# Patient Record
Sex: Male | Born: 1955 | Race: White | Hispanic: No | Marital: Married | Smoking: Never smoker
Health system: Southern US, Community
[De-identification: ages and names within clinical notes are randomized; demographics above are authoritative.]

## PROBLEM LIST (undated history)

## (undated) DIAGNOSIS — M109 Gout, unspecified: Secondary | ICD-10-CM

## (undated) DIAGNOSIS — Z87442 Personal history of urinary calculi: Secondary | ICD-10-CM

## (undated) DIAGNOSIS — E1129 Type 2 diabetes mellitus with other diabetic kidney complication: Secondary | ICD-10-CM

## (undated) DIAGNOSIS — K760 Fatty (change of) liver, not elsewhere classified: Secondary | ICD-10-CM

## (undated) DIAGNOSIS — E119 Type 2 diabetes mellitus without complications: Secondary | ICD-10-CM

## (undated) DIAGNOSIS — Z8669 Personal history of other diseases of the nervous system and sense organs: Secondary | ICD-10-CM

## (undated) DIAGNOSIS — K8000 Calculus of gallbladder with acute cholecystitis without obstruction: Secondary | ICD-10-CM

## (undated) DIAGNOSIS — E559 Vitamin D deficiency, unspecified: Secondary | ICD-10-CM

## (undated) HISTORY — DX: Personal history of urinary calculi: Z87.442

## (undated) HISTORY — DX: Type 2 diabetes mellitus without complications: E11.9

## (undated) HISTORY — DX: Personal history of other diseases of the nervous system and sense organs: Z86.69

## (undated) HISTORY — DX: Vitamin D deficiency, unspecified: E55.9

## (undated) HISTORY — PX: VARICOCELE EXCISION: SUR582

## (undated) HISTORY — DX: Type 2 diabetes mellitus with other diabetic kidney complication: E11.29

## (undated) HISTORY — PX: TOE SURGERY: SHX1073

## (undated) HISTORY — DX: Calculus of gallbladder with acute cholecystitis without obstruction: K80.00

## (undated) HISTORY — PX: NASAL SINUS SURGERY: SHX719

## (undated) HISTORY — DX: Gout, unspecified: M10.9

## (undated) HISTORY — DX: Fatty (change of) liver, not elsewhere classified: K76.0

---

## 1998-10-02 ENCOUNTER — Encounter: Payer: Self-pay | Admitting: Internal Medicine

## 1998-10-02 ENCOUNTER — Ambulatory Visit (HOSPITAL_COMMUNITY): Admission: RE | Admit: 1998-10-02 | Discharge: 1998-10-02 | Payer: Self-pay | Admitting: Internal Medicine

## 1999-05-12 ENCOUNTER — Emergency Department (HOSPITAL_COMMUNITY): Admission: EM | Admit: 1999-05-12 | Discharge: 1999-05-12 | Payer: Self-pay | Admitting: Emergency Medicine

## 1999-05-12 ENCOUNTER — Encounter: Payer: Self-pay | Admitting: Emergency Medicine

## 1999-05-30 ENCOUNTER — Encounter: Payer: Self-pay | Admitting: Endocrinology

## 1999-05-30 ENCOUNTER — Ambulatory Visit (HOSPITAL_COMMUNITY): Admission: RE | Admit: 1999-05-30 | Discharge: 1999-05-30 | Payer: Self-pay | Admitting: Endocrinology

## 1999-09-08 ENCOUNTER — Ambulatory Visit (HOSPITAL_COMMUNITY): Admission: RE | Admit: 1999-09-08 | Discharge: 1999-09-08 | Payer: Self-pay | Admitting: Internal Medicine

## 1999-09-08 ENCOUNTER — Encounter: Payer: Self-pay | Admitting: Internal Medicine

## 2000-03-16 ENCOUNTER — Ambulatory Visit (HOSPITAL_COMMUNITY): Admission: RE | Admit: 2000-03-16 | Discharge: 2000-03-16 | Payer: Self-pay | Admitting: Surgery

## 2000-03-16 ENCOUNTER — Encounter: Payer: Self-pay | Admitting: Surgery

## 2001-09-09 ENCOUNTER — Ambulatory Visit (HOSPITAL_COMMUNITY): Admission: RE | Admit: 2001-09-09 | Discharge: 2001-09-09 | Payer: Self-pay | Admitting: Internal Medicine

## 2001-09-09 ENCOUNTER — Encounter: Payer: Self-pay | Admitting: Internal Medicine

## 2001-09-26 ENCOUNTER — Ambulatory Visit (HOSPITAL_COMMUNITY): Admission: RE | Admit: 2001-09-26 | Discharge: 2001-09-26 | Payer: Self-pay | Admitting: *Deleted

## 2006-09-16 ENCOUNTER — Ambulatory Visit (HOSPITAL_COMMUNITY): Admission: RE | Admit: 2006-09-16 | Discharge: 2006-09-16 | Payer: Self-pay | Admitting: Internal Medicine

## 2007-06-30 ENCOUNTER — Encounter: Admission: RE | Admit: 2007-06-30 | Discharge: 2007-06-30 | Payer: Self-pay | Admitting: Internal Medicine

## 2009-02-27 ENCOUNTER — Emergency Department (HOSPITAL_COMMUNITY): Admission: EM | Admit: 2009-02-27 | Discharge: 2009-02-27 | Payer: Self-pay | Admitting: Emergency Medicine

## 2010-04-15 ENCOUNTER — Encounter: Admission: RE | Admit: 2010-04-15 | Discharge: 2010-04-15 | Payer: Self-pay | Admitting: Internal Medicine

## 2010-09-20 ENCOUNTER — Encounter: Payer: Self-pay | Admitting: Internal Medicine

## 2011-01-16 NOTE — Procedures (Signed)
[facility]  Patient:    Sean [name], Sean [name] Visit Number: [id number] MRN: [id number]          Service Type: END Location: ENDO Attending Physician:  Sabino Gasser Dictated by:   Sabino Gasser, M.D. Proc. Date: 09/26/01 Admit Date:  09/26/2001                             Procedure Report  PROCEDURE:  Colonoscopy.  INDICATION:  Colon cancer screening.  ANESTHESIA:  Demerol 80, Versed 9 mg.  DESCRIPTION OF PROCEDURE:  With the patient mildly sedated in the left lateral decubitus position, the Olympus videoscopic colonoscope was inserted into the rectum after a normal rectal exam and passed under direct vision to the cecum, identified by the ileocecal valve and appendiceal orifice, both of which were photographed.  From this point, the colonoscope was slowly withdrawn, taking circumferential views of the entire colonic mucosa, stopping in the rectum, which appeared normal on direct view and showed hemorrhoids on retroflexed view.  The endoscope was straightened and withdrawn.  The patients vital signs and pulse oximeter remained stable.  The patient tolerated the procedure well without apparent complications.  FINDINGS: 1. Internal hemorrhoids. 2. Otherwise unremarkable examination.  PLAN:  Repeat examination in five years. Dictated by:   Sabino Gasser, M.D. Attending Physician:  Sabino Gasser DD:  09/26/01 TD:  09/26/01 Job: [id number]

## 2011-04-17 ENCOUNTER — Other Ambulatory Visit: Payer: Self-pay | Admitting: Internal Medicine

## 2011-04-17 DIAGNOSIS — R519 Headache, unspecified: Secondary | ICD-10-CM

## 2011-04-22 ENCOUNTER — Ambulatory Visit
Admission: RE | Admit: 2011-04-22 | Discharge: 2011-04-22 | Disposition: A | Discharge status: Home / Self Care | Payer: BC Managed Care – PPO | Source: Ambulatory Visit | Attending: Internal Medicine | Admitting: Internal Medicine

## 2011-04-22 DIAGNOSIS — R519 Headache, unspecified: Secondary | ICD-10-CM

## 2011-04-22 MED ORDER — GADOBENATE DIMEGLUMINE 529 MG/ML IV SOLN
20.0000 mL | Freq: Once | INTRAVENOUS | Status: AC | PRN
  Administered 2011-04-22: 20 mL via INTRAVENOUS

## 2011-10-27 ENCOUNTER — Other Ambulatory Visit: Payer: Self-pay | Admitting: Internal Medicine

## 2011-10-27 ENCOUNTER — Ambulatory Visit
Admission: RE | Admit: 2011-10-27 | Discharge: 2011-10-27 | Disposition: A | Discharge status: Home / Self Care | Payer: BC Managed Care – PPO | Source: Ambulatory Visit | Attending: Internal Medicine | Admitting: Internal Medicine

## 2011-10-27 DIAGNOSIS — R1032 Left lower quadrant pain: Secondary | ICD-10-CM

## 2013-10-11 ENCOUNTER — Other Ambulatory Visit: Payer: Self-pay | Admitting: Physician Assistant

## 2013-11-15 ENCOUNTER — Other Ambulatory Visit: Payer: Self-pay | Admitting: Emergency Medicine

## 2013-11-20 ENCOUNTER — Encounter: Payer: Self-pay | Admitting: *Deleted

## 2013-11-20 DIAGNOSIS — Z8669 Personal history of other diseases of the nervous system and sense organs: Secondary | ICD-10-CM | POA: Insufficient documentation

## 2013-11-20 DIAGNOSIS — E1122 Type 2 diabetes mellitus with diabetic chronic kidney disease: Secondary | ICD-10-CM | POA: Insufficient documentation

## 2013-11-20 DIAGNOSIS — E559 Vitamin D deficiency, unspecified: Secondary | ICD-10-CM | POA: Insufficient documentation

## 2013-11-20 DIAGNOSIS — N182 Chronic kidney disease, stage 2 (mild): Secondary | ICD-10-CM

## 2013-11-21 ENCOUNTER — Encounter: Payer: Self-pay | Admitting: Emergency Medicine

## 2013-11-21 ENCOUNTER — Ambulatory Visit (INDEPENDENT_AMBULATORY_CARE_PROVIDER_SITE_OTHER): Payer: BC Managed Care – PPO | Admitting: Emergency Medicine

## 2013-11-21 VITALS — BP 126/82 | HR 80 | Temp 98.2°F | Resp 18 | Ht 75.0 in | Wt 217.0 lb

## 2013-11-21 DIAGNOSIS — R5381 Other malaise: Secondary | ICD-10-CM

## 2013-11-21 DIAGNOSIS — E782 Mixed hyperlipidemia: Secondary | ICD-10-CM

## 2013-11-21 DIAGNOSIS — R197 Diarrhea, unspecified: Secondary | ICD-10-CM

## 2013-11-21 DIAGNOSIS — E119 Type 2 diabetes mellitus without complications: Secondary | ICD-10-CM

## 2013-11-21 DIAGNOSIS — R5383 Other fatigue: Secondary | ICD-10-CM

## 2013-11-21 LAB — CBC WITH DIFFERENTIAL/PLATELET
Basophils Absolute: 0 10*3/uL (ref 0.0–0.1)
Basophils Relative: 1 % (ref 0–1)
EOS PCT: 2 % (ref 0–5)
Eosinophils Absolute: 0.1 10*3/uL (ref 0.0–0.7)
HCT: 44.3 % (ref 39.0–52.0)
Hemoglobin: 15.4 g/dL (ref 13.0–17.0)
LYMPHS ABS: 1.6 10*3/uL (ref 0.7–4.0)
Lymphocytes Relative: 39 % (ref 12–46)
MCH: 28.8 pg (ref 26.0–34.0)
MCHC: 34.8 g/dL (ref 30.0–36.0)
MCV: 83 fL (ref 78.0–100.0)
MONOS PCT: 15 % — AB (ref 3–12)
Monocytes Absolute: 0.6 10*3/uL (ref 0.1–1.0)
NEUTROS ABS: 1.7 10*3/uL (ref 1.7–7.7)
Neutrophils Relative %: 43 % (ref 43–77)
Platelets: 261 10*3/uL (ref 150–400)
RBC: 5.34 MIL/uL (ref 4.22–5.81)
RDW: 14 % (ref 11.5–15.5)
WBC: 4 10*3/uL (ref 4.0–10.5)

## 2013-11-21 LAB — BASIC METABOLIC PANEL WITH GFR
BUN: 18 mg/dL (ref 6–23)
CALCIUM: 9.6 mg/dL (ref 8.4–10.5)
CHLORIDE: 102 meq/L (ref 96–112)
CO2: 23 meq/L (ref 19–32)
Creat: 1.23 mg/dL (ref 0.50–1.35)
GFR, EST AFRICAN AMERICAN: 74 mL/min
GFR, Est Non African American: 64 mL/min
GLUCOSE: 106 mg/dL — AB (ref 70–99)
POTASSIUM: 4.2 meq/L (ref 3.5–5.3)
SODIUM: 135 meq/L (ref 135–145)

## 2013-11-21 LAB — HEMOGLOBIN A1C
Hgb A1c MFr Bld: 7.4 % — ABNORMAL HIGH (ref <5.7)
MEAN PLASMA GLUCOSE: 166 mg/dL — AB (ref <117)

## 2013-11-21 MED ORDER — CIPROFLOXACIN HCL 500 MG PO TABS: 500.0000 mg | ORAL_TABLET | Freq: Two times a day (BID) | ORAL | Status: AC

## 2013-11-21 NOTE — Progress Notes (Signed)
Subjective:    Patient ID: Sean [name], male    DOB: 30-Oct-1955, 58 y.o.   MRN: [id number]  HPI Comments: 58 yo male presents for 3 month F/U for Cholesterol, DM, D. Deficient. He has not been eating as healthy or exercising with recent loss of wife. He is not checking his BS. He checks feet routinely and denies skin break down or neuropathy increase.  He recently lost wife unexpectantly. He notes he is dealing okay with her death. He does occasionally have mild difficulty falling asleep with mild fatigue. He prefers no RX for depression or sleep. He is talking to friends and family but denies counseling.    He has had diarrhea since Friday, he feels a little better today. He has had decreased appetite with viral illness. He had fever on SUN. He had mild abdominal pain. He has not been taking any OTC.  Diabetes Associated symptoms include fatigue.      Medication List       This list is accurate as of: 11/21/13 11:23 AM.  Always use your most recent med list.               febuxostat 40 MG tablet  Commonly known as:  ULORIC  Take 40 mg by mouth daily.     fenofibrate micronized 134 MG capsule  Commonly known as:  LOFIBRA  TAKE ONE CAPSULE BY MOUTH DAILY     Fish Oil 1000 MG Caps  Take 1,000 mg by mouth daily.     metFORMIN 500 MG 24 hr tablet  Commonly known as:  GLUCOPHAGE-XR  Take 1,000 mg by mouth 2 (two) times daily.     naproxen 500 MG tablet  Commonly known as:  NAPROSYN  Take 500 mg by mouth 2 (two) times daily as needed.     Vitamin D (Cholecalciferol) 1000 UNITS Tabs  Take 1,000 mg by mouth daily.       Allergies  Allergen Reactions  . Valtrex [Valacyclovir Hcl]     Hives   Past Medical History  Diagnosis Date  . Vitamin D deficiency   . Diabetes mellitus without complication   . H/O cluster headache      Review of Systems  Constitutional: Positive for fatigue.  Gastrointestinal: Positive for abdominal pain and diarrhea.   Psychiatric/Behavioral: Positive for sleep disturbance.  All other systems reviewed and are negative.   BP 126/82  Pulse 80  Temp(Src) 98.2 F (36.8 C) (Temporal)  Resp 18  Ht 6\' 3"  (1.905 m)  Wt 217 lb (98.431 kg)  BMI 27.12 kg/m2     Objective:   Physical Exam  Nursing note and vitals reviewed. Constitutional: He is oriented to person, place, and time. He appears well-developed and well-nourished.  HENT:  Head: Normocephalic and atraumatic.  Right Ear: External ear normal.  Left Ear: External ear normal.  Nose: Nose normal.  Mouth/Throat: Oropharynx is clear and moist.  Eyes: Conjunctivae and EOM are normal.  Neck: Normal range of motion. Neck supple. No JVD present. No thyromegaly present.  Cardiovascular: Normal rate, regular rhythm, normal heart sounds and intact distal pulses.   Pulmonary/Chest: Effort normal and breath sounds normal.  Abdominal: Soft. Bowel sounds are normal. He exhibits no distension and no mass. There is tenderness. There is no rebound and no guarding.  diffuse  Musculoskeletal: Normal range of motion. He exhibits no edema and no tenderness.  Lymphadenopathy:    He has no cervical adenopathy.  Neurological: He is alert and  oriented to person, place, and time. He has normal reflexes. No cranial nerve deficit. Coordination normal.  Skin: Skin is warm and dry.  DRY Skin both feet  Psychiatric: He has a normal mood and affect. His behavior is normal. Judgment and thought content normal.  Tearful but appropriate.          Assessment & Plan:  1.  3 month F/U for Cholesterol,DM, D. Deficient. Needs healthy diet, cardio QD and obtain healthy weight. Check Labs, Check BP if >130/80 call office, Check BS if >200 call office. dry skin DM- Advised needs podiatry evaluation with  DM Hx, epsom salt soaks, vaseline treatment QD, monitor QD and call with any concerns/ adverse changes  2. Diarrhea- ? Virus vs colitis- Cipro 500 mg AD, Florastor Probiotics AD  SX #10, w/c if SX increase or ER. Bland diet x 1 week, push fluids  3. mild depression vs Fatigue- check labs, increase activity and H2O, declines Rx for depression, advised counseling, w/c if sx increase. Sleep hygiene discussed

## 2013-11-21 NOTE — Patient Instructions (Signed)
Depression, Adult Depression is feeling sad, low, down in the dumps, blue, gloomy, or empty. In general, there are two kinds of depression:  Normal sadness or grief. This can happen after something upsetting. It often goes away on its own within 2 weeks. After losing a loved one (bereavement), normal sadness and grief may last longer than two weeks. It usually gets better with time.  Clinical depression. This kind lasts longer than normal sadness or grief. It keeps you from doing the things you normally do in life. It is often hard to function at home, work, or at school. It may affect your relationships with others. Treatment is often needed. GET HELP RIGHT AWAY IF:  You have thoughts about hurting yourself or others.  You lose touch with reality (psychotic symptoms). You may:  See or hear things that are not real.  Have untrue beliefs about your life or people around you.  Your medicine is giving you problems. MAKE SURE YOU:  Understand these instructions.  Will watch your condition.  Will get help right away if you are not doing well or get worse. Document Released: 09/19/2010 Document Revised: 05/11/2012 Document Reviewed: 12/17/2011 [facility] Patient Information 2014 [place], [place]. Dehydration, Adult Dehydration means your body does not have as much fluid as it needs. Your kidneys, brain, and heart will not work properly without the right amount of fluids and salt.  HOME CARE  Ask your doctor how to replace body fluid losses (rehydrate).  Drink enough fluids to keep your pee (urine) clear or pale yellow.  Drink small amounts of fluids often if you feel sick to your stomach (nauseous) or throw up (vomit).  Eat like you normally do.  Avoid:  Foods or drinks high in sugar.  Bubbly (carbonated) drinks.  Juice.  Very hot or cold fluids.  Drinks with caffeine.  Fatty, greasy foods.  Alcohol.  Tobacco.  Eating too much.  Gelatin desserts.  Wash your hands to  avoid spreading germs (bacteria, viruses).  Only take medicine as told by your doctor.  Keep all doctor visits as told. GET HELP RIGHT AWAY IF:   You cannot drink something without throwing up.  You get worse even with treatment.  Your vomit has blood in it or looks greenish.  Your poop (stool) has blood in it or looks black and tarry.  You have not peed in 6 to 8 hours.  You pee a small amount of very dark pee.  You have a fever.  You pass out (faint).  You have belly (abdominal) pain that gets worse or stays in one spot (localizes).  You have a rash, stiff neck, or bad headache.  You get easily annoyed, sleepy, or are hard to wake up.  You feel weak, dizzy, or very thirsty. MAKE SURE YOU:   Understand these instructions.  Will watch your condition.  Will get help right away if you are not doing well or get worse. Document Released: 06/13/2009 Document Revised: 11/09/2011 Document Reviewed: 04/06/2011 [facility] Patient Information 2014 [place], [place]. Viral Gastroenteritis Viral gastroenteritis is also known as stomach flu. This condition affects the stomach and intestinal tract. It can cause sudden diarrhea and vomiting. The illness typically lasts 3 to 8 days. Most people develop an immune response that eventually gets rid of the virus. While this natural response develops, the virus can make you quite ill. CAUSES  Many different viruses can cause gastroenteritis, such as rotavirus or noroviruses. You can catch one of these viruses by consuming contaminated  food or water. You may also catch a virus by sharing utensils or other personal items with an infected person or by touching a contaminated surface. SYMPTOMS  The most common symptoms are diarrhea and vomiting. These problems can cause a severe loss of body fluids (dehydration) and a body salt (electrolyte) imbalance. Other symptoms may include:  Fever.  Headache.  Fatigue.  Abdominal pain. DIAGNOSIS  Your  caregiver can usually diagnose viral gastroenteritis based on your symptoms and a physical exam. A stool sample may also be taken to test for the presence of viruses or other infections. TREATMENT  This illness typically goes away on its own. Treatments are aimed at rehydration. The most serious cases of viral gastroenteritis involve vomiting so severely that you are not able to keep fluids down. In these cases, fluids must be given through an intravenous line (IV). HOME CARE INSTRUCTIONS   Drink enough fluids to keep your urine clear or pale yellow. Drink small amounts of fluids frequently and increase the amounts as tolerated.  Ask your caregiver for specific rehydration instructions.  Avoid:  Foods high in sugar.  Alcohol.  Carbonated drinks.  Tobacco.  Juice.  Caffeine drinks.  Extremely hot or cold fluids.  Fatty, greasy foods.  Too much intake of anything at one time.  Dairy products until 24 to 48 hours after diarrhea stops.  You may consume probiotics. Probiotics are active cultures of beneficial bacteria. They may lessen the amount and number of diarrheal stools in adults. Probiotics can be found in yogurt with active cultures and in supplements.  Wash your hands well to avoid spreading the virus.  Only take over-the-counter or prescription medicines for pain, discomfort, or fever as directed by your caregiver. Do not give aspirin to children. Antidiarrheal medicines are not recommended.  Ask your caregiver if you should continue to take your regular prescribed and over-the-counter medicines.  Keep all follow-up appointments as directed by your caregiver. SEEK IMMEDIATE MEDICAL CARE IF:   You are unable to keep fluids down.  You do not urinate at least once every 6 to 8 hours.  You develop shortness of breath.  You notice blood in your stool or vomit. This may look like coffee grounds.  You have abdominal pain that increases or is concentrated in one small  area (localized).  You have persistent vomiting or diarrhea.  You have a fever.  The patient is a child younger than 3 months, and he or she has a fever.  The patient is a child older than 3 months, and he or she has a fever and persistent symptoms.  The patient is a child older than 3 months, and he or she has a fever and symptoms suddenly get worse.  The patient is a baby, and he or she has no tears when crying. MAKE SURE YOU:   Understand these instructions.  Will watch your condition.  Will get help right away if you are not doing well or get worse. Document Released: 08/17/2005 Document Revised: 11/09/2011 Document Reviewed: 06/03/2011 [facility] Patient Information 2014 [place], [place]. Diabetes and Foot Care Diabetes may cause you to have problems because of poor blood supply (circulation) to your feet and legs. This may cause the skin on your feet to become thinner, break easier, and heal more slowly. Your skin may become dry, and the skin may peel and crack. You may also have nerve damage in your legs and feet causing decreased feeling in them. You may not notice minor injuries to  your feet that could lead to infections or more serious problems. Taking care of your feet is one of the most important things you can do for yourself.  HOME CARE INSTRUCTIONS  Wear shoes at all times, even in the house. Do not go barefoot. Bare feet are easily injured.  Check your feet daily for blisters, cuts, and redness. If you cannot see the bottom of your feet, use a mirror or ask someone for help.  Wash your feet with warm water (do not use hot water) and mild soap. Then pat your feet and the areas between your toes until they are completely dry. Do not soak your feet as this can dry your skin.  Apply a moisturizing lotion or petroleum jelly (that does not contain alcohol and is unscented) to the skin on your feet and to dry, brittle toenails. Do not apply lotion between your toes.  Trim your  toenails straight across. Do not dig under them or around the cuticle. File the edges of your nails with an emery board or nail file.  Do not cut corns or calluses or try to remove them with medicine.  Wear clean socks or stockings every day. Make sure they are not too tight. Do not wear knee-high stockings since they may decrease blood flow to your legs.  Wear shoes that fit properly and have enough cushioning. To break in new shoes, wear them for just a few hours a day. This prevents you from injuring your feet. Always look in your shoes before you put them on to be sure there are no objects inside.  Do not cross your legs. This may decrease the blood flow to your feet.  If you find a minor scrape, cut, or break in the skin on your feet, keep it and the skin around it clean and dry. These areas may be cleansed with mild soap and water. Do not cleanse the area with peroxide, alcohol, or iodine.  When you remove an adhesive bandage, be sure not to damage the skin around it.  If you have a wound, look at it several times a day to make sure it is healing.  Do not use heating pads or hot water bottles. They may burn your skin. If you have lost feeling in your feet or legs, you may not know it is happening until it is too late.  Make sure your health care provider performs a complete foot exam at least annually or more often if you have foot problems. Report any cuts, sores, or bruises to your health care provider immediately. SEEK MEDICAL CARE IF:   You have an injury that is not healing.  You have cuts or breaks in the skin.  You have an ingrown nail.  You notice redness on your legs or feet.  You feel burning or tingling in your legs or feet.  You have pain or cramps in your legs and feet.  Your legs or feet are numb.  Your feet always feel cold. SEEK IMMEDIATE MEDICAL CARE IF:   There is increasing redness, swelling, or pain in or around a wound.  There is a red line that goes  up your leg.  Pus is coming from a wound.  You develop a fever or as directed by your health care provider.  You notice a bad smell coming from an ulcer or wound. Document Released: 08/14/2000 Document Revised: 04/19/2013 Document Reviewed: 01/24/2013 [facility] Patient Information 2014 [place], [place].

## 2013-11-22 LAB — HEPATIC FUNCTION PANEL
ALBUMIN: 4.6 g/dL (ref 3.5–5.2)
ALT: 38 U/L (ref 0–53)
AST: 28 U/L (ref 0–37)
Alkaline Phosphatase: 80 U/L (ref 39–117)
BILIRUBIN DIRECT: 0.1 mg/dL (ref 0.0–0.3)
Indirect Bilirubin: 0.3 mg/dL (ref 0.2–1.2)
Total Bilirubin: 0.4 mg/dL (ref 0.2–1.2)
Total Protein: 7 g/dL (ref 6.0–8.3)

## 2013-11-22 LAB — LIPID PANEL
Cholesterol: 108 mg/dL (ref 0–200)
HDL: 24 mg/dL — AB (ref >39)
LDL CALC: 50 mg/dL (ref 0–99)
Total CHOL/HDL Ratio: 4.5 Ratio
Triglycerides: 170 mg/dL — ABNORMAL HIGH (ref <150)
VLDL: 34 mg/dL (ref 0–40)

## 2013-11-22 LAB — TSH: TSH: 1.966 u[IU]/mL (ref 0.350–4.500)

## 2013-11-22 LAB — INSULIN, FASTING: Insulin fasting, serum: 10 u[IU]/mL (ref 3–28)

## 2013-11-23 ENCOUNTER — Telehealth: Payer: Self-pay

## 2013-11-23 NOTE — Telephone Encounter (Signed)
Message copied by Joya MartyrZMENT, Hanley Rispoli M on Thu Nov 23, 2013  9:07 AM ------      Message from: Loree FeeSMITH, MELISSA R      Created: Wed Nov 22, 2013  4:48 PM       HDL (good cholesterol) is low need to increase cardiovascular exercise to 30 minutes daily. Triglycerides elevated decrease Carbohydrates including alcohol and fried foods. Add Fish oil 1000mg .A1c (3 month blood sugar average) elevated need decreased Carbohydrates, increase Cardio, and maintain healthy weight. Needs recheck 3 month MCK       ------

## 2013-11-23 NOTE — Telephone Encounter (Signed)
lmom to pt to return my call for lab results. 

## 2013-12-11 ENCOUNTER — Other Ambulatory Visit: Payer: Self-pay | Admitting: Emergency Medicine

## 2013-12-11 ENCOUNTER — Telehealth: Payer: Self-pay | Admitting: *Deleted

## 2013-12-11 MED ORDER — ALPRAZOLAM 0.25 MG PO TABS: 0.2500 mg | ORAL_TABLET | Freq: Two times a day (BID) | ORAL | Status: DC | PRN

## 2013-12-11 NOTE — Telephone Encounter (Signed)
Patient wants Rx for both Depression and sleep.  States he wants "something mild".

## 2013-12-11 NOTE — Telephone Encounter (Signed)
Please call and ask patient does he want to start medicine for sleep or depression or both

## 2013-12-11 NOTE — Telephone Encounter (Signed)
Pt is calling said he had discussed with you about sleep & hes calling but really wants to talk to you before we call something in.    [facility] [address]

## 2013-12-14 ENCOUNTER — Telehealth: Payer: Self-pay | Admitting: Internal Medicine

## 2013-12-17 ENCOUNTER — Other Ambulatory Visit: Payer: Self-pay | Admitting: Physician Assistant

## 2014-01-11 NOTE — Telephone Encounter (Signed)
ERROR

## 2014-02-27 ENCOUNTER — Ambulatory Visit (INDEPENDENT_AMBULATORY_CARE_PROVIDER_SITE_OTHER): Payer: BC Managed Care – PPO | Admitting: Emergency Medicine

## 2014-02-27 ENCOUNTER — Encounter: Payer: Self-pay | Admitting: Internal Medicine

## 2014-02-27 VITALS — BP 122/82 | HR 76 | Temp 99.0°F | Resp 16 | Ht 75.0 in | Wt 222.4 lb

## 2014-02-27 DIAGNOSIS — E782 Mixed hyperlipidemia: Secondary | ICD-10-CM

## 2014-02-27 DIAGNOSIS — M109 Gout, unspecified: Secondary | ICD-10-CM

## 2014-02-27 DIAGNOSIS — E119 Type 2 diabetes mellitus without complications: Secondary | ICD-10-CM

## 2014-02-27 LAB — CBC WITH DIFFERENTIAL/PLATELET
BASOS ABS: 0 10*3/uL (ref 0.0–0.1)
Basophils Relative: 0 % (ref 0–1)
Eosinophils Absolute: 0.3 10*3/uL (ref 0.0–0.7)
Eosinophils Relative: 5 % (ref 0–5)
HCT: 41.3 % (ref 39.0–52.0)
HEMOGLOBIN: 14.2 g/dL (ref 13.0–17.0)
LYMPHS ABS: 2.3 10*3/uL (ref 0.7–4.0)
Lymphocytes Relative: 35 % (ref 12–46)
MCH: 28.6 pg (ref 26.0–34.0)
MCHC: 34.4 g/dL (ref 30.0–36.0)
MCV: 83.1 fL (ref 78.0–100.0)
MONOS PCT: 8 % (ref 3–12)
Monocytes Absolute: 0.5 10*3/uL (ref 0.1–1.0)
Neutro Abs: 3.4 10*3/uL (ref 1.7–7.7)
Neutrophils Relative %: 52 % (ref 43–77)
PLATELETS: 249 10*3/uL (ref 150–400)
RBC: 4.97 MIL/uL (ref 4.22–5.81)
RDW: 14.2 % (ref 11.5–15.5)
WBC: 6.5 10*3/uL (ref 4.0–10.5)

## 2014-02-27 LAB — LIPID PANEL
Cholesterol: 127 mg/dL (ref 0–200)
HDL: 33 mg/dL — AB (ref >39)
LDL CALC: 68 mg/dL (ref 0–99)
Total CHOL/HDL Ratio: 3.8 Ratio
Triglycerides: 130 mg/dL (ref <150)
VLDL: 26 mg/dL (ref 0–40)

## 2014-02-27 LAB — HEPATIC FUNCTION PANEL
ALT: 30 U/L (ref 0–53)
AST: 30 U/L (ref 0–37)
Albumin: 4.7 g/dL (ref 3.5–5.2)
Alkaline Phosphatase: 59 U/L (ref 39–117)
BILIRUBIN DIRECT: 0.1 mg/dL (ref 0.0–0.3)
BILIRUBIN TOTAL: 0.5 mg/dL (ref 0.2–1.2)
Indirect Bilirubin: 0.4 mg/dL (ref 0.2–1.2)
TOTAL PROTEIN: 7.3 g/dL (ref 6.0–8.3)

## 2014-02-27 LAB — BASIC METABOLIC PANEL WITH GFR
BUN: 22 mg/dL (ref 6–23)
CHLORIDE: 103 meq/L (ref 96–112)
CO2: 25 meq/L (ref 19–32)
CREATININE: 1.24 mg/dL (ref 0.50–1.35)
Calcium: 9.6 mg/dL (ref 8.4–10.5)
GFR, EST NON AFRICAN AMERICAN: 63 mL/min
GFR, Est African American: 73 mL/min
Glucose, Bld: 92 mg/dL (ref 70–99)
Potassium: 4.3 mEq/L (ref 3.5–5.3)
Sodium: 138 mEq/L (ref 135–145)

## 2014-02-27 LAB — URIC ACID: Uric Acid, Serum: 3.8 mg/dL — ABNORMAL LOW (ref 4.0–7.8)

## 2014-02-27 LAB — HEMOGLOBIN A1C
HEMOGLOBIN A1C: 6.6 % — AB (ref <5.7)
MEAN PLASMA GLUCOSE: 143 mg/dL — AB (ref <117)

## 2014-02-27 NOTE — Progress Notes (Signed)
Subjective:    Patient ID: Sean [name], male    DOB: 02/06/1956, 58 y.o.   MRN: [id number]  HPI Comments: 58 yo WM presents for 3 month F/U for  Cholesterol, DM, D. Deficient. He keeps busy and walks almost daily. He is eating healthy for mot part. He has restarted medicine AD and is doing ok. BS has been 120s. He checks feet routinely and denies skin break down or neuropathy increase.   He notes he is dealing with depression with loss of wife better. He notes he was unable to tolerate depression medicine. He has not started counseling but does not feel he needs it at this time.   He needs Uric acid level checked for maintenace for Rheumatology and denies recent flares.  WBC             4.0   11/21/2013 HGB            15.4   11/21/2013 HCT            44.3   11/21/2013 PLT             261   11/21/2013 GLUCOSE         106   11/21/2013 CHOL            108   11/21/2013 TRIG            170   11/21/2013 HDL              24   11/21/2013 LDLCALC          50   11/21/2013 ALT              38   11/21/2013 AST              28   11/21/2013 NA              135   11/21/2013 K               4.2   11/21/2013 CL              102   11/21/2013 CREATININE     1.23   11/21/2013 BUN              18   11/21/2013 CO2              23   11/21/2013 TSH           1.966   11/21/2013 HGBA1C          7.4   11/21/2013   Diabetes  Hypertension      Medication List       This list is accurate as of: 02/27/14 12:23 PM.  Always use your most recent med list.               febuxostat 40 MG tablet  Commonly known as:  ULORIC  Take 40 mg by mouth daily.     fenofibrate micronized 134 MG capsule  Commonly known as:  LOFIBRA  TAKE ONE CAPSULE BY MOUTH DAILY     Fish Oil 1000 MG Caps  Take 1,000 mg by mouth daily.     metFORMIN 500 MG 24 hr tablet  Commonly known as:  GLUCOPHAGE-XR  TAKE 2 TABLETS BY MOUTH TWICE A DAY     naproxen 500 MG tablet  Commonly known as:  NAPROSYN  Take 500 mg by mouth 2 (two) times  daily as needed.  Vitamin D (Cholecalciferol) 1000 UNITS Tabs  Take 1,000 mg by mouth daily.       Allergies  Allergen Reactions  . Valtrex [Valacyclovir Hcl]     Hives   Past Medical History  Diagnosis Date  . Vitamin D deficiency   . Diabetes mellitus without complication   . H/O cluster headache      Review of Systems  All other systems reviewed and are negative.  BP 122/82  Pulse 76  Temp(Src) 99 F (37.2 C) (Temporal)  Resp 16  Ht 6\' 3"  (1.905 m)  Wt 222 lb 6.4 oz (100.88 kg)  BMI 27.80 kg/m2     Objective:   Physical Exam  Nursing note and vitals reviewed. Constitutional: He is oriented to person, place, and time. He appears well-developed and well-nourished.  HENT:  Head: Normocephalic and atraumatic.  Right Ear: External ear normal.  Left Ear: External ear normal.  Nose: Nose normal.  Mouth/Throat: No oropharyngeal exudate.  Eyes: Conjunctivae are normal.  Neck: Normal range of motion.  Cardiovascular: Normal rate, regular rhythm, normal heart sounds and intact distal pulses.   Pulmonary/Chest: Effort normal and breath sounds normal.  Abdominal: Soft.  Musculoskeletal: Normal range of motion.  Lymphadenopathy:    He has no cervical adenopathy.  Neurological: He is alert and oriented to person, place, and time.  Skin: Skin is warm and dry.  Exposed area  Psychiatric: He has a normal mood and affect. Judgment normal.          Assessment & Plan:  1.  3 month F/U for Cholesterol,DM, D. Deficient. Needs healthy diet, cardio QD and obtain healthy weight. Check Labs, Check BP if >130/80 call office, Check BS if >200 call office   2.  Gout- Check labs, Continue RX AD   3. Grief counseling thru hospice advised if needs in future

## 2014-03-18 ENCOUNTER — Other Ambulatory Visit: Payer: Self-pay | Admitting: Emergency Medicine

## 2014-05-15 ENCOUNTER — Encounter: Payer: Self-pay | Admitting: Physician Assistant

## 2014-05-15 ENCOUNTER — Encounter: Payer: Self-pay | Admitting: Internal Medicine

## 2014-07-07 ENCOUNTER — Other Ambulatory Visit: Payer: Self-pay | Admitting: Emergency Medicine

## 2014-07-10 ENCOUNTER — Other Ambulatory Visit: Payer: Self-pay | Admitting: Emergency Medicine

## 2014-07-11 ENCOUNTER — Other Ambulatory Visit: Payer: Self-pay | Admitting: Internal Medicine

## 2014-07-17 ENCOUNTER — Ambulatory Visit (INDEPENDENT_AMBULATORY_CARE_PROVIDER_SITE_OTHER): Payer: BC Managed Care – PPO | Admitting: Internal Medicine

## 2014-07-17 ENCOUNTER — Encounter: Payer: Self-pay | Admitting: Internal Medicine

## 2014-07-17 VITALS — BP 132/84 | HR 76 | Temp 97.7°F | Resp 16 | Ht 75.0 in | Wt 230.0 lb

## 2014-07-17 DIAGNOSIS — Z0001 Encounter for general adult medical examination with abnormal findings: Secondary | ICD-10-CM

## 2014-07-17 DIAGNOSIS — E785 Hyperlipidemia, unspecified: Secondary | ICD-10-CM | POA: Insufficient documentation

## 2014-07-17 DIAGNOSIS — M109 Gout, unspecified: Secondary | ICD-10-CM

## 2014-07-17 DIAGNOSIS — M1000 Idiopathic gout, unspecified site: Secondary | ICD-10-CM

## 2014-07-17 DIAGNOSIS — Z111 Encounter for screening for respiratory tuberculosis: Secondary | ICD-10-CM

## 2014-07-17 DIAGNOSIS — Z125 Encounter for screening for malignant neoplasm of prostate: Secondary | ICD-10-CM

## 2014-07-17 DIAGNOSIS — E782 Mixed hyperlipidemia: Secondary | ICD-10-CM

## 2014-07-17 DIAGNOSIS — Z79899 Other long term (current) drug therapy: Secondary | ICD-10-CM

## 2014-07-17 DIAGNOSIS — R945 Abnormal results of liver function studies: Secondary | ICD-10-CM

## 2014-07-17 DIAGNOSIS — E1121 Type 2 diabetes mellitus with diabetic nephropathy: Secondary | ICD-10-CM

## 2014-07-17 DIAGNOSIS — R6889 Other general symptoms and signs: Secondary | ICD-10-CM

## 2014-07-17 DIAGNOSIS — I10 Essential (primary) hypertension: Secondary | ICD-10-CM | POA: Insufficient documentation

## 2014-07-17 DIAGNOSIS — Z23 Encounter for immunization: Secondary | ICD-10-CM

## 2014-07-17 DIAGNOSIS — Z1212 Encounter for screening for malignant neoplasm of rectum: Secondary | ICD-10-CM

## 2014-07-17 DIAGNOSIS — E1169 Type 2 diabetes mellitus with other specified complication: Secondary | ICD-10-CM | POA: Insufficient documentation

## 2014-07-17 DIAGNOSIS — E559 Vitamin D deficiency, unspecified: Secondary | ICD-10-CM

## 2014-07-17 DIAGNOSIS — R7989 Other specified abnormal findings of blood chemistry: Secondary | ICD-10-CM

## 2014-07-17 DIAGNOSIS — Z113 Encounter for screening for infections with a predominantly sexual mode of transmission: Secondary | ICD-10-CM

## 2014-07-17 HISTORY — DX: Gout, unspecified: M10.9

## 2014-07-17 LAB — CBC WITH DIFFERENTIAL/PLATELET
Basophils Absolute: 0 K/uL (ref 0.0–0.1)
Basophils Relative: 0 % (ref 0–1)
Eosinophils Absolute: 0.3 K/uL (ref 0.0–0.7)
Eosinophils Relative: 5 % (ref 0–5)
HCT: 41.7 % (ref 39.0–52.0)
Hemoglobin: 14.3 g/dL (ref 13.0–17.0)
Lymphocytes Relative: 30 % (ref 12–46)
Lymphs Abs: 2 K/uL (ref 0.7–4.0)
MCH: 29 pg (ref 26.0–34.0)
MCHC: 34.3 g/dL (ref 30.0–36.0)
MCV: 84.6 fL (ref 78.0–100.0)
MPV: 11.3 fL (ref 9.4–12.4)
Monocytes Absolute: 0.5 K/uL (ref 0.1–1.0)
Monocytes Relative: 7 % (ref 3–12)
Neutro Abs: 3.9 K/uL (ref 1.7–7.7)
Neutrophils Relative %: 58 % (ref 43–77)
Platelets: 313 K/uL (ref 150–400)
RBC: 4.93 MIL/uL (ref 4.22–5.81)
RDW: 13.6 % (ref 11.5–15.5)
WBC: 6.8 K/uL (ref 4.0–10.5)

## 2014-07-17 LAB — HEMOGLOBIN A1C
Hgb A1c MFr Bld: 7.4 % — ABNORMAL HIGH (ref <5.7)
Mean Plasma Glucose: 166 mg/dL — ABNORMAL HIGH (ref <117)

## 2014-07-17 NOTE — Patient Instructions (Signed)

## 2014-07-17 NOTE — Progress Notes (Signed)
Patient ID: Sean [name], male   DOB: [birth year], 58 y.o.   MRN: [id number]  Annual Screening Comprehensive Examination  This very nice 58 y.o.WWM presents for complete physical.  Patient has been followed for HTN, T2_NIDDM  w/CKD2, Hyperlipidemia, and Vitamin D Deficiency. Patient had low Testerone 208 in 2010. He also was started on uloric for hyperuricemia.   Labile HTN predates since  [id number] and sometimes as as high as 158/82 and he has been monitored expectantly.  Patient's BP has been controlled at home.Today's BP: 132/84 mmHg. Patient denies any cardiac symptoms as chest pain, palpitations, shortness of breath, dizziness or ankle swelling.   Patient's hyperlipidemia is controlled with diet and medications. Patient denies myalgias or other medication SE's. Last lipids were at goal - Total Chol 127; HDL 33; LDL 68; Trig 130 on 02/27/2014.   Patient has T2_NIDDM w/CKD2 (GFR 63 ml/min) since but wasn't started on Metformin until Mar 2014 and patient denies reactive hypoglycemic symptoms, visual blurring, diabetic polys or paresthesias. Last A1c was  6.6% on 02/27/2014.     The patient ha shx/o hout / hyperuricemia & was treated with allopurinol for a period of time and self d/c'd th re medicie and was later seen by Dr Nickola MajorHawkes and started on Uloric.  Finally, patient has history of Vitamin D Deficiency of 34 in 2012   and last vitamin D was 70 in Sept 2014.   Medication Sig  . fenofibrate  134 MG cap TAKE ONE CAP DAILY  . metFORMIN -XR 500 MG 24 hr tab TAKE 2 TAB  TWICE A DAY  . naproxen (NAPROSYN) 500 MG tablet Take  2  times daily as needed.  Marland Kitchen. FISH OIL 1000 MG CAPS Take 1,000 mg by mouth daily.  . Vitamin D 1000 UNITS TABS Take 1,000 mg by mouth daily.   Allergies  Allergen Reactions  . Valtrex [Valacyclovir Hcl]     Hives   Past Medical History  Diagnosis Date  . Vitamin D deficiency   . Diabetes mellitus without complication   . H/O cluster headache    Health Maintenance  Topic  Date Due  . PNEUMOCOCCAL POLYSACCHARIDE VACCINE (1) 04/26/1956  . OPHTHALMOLOGY EXAM  04/26/1964  . URINE MICROALBUMIN  04/26/1964  . TETANUS/TDAP  04/26/1973  . COLONOSCOPY  04/26/2004  . ZOSTAVAX  04/26/2014  . HEMOGLOBIN A1C  08/29/2014  . FOOT EXAM  11/23/2014  . INFLUENZA VACCINE  04/01/2015   Immunization History  Administered Date(s) Administered  . Influenza Split 05/15/2013, 07/17/2014  . PPD Test 07/17/2014   Family History  Problem Relation Age of Onset  . Hypertension Father   . Diabetes Father   . Heart attack Father    History   Social History  . Marital Status: Widowed    Spouse Name: N/A    Number of Children: N/A  . Years of Education: N/A   Occupational History  . 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 Smoking status: Never Smoker   . Smokeless tobacco: Not on file  . Alcohol Use: No  . Drug Use: No  . Sexual Activity: Not on file    ROS Constitutional: Denies fever, chills, weight loss/gain, headaches, insomnia, fatigue, night sweats or change in appetite. Eyes: Denies redness, blurred vision, diplopia, discharge, itchy or watery eyes.  ENT: Denies discharge, congestion, post nasal drip, epistaxis, sore throat, earache, hearing loss, dental pain, Tinnitus, Vertigo, Sinus pain or snoring.  Cardio:  Denies chest pain, palpitations, irregular heartbeat, syncope, dyspnea, diaphoresis, orthopnea, PND, claudication or edema Respiratory: denies cough, dyspnea, DOE, pleurisy, hoarseness, laryngitis or wheezing.  Gastrointestinal: Denies dysphagia, heartburn, reflux, water brash, pain, cramps, nausea, vomiting, bloating, diarrhea, constipation, hematemesis, melena, hematochezia, jaundice or hemorrhoids Genitourinary: Denies dysuria, frequency, urgency, nocturia, hesitancy, discharge, hematuria or flank pain Musculoskeletal: Denies arthralgia, myalgia, stiffness, Jt. Swelling, pain, limp or strain/sprain. Denies  Falls. Skin: Denies puritis, rash, hives, warts, acne, eczema or change in skin lesion Neuro: No weakness, tremor, incoordination, spasms, paresthesia or pain Psychiatric: Denies confusion, memory loss or sensory loss. Denies Depression. Endocrine: Denies change in weight, skin, hair change, nocturia, and paresthesia, diabetic polys, visual blurring or hyper / hypo glycemic episodes.  Heme/Lymph: No excessive bleeding, bruising or enlarged lymph nodes.  Physical Exam  BP 132/84   Pulse 76  Temp 97.7 F   Resp 16  Ht 6\' 3"    Wt 230 lb   BMI 28.75   General Appearance: Well nourished, in no apparent distress. Eyes: PERRLA, EOMs, conjunctiva no swelling or erythema, normal fundi and vessels. Sinuses: No frontal/maxillary tenderness ENT/Mouth: EACs patent / TMs  nl. Nares clear without erythema, swelling, mucoid exudates. Oral hygiene is good. No erythema, swelling, or exudate. Tongue normal, non-obstructing. Tonsils not swollen or erythematous. Hearing normal.  Neck: Supple, thyroid normal. No bruits, nodes or JVD. Respiratory: Respiratory effort normal.  BS equal and clear bilateral without rales, rhonci, wheezing or stridor. Cardio: Heart sounds are normal with regular rate and rhythm and no murmurs, rubs or gallops. Peripheral pulses are normal and equal bilaterally without edema. No aortic or femoral bruits. Chest: symmetric with normal excursions and percussion.  Abdomen: Flat, soft, with bowl sounds. Nontender, no guarding, rebound, hernias, masses, or organomegaly.  Lymphatics: Non tender without lymphadenopathy.  Genitourinary: No hernias.Testes nl. DRE - prostate nl for age - smooth & firm w/o nodules. Musculoskeletal: Full ROM all peripheral extremities, joint stability, 5/5 strength, and normal gait. Skin: Warm and dry without rashes, lesions, cyanosis, clubbing or  ecchymosis.  Neuro: Cranial nerves intact, reflexes equal bilaterally. Normal muscle tone, no cerebellar symptoms.  Sensation intact.  Pysch: Awake and oriented X 3with normal affect, insight and judgment appropriate.   Assessment and Plan  1. Annual Screening Examination 2. Hypertension  3. Hyperlipidemia 4. T2_NIDDM w/CKD2 5. Vitamin D Deficiency 6. Gout    Continue prudent diet as discussed, weight control, BP monitoring, regular exercise, and medications as discussed.  Discussed med effects and SE's. Routine screening labs and tests as requested with regular follow-up as recommended.

## 2014-07-18 LAB — LIPID PANEL
CHOL/HDL RATIO: 3.7 ratio
Cholesterol: 116 mg/dL (ref 0–200)
HDL: 31 mg/dL — AB (ref >39)
LDL Cholesterol: 54 mg/dL (ref 0–99)
TRIGLYCERIDES: 157 mg/dL — AB (ref <150)
VLDL: 31 mg/dL (ref 0–40)

## 2014-07-18 LAB — URINALYSIS, MICROSCOPIC ONLY
Bacteria, UA: NONE SEEN
CASTS: NONE SEEN
Crystals: NONE SEEN
Squamous Epithelial / LPF: NONE SEEN

## 2014-07-18 LAB — MICROALBUMIN / CREATININE URINE RATIO
CREATININE, URINE: 162.6 mg/dL
MICROALB UR: 0.3 mg/dL (ref <2.0)
MICROALB/CREAT RATIO: 1.8 mg/g (ref 0.0–30.0)

## 2014-07-18 LAB — MAGNESIUM: MAGNESIUM: 1.9 mg/dL (ref 1.5–2.5)

## 2014-07-18 LAB — HEPATIC FUNCTION PANEL
ALT: 34 U/L (ref 0–53)
AST: 25 U/L (ref 0–37)
Albumin: 4.8 g/dL (ref 3.5–5.2)
Alkaline Phosphatase: 61 U/L (ref 39–117)
BILIRUBIN DIRECT: 0.1 mg/dL (ref 0.0–0.3)
BILIRUBIN TOTAL: 0.4 mg/dL (ref 0.2–1.2)
Indirect Bilirubin: 0.3 mg/dL (ref 0.2–1.2)
Total Protein: 7.5 g/dL (ref 6.0–8.3)

## 2014-07-18 LAB — TESTOSTERONE: Testosterone: 215 ng/dL — ABNORMAL LOW (ref 300–890)

## 2014-07-18 LAB — TSH: TSH: 1.731 u[IU]/mL (ref 0.350–4.500)

## 2014-07-18 LAB — VITAMIN D 25 HYDROXY (VIT D DEFICIENCY, FRACTURES): VIT D 25 HYDROXY: 36 ng/mL (ref 30–100)

## 2014-07-18 LAB — BASIC METABOLIC PANEL WITH GFR
BUN: 19 mg/dL (ref 6–23)
CHLORIDE: 104 meq/L (ref 96–112)
CO2: 23 meq/L (ref 19–32)
CREATININE: 1.29 mg/dL (ref 0.50–1.35)
Calcium: 9.7 mg/dL (ref 8.4–10.5)
GFR, EST NON AFRICAN AMERICAN: 60 mL/min
GFR, Est African American: 69 mL/min
Glucose, Bld: 123 mg/dL — ABNORMAL HIGH (ref 70–99)
Potassium: 4.5 mEq/L (ref 3.5–5.3)
SODIUM: 140 meq/L (ref 135–145)

## 2014-07-18 LAB — HEPATITIS B CORE ANTIBODY, TOTAL: HEP B C TOTAL AB: NONREACTIVE

## 2014-07-18 LAB — RPR

## 2014-07-18 LAB — PSA: PSA: 0.32 ng/mL (ref <=4.00)

## 2014-07-18 LAB — HEPATITIS A ANTIBODY, TOTAL: HEP A TOTAL AB: NONREACTIVE

## 2014-07-18 LAB — INSULIN, FASTING: INSULIN FASTING, SERUM: 8.8 u[IU]/mL (ref 2.0–19.6)

## 2014-07-18 LAB — HIV ANTIBODY (ROUTINE TESTING W REFLEX): HIV: NONREACTIVE

## 2014-07-18 LAB — VITAMIN B12: VITAMIN B 12: 252 pg/mL (ref 211–911)

## 2014-07-18 LAB — URIC ACID: URIC ACID, SERUM: 3.1 mg/dL — AB (ref 4.0–7.8)

## 2014-07-18 LAB — HEPATITIS B SURFACE ANTIBODY,QUALITATIVE: Hep B S Ab: NEGATIVE

## 2014-07-18 LAB — HEPATITIS C ANTIBODY: HCV AB: NEGATIVE

## 2014-07-19 LAB — HEPATITIS B E ANTIBODY: Hepatitis Be Antibody: NONREACTIVE

## 2014-07-20 LAB — TB SKIN TEST
INDURATION: 0 mm
TB SKIN TEST: NEGATIVE

## 2014-08-16 ENCOUNTER — Emergency Department (HOSPITAL_COMMUNITY)
Admission: EM | Admit: 2014-08-16 | Discharge: 2014-08-16 | Disposition: A | Discharge status: Home / Self Care | Payer: BC Managed Care – PPO | Attending: Emergency Medicine | Admitting: Emergency Medicine

## 2014-08-16 ENCOUNTER — Encounter (HOSPITAL_COMMUNITY): Payer: Self-pay | Admitting: Cardiology

## 2014-08-16 DIAGNOSIS — E559 Vitamin D deficiency, unspecified: Secondary | ICD-10-CM | POA: Diagnosis not present

## 2014-08-16 DIAGNOSIS — E119 Type 2 diabetes mellitus without complications: Secondary | ICD-10-CM | POA: Insufficient documentation

## 2014-08-16 DIAGNOSIS — Z7982 Long term (current) use of aspirin: Secondary | ICD-10-CM | POA: Diagnosis not present

## 2014-08-16 DIAGNOSIS — N200 Calculus of kidney: Secondary | ICD-10-CM | POA: Insufficient documentation

## 2014-08-16 DIAGNOSIS — R109 Unspecified abdominal pain: Secondary | ICD-10-CM | POA: Diagnosis present

## 2014-08-16 DIAGNOSIS — Z79899 Other long term (current) drug therapy: Secondary | ICD-10-CM | POA: Diagnosis not present

## 2014-08-16 LAB — CBC WITH DIFFERENTIAL/PLATELET
Basophils Absolute: 0 10*3/uL (ref 0.0–0.1)
Basophils Relative: 0 % (ref 0–1)
EOS PCT: 3 % (ref 0–5)
Eosinophils Absolute: 0.3 10*3/uL (ref 0.0–0.7)
HEMATOCRIT: 42 % (ref 39.0–52.0)
HEMOGLOBIN: 13.7 g/dL (ref 13.0–17.0)
LYMPHS ABS: 2.1 10*3/uL (ref 0.7–4.0)
LYMPHS PCT: 23 % (ref 12–46)
MCH: 28.4 pg (ref 26.0–34.0)
MCHC: 32.6 g/dL (ref 30.0–36.0)
MCV: 87 fL (ref 78.0–100.0)
MONO ABS: 0.7 10*3/uL (ref 0.1–1.0)
MONOS PCT: 7 % (ref 3–12)
NEUTROS ABS: 6.1 10*3/uL (ref 1.7–7.7)
Neutrophils Relative %: 67 % (ref 43–77)
Platelets: 275 10*3/uL (ref 150–400)
RBC: 4.83 MIL/uL (ref 4.22–5.81)
RDW: 13.2 % (ref 11.5–15.5)
WBC: 9.2 10*3/uL (ref 4.0–10.5)

## 2014-08-16 LAB — URINE MICROSCOPIC-ADD ON

## 2014-08-16 LAB — COMPREHENSIVE METABOLIC PANEL
ALT: 31 U/L (ref 0–53)
AST: 25 U/L (ref 0–37)
Albumin: 4.3 g/dL (ref 3.5–5.2)
Alkaline Phosphatase: 73 U/L (ref 39–117)
Anion gap: 13 (ref 5–15)
BUN: 24 mg/dL — ABNORMAL HIGH (ref 6–23)
CALCIUM: 10.1 mg/dL (ref 8.4–10.5)
CO2: 25 meq/L (ref 19–32)
CREATININE: 1.45 mg/dL — AB (ref 0.50–1.35)
Chloride: 102 mEq/L (ref 96–112)
GFR, EST AFRICAN AMERICAN: 59 mL/min — AB (ref >90)
GFR, EST NON AFRICAN AMERICAN: 51 mL/min — AB (ref >90)
GLUCOSE: 167 mg/dL — AB (ref 70–99)
Potassium: 4.6 mEq/L (ref 3.7–5.3)
Sodium: 140 mEq/L (ref 137–147)
Total Protein: 7.7 g/dL (ref 6.0–8.3)

## 2014-08-16 LAB — URINALYSIS, ROUTINE W REFLEX MICROSCOPIC
Bilirubin Urine: NEGATIVE
Glucose, UA: NEGATIVE mg/dL
Ketones, ur: NEGATIVE mg/dL
LEUKOCYTES UA: NEGATIVE
Nitrite: NEGATIVE
PROTEIN: NEGATIVE mg/dL
Specific Gravity, Urine: 1.017 (ref 1.005–1.030)
UROBILINOGEN UA: 0.2 mg/dL (ref 0.0–1.0)
pH: 6 (ref 5.0–8.0)

## 2014-08-16 MED ORDER — TAMSULOSIN HCL 0.4 MG PO CAPS: 0.4000 mg | ORAL_CAPSULE | Freq: Every day | ORAL | Status: DC

## 2014-08-16 MED ORDER — FENTANYL CITRATE 0.05 MG/ML IJ SOLN
50.0000 ug | Freq: Once | INTRAMUSCULAR | Status: AC
  Administered 2014-08-16: 50 ug via INTRAVENOUS
  Filled 2014-08-16: qty 2

## 2014-08-16 MED ORDER — ONDANSETRON 4 MG PO TBDP
8.0000 mg | ORAL_TABLET | Freq: Once | ORAL | Status: AC
  Administered 2014-08-16: 8 mg via ORAL
  Filled 2014-08-16: qty 2

## 2014-08-16 MED ORDER — HYDROMORPHONE HCL 1 MG/ML IJ SOLN
1.0000 mg | Freq: Once | INTRAMUSCULAR | Status: AC
  Administered 2014-08-16: 1 mg via INTRAVENOUS
  Filled 2014-08-16: qty 1

## 2014-08-16 MED ORDER — SODIUM CHLORIDE 0.9 % IV BOLUS (SEPSIS)
1000.0000 mL | Freq: Once | INTRAVENOUS | Status: AC
  Administered 2014-08-16: 1000 mL via INTRAVENOUS

## 2014-08-16 MED ORDER — OXYCODONE-ACETAMINOPHEN 5-325 MG PO TABS: 1.0000 | ORAL_TABLET | Freq: Four times a day (QID) | ORAL | Status: DC | PRN

## 2014-08-16 MED ORDER — OXYCODONE-ACETAMINOPHEN 5-325 MG PO TABS
1.0000 | ORAL_TABLET | Freq: Once | ORAL | Status: AC
  Administered 2014-08-16: 1 via ORAL
  Filled 2014-08-16: qty 1

## 2014-08-16 NOTE — ED Provider Notes (Signed)
CSN: [id number]     Arrival date & time 08/16/14  1806 History   First MD Initiated Contact with Patient 08/16/14 1902     Chief Complaint  Patient presents with  . Abdominal Pain     (Consider location/radiation/quality/duration/timing/severity/associated sxs/prior Treatment) Patient is a 58 y.o. male presenting with flank pain. The history is provided by the patient and a relative (son).  Flank Pain This is a new problem. The current episode started today. The problem occurs constantly. The problem has been gradually worsening. Pertinent negatives include no abdominal pain, chest pain, diaphoresis, fever, headaches, myalgias, nausea, rash, sore throat, vomiting or weakness. Exacerbated by: palpation. He has tried nothing for the symptoms.    Past Medical History  Diagnosis Date  . Vitamin D deficiency   . Diabetes mellitus without complication   . H/O cluster headache    History reviewed. No pertinent past surgical history. Family History  Problem Relation Age of Onset  . Hypertension Father   . Diabetes Father   . Heart attack Father    History  Substance Use Topics  . Smoking status: Never Smoker   . Smokeless tobacco: Not on file  . Alcohol Use: No    Review of Systems  Constitutional: Negative for fever, diaphoresis, activity change and appetite change.  HENT: Negative for facial swelling, sore throat, tinnitus, trouble swallowing and voice change.   Eyes: Negative for pain, redness and visual disturbance.  Respiratory: Negative for chest tightness, shortness of breath and wheezing.   Cardiovascular: Negative for chest pain, palpitations and leg swelling.  Gastrointestinal: Negative for nausea, vomiting, abdominal pain, diarrhea, constipation and abdominal distention.  Endocrine: Negative.   Genitourinary: Positive for flank pain. Negative for dysuria, decreased urine volume, scrotal swelling and testicular pain.  Musculoskeletal: Negative for myalgias, back pain  and gait problem.  Skin: Negative.  Negative for rash.  Neurological: Negative.  Negative for dizziness, tremors, weakness and headaches.  Psychiatric/Behavioral: Negative for suicidal ideas, hallucinations and self-injury. The patient is not nervous/anxious.       Allergies  Valtrex  Home Medications   Prior to Admission medications   Medication Sig Start Date End Date Taking? Authorizing Provider  aspirin EC 81 MG tablet Take 81 mg by mouth at bedtime.   Yes Historical Provider, MD  B Complex-C (SUPER B COMPLEX PO) Take 1 tablet by mouth daily.   Yes Historical Provider, MD  fenofibrate micronized (LOFIBRA) 134 MG capsule TAKE ONE CAPSULE BY MOUTH DAILY 07/11/14  Yes Lucky CowboyWilliam McKeown, MD  metFORMIN (GLUCOPHAGE-XR) 500 MG 24 hr tablet TAKE 2 TABLETS BY MOUTH TWICE A DAY 07/07/14  Yes Lucky CowboyWilliam McKeown, MD  Omega-3 Fatty Acids (FISH OIL) 1000 MG CAPS Take 1,000 mg by mouth 2 (two) times daily.    Yes Historical Provider, MD  ULORIC 80 MG TABS Take 80 mg by mouth at bedtime.  05/17/14  Yes Historical Provider, MD  Vitamin D, Cholecalciferol, 1000 UNITS TABS Take 1,000 mg by mouth 2 (two) times daily.    Yes Historical Provider, MD  oxyCODONE-acetaminophen (PERCOCET/ROXICET) 5-325 MG per tablet Take 1 tablet by mouth every 6 (six) hours as needed for severe pain. 08/16/14   Lula OlszewskiMike Jakeria Caissie, MD  tamsulosin (FLOMAX) 0.4 MG CAPS capsule Take 1 capsule (0.4 mg total) by mouth daily. 08/16/14   Lula OlszewskiMike Traye Bates, MD   BP 123/66 mmHg  Pulse 66  Temp(Src) 98.2 F (36.8 C) (Oral)  Resp 14  Wt 230 lb (104.327 kg)  SpO2 92% Physical Exam  Constitutional: He is oriented to person, place, and time. He appears well-developed and well-nourished. No distress.  HENT:  Head: Normocephalic and atraumatic.  Right Ear: External ear normal.  Left Ear: External ear normal.  Nose: Nose normal.  Mouth/Throat: Oropharynx is clear and moist.  Eyes: Conjunctivae and EOM are normal. Pupils are equal, round, and reactive  to light. No scleral icterus.  Neck: Normal range of motion. Neck supple. No JVD present. No tracheal deviation present. No thyromegaly present.  Cardiovascular: Normal rate and intact distal pulses.  Exam reveals no gallop and no friction rub.   No murmur heard. Pulmonary/Chest: Effort normal and breath sounds normal. No stridor. No respiratory distress. He has no wheezes. He has no rales.  Abdominal: Soft. He exhibits no distension. There is tenderness (left sided CVA tenderness, no abdominal tenderness). There is no rebound and no guarding. Hernia confirmed negative in the right inguinal area and confirmed negative in the left inguinal area.  Genitourinary: Testes normal and penis normal. Cremasteric reflex is present. Right testis shows no swelling and no tenderness. Left testis shows no swelling and no tenderness. Circumcised.  Musculoskeletal: Normal range of motion. He exhibits no edema or tenderness.  Neurological: He is alert and oriented to person, place, and time. No cranial nerve deficit. He exhibits normal muscle tone. Coordination normal.  Skin: Skin is warm and dry. No rash noted. He is not diaphoretic.  Psychiatric: He has a normal mood and affect. His behavior is normal.  Nursing note and vitals reviewed.   ED Course  Procedures (including critical care time) Labs Review Labs Reviewed  COMPREHENSIVE METABOLIC PANEL - Abnormal; Notable for the following:    Glucose, Bld 167 (*)    BUN 24 (*)    Creatinine, Ser 1.45 (*)    Total Bilirubin <0.2 (*)    GFR calc non Af Amer 51 (*)    GFR calc Af Amer 59 (*)    All other components within normal limits  URINALYSIS, ROUTINE W REFLEX MICROSCOPIC - Abnormal; Notable for the following:    APPearance CLOUDY (*)    Hgb urine dipstick MODERATE (*)    All other components within normal limits  CBC WITH DIFFERENTIAL  URINE MICROSCOPIC-ADD ON    Imaging Review No results found.   EKG Interpretation None      MDM   Final  diagnoses:  Left nephrolithiasis    The patient is a 58 y.o. M with hx of uncomplicated nephrolithiasis who presents with 2-3 hours of sharp left flank pain consistent with his previous episodes of nephrolithiasis. Patient AFVSS. Exam as above. UA with blood but no signs of infection consistent with nephrolithiasis and lab work otherwise remarkable. I do not suspect other acute intraabdominal pathology and do not feel further imaging is indicated. Patient discharged with tamulosin, pain control, urology follow up and strict ED return precautions. Patient expresses understanding and agreement with this plan.  Patient seen with attending, Dr. Rosalia Hammersay, who oversaw clinical decision making.     Lula OlszewskiMike Bentleigh Waren, MD 08/16/14 [id number]  Hilario Quarryanielle S Ray, MD 08/18/14 [phone]

## 2014-08-16 NOTE — ED Notes (Signed)
Pt reports that he started about 4pm and radiates around his back. Reports history of a kidney stones, and states that this feels similar. Pt is nauseated and vomited x1 at triage.

## 2014-08-16 NOTE — ED Notes (Signed)
Pt monitored by pulse ox, bp cuff, and 5-lead. 

## 2014-08-25 ENCOUNTER — Encounter: Payer: Self-pay | Admitting: *Deleted

## 2014-11-08 ENCOUNTER — Encounter: Payer: Self-pay | Admitting: Physician Assistant

## 2014-11-08 ENCOUNTER — Ambulatory Visit (INDEPENDENT_AMBULATORY_CARE_PROVIDER_SITE_OTHER): Payer: BLUE CROSS/BLUE SHIELD | Admitting: Physician Assistant

## 2014-11-08 VITALS — BP 120/78 | HR 76 | Temp 98.1°F | Resp 16 | Ht 75.0 in | Wt 233.0 lb

## 2014-11-08 DIAGNOSIS — E559 Vitamin D deficiency, unspecified: Secondary | ICD-10-CM

## 2014-11-08 DIAGNOSIS — Z87442 Personal history of urinary calculi: Secondary | ICD-10-CM | POA: Insufficient documentation

## 2014-11-08 DIAGNOSIS — I10 Essential (primary) hypertension: Secondary | ICD-10-CM

## 2014-11-08 DIAGNOSIS — E1121 Type 2 diabetes mellitus with diabetic nephropathy: Secondary | ICD-10-CM

## 2014-11-08 DIAGNOSIS — Z79899 Other long term (current) drug therapy: Secondary | ICD-10-CM

## 2014-11-08 DIAGNOSIS — E782 Mixed hyperlipidemia: Secondary | ICD-10-CM

## 2014-11-08 DIAGNOSIS — K760 Fatty (change of) liver, not elsewhere classified: Secondary | ICD-10-CM

## 2014-11-08 DIAGNOSIS — K8000 Calculus of gallbladder with acute cholecystitis without obstruction: Secondary | ICD-10-CM

## 2014-11-08 DIAGNOSIS — M1000 Idiopathic gout, unspecified site: Secondary | ICD-10-CM

## 2014-11-08 DIAGNOSIS — K8020 Calculus of gallbladder without cholecystitis without obstruction: Secondary | ICD-10-CM | POA: Insufficient documentation

## 2014-11-08 HISTORY — DX: Personal history of urinary calculi: Z87.442

## 2014-11-08 HISTORY — DX: Fatty (change of) liver, not elsewhere classified: K76.0

## 2014-11-08 HISTORY — DX: Calculus of gallbladder with acute cholecystitis without obstruction: K80.00

## 2014-11-08 LAB — HEPATIC FUNCTION PANEL
ALBUMIN: 4.7 g/dL (ref 3.5–5.2)
ALT: 35 U/L (ref 0–53)
AST: 30 U/L (ref 0–37)
Alkaline Phosphatase: 55 U/L (ref 39–117)
Bilirubin, Direct: 0.1 mg/dL (ref 0.0–0.3)
Indirect Bilirubin: 0.3 mg/dL (ref 0.2–1.2)
Total Bilirubin: 0.4 mg/dL (ref 0.2–1.2)
Total Protein: 7.1 g/dL (ref 6.0–8.3)

## 2014-11-08 LAB — CBC WITH DIFFERENTIAL/PLATELET
BASOS ABS: 0 10*3/uL (ref 0.0–0.1)
BASOS PCT: 0 % (ref 0–1)
Eosinophils Absolute: 0.3 10*3/uL (ref 0.0–0.7)
Eosinophils Relative: 6 % — ABNORMAL HIGH (ref 0–5)
HEMATOCRIT: 43.7 % (ref 39.0–52.0)
Hemoglobin: 14.4 g/dL (ref 13.0–17.0)
Lymphocytes Relative: 32 % (ref 12–46)
Lymphs Abs: 1.7 10*3/uL (ref 0.7–4.0)
MCH: 28.5 pg (ref 26.0–34.0)
MCHC: 33 g/dL (ref 30.0–36.0)
MCV: 86.4 fL (ref 78.0–100.0)
MONO ABS: 0.4 10*3/uL (ref 0.1–1.0)
MONOS PCT: 7 % (ref 3–12)
MPV: 11 fL (ref 8.6–12.4)
Neutro Abs: 3 10*3/uL (ref 1.7–7.7)
Neutrophils Relative %: 55 % (ref 43–77)
Platelets: 269 10*3/uL (ref 150–400)
RBC: 5.06 MIL/uL (ref 4.22–5.81)
RDW: 13.8 % (ref 11.5–15.5)
WBC: 5.4 10*3/uL (ref 4.0–10.5)

## 2014-11-08 LAB — BASIC METABOLIC PANEL WITH GFR
BUN: 15 mg/dL (ref 6–23)
CO2: 24 meq/L (ref 19–32)
Calcium: 9.7 mg/dL (ref 8.4–10.5)
Chloride: 105 mEq/L (ref 96–112)
Creat: 1.27 mg/dL (ref 0.50–1.35)
GFR, EST AFRICAN AMERICAN: 71 mL/min
GFR, EST NON AFRICAN AMERICAN: 61 mL/min
Glucose, Bld: 170 mg/dL — ABNORMAL HIGH (ref 70–99)
Potassium: 4.9 mEq/L (ref 3.5–5.3)
Sodium: 138 mEq/L (ref 135–145)

## 2014-11-08 LAB — LIPID PANEL
CHOLESTEROL: 126 mg/dL (ref 0–200)
HDL: 24 mg/dL — AB (ref >=40)
LDL CALC: 61 mg/dL (ref 0–99)
Total CHOL/HDL Ratio: 5.3 Ratio
Triglycerides: 206 mg/dL — ABNORMAL HIGH (ref <150)
VLDL: 41 mg/dL — ABNORMAL HIGH (ref 0–40)

## 2014-11-08 LAB — MAGNESIUM: Magnesium: 1.7 mg/dL (ref 1.5–2.5)

## 2014-11-08 LAB — TSH: TSH: 2.161 u[IU]/mL (ref 0.350–4.500)

## 2014-11-08 MED ORDER — METFORMIN HCL ER 500 MG PO TB24: 1000.0000 mg | ORAL_TABLET | Freq: Two times a day (BID) | ORAL | Status: DC

## 2014-11-08 NOTE — Patient Instructions (Signed)
Diabetes is a very complicated disease...lets simplify it.  An easy way to look at it to understand the complications is if you think of the extra sugar floating in your blood stream as glass shards floating through your blood stream.    Diabetes affects your small vessels first: 1) The glass shards (sugar) scraps down the tiny blood vessels in your eyes and lead to diabetic retinopathy, the leading cause of blindness in the [place]. Diabetes is the leading cause of newly diagnosed adult (20 to 61 years of age) blindness in the [place].  2) The glass shards scratches down the tiny vessels of your legs leading to nerve damage called neuropathy and can lead to amputations of your feet. More than 60% of all non-traumatic amputations of lower limbs occur in people with diabetes.  3) Over time the small vessels in your brain are shredded and closed off, individually this does not cause any problems but over a long period of time many of the small vessels being blocked can lead to Vascular Dementia.   4) Your kidney's are a filter system and have a "net" that keeps certain things in the body and lets bad things out. Sugar shreds this net and leads to kidney damage and eventually failure. Decreasing the sugar that is destroying the net and certain blood pressure medications can help stop or decrease progression of kidney disease. Diabetes was the primary cause of kidney failure in 44 percent of all new cases in 2011.  5) Diabetes also destroys the small vessels in your penis that lead to erectile dysfunction. Eventually the vessels are so damaged that you may not be responsive to cialis or viagra.   Diabetes and your large vessels: Your larger vessels consist of your coronary arteries in your heart and the carotid vessels to your brain. Diabetes or even increased sugars put you at 300% increased risk of heart attack and stroke and this is why.. The sugar scrapes down your large blood vessels and your body  sees this as an internal injury and tries to repair itself. Just like you get a scab on your skin, your platelets will stick to the blood vessel wall trying to heal it. This is why we have diabetics on low dose aspirin daily, this prevents the platelets from sticking and can prevent plaque formation. In addition, your body takes cholesterol and tries to shove it into the open wound. This is why we want your LDL, or bad cholesterol, below 70.   The combination of platelets and cholesterol over 5-10 years forms plaque that can break off and cause a heart attack or stroke.   PLEASE REMEMBER:  Diabetes is preventable! Up to 85 percent of complications and morbidities among individuals with type 2 diabetes can be prevented, delayed, or effectively treated and minimized with regular visits to a health professional, appropriate monitoring and medication, and a healthy diet and lifestyle.     Bad carbs also include fruit juice, alcohol, and sweet tea. These are empty calories that do not signal to your brain that you are full.   Please remember the good carbs are still carbs which convert into sugar. So please measure them out no more than 1/2-1 cup of rice, oatmeal, pasta, and beans  Veggies are however free foods! Pile them on.   Not all fruit is created equal. Please see the list below, the fruit at the bottom is higher in sugars than the fruit at the top. Please avoid all dried fruits.     

## 2014-11-08 NOTE — Progress Notes (Signed)
Assessment and Plan:  Hypertension: Continue medication, monitor blood pressure at home. Continue DASH diet.  Reminder to go to the ER if any CP, SOB, nausea, dizziness, severe HA, changes vision/speech, left arm numbness and tingling and jaw pain. Cholesterol: Continue diet and exercise. Check cholesterol.  Diabetes with diabetic chronic kidney disease-Continue diet and exercise. Check A1C Vitamin D Def- check level and continue medications.  Gout- recheck Uric acid as needed, Diet discussed, continue medications.  Continue diet and meds as discussed. Further disposition pending results of labs. Discussed med's effects and SE's.    HPI 59 y.o. male  presents for 3 month follow up HTN, Chol, DM with CKD, and vitamin D def.   His blood pressure has been controlled at home, today his BP is BP: 120/78 mmHg.  He does not workout during cold weather but will start more with the nicer temps. He denies chest pain, shortness of breath, dizziness.  He is on cholesterol medication, fenofibrate and denies myalgias. His cholesterol is at goal. The cholesterol was:  07/17/2014: Cholesterol, Total 116; HDL-C 31*; LDL (calc) 54; Triglycerides 157*  He has been working on diet and exercise for diabetes with diabetic chronic kidney disease, he is on bASA, he is not on ACE/ARB, MF  daily and denies  paresthesia of the feet, polydipsia, polyuria and visual disturbances. Last A1C was: 07/17/2014: Hemoglobin-A1c 7.4*  Patient is on Vitamin D supplement. 07/17/2014: VITD 36 He sees Dr. Nickola Major for gout and is on uloric, no flares.  Went to the ER for kidney stone, states he did pass it, no more pain.   Current Medications:  Current Outpatient Prescriptions on File Prior to Visit  Medication Sig Dispense Refill  . aspirin EC 81 MG tablet Take 81 mg by mouth at bedtime.    . B Complex-C (SUPER B COMPLEX PO) Take 1 tablet by mouth daily.    . fenofibrate micronized (LOFIBRA) 134 MG capsule TAKE ONE CAPSULE BY  MOUTH DAILY 30 capsule 99  . metFORMIN (GLUCOPHAGE-XR) 500 MG 24 hr tablet TAKE 2 TABLETS BY MOUTH TWICE A DAY 360 tablet 1  . Omega-3 Fatty Acids (FISH OIL) 1000 MG CAPS Take 1,000 mg by mouth 2 (two) times daily.     Marland Kitchen ULORIC 80 MG TABS Take 80 mg by mouth at bedtime.   0  . Vitamin D, Cholecalciferol, 1000 UNITS TABS Take 1,000 mg by mouth 2 (two) times daily.      No current facility-administered medications on file prior to visit.   Medical History:  Past Medical History  Diagnosis Date  . Vitamin D deficiency   . Diabetes mellitus without complication   . H/O cluster headache    Allergies:  Allergies  Allergen Reactions  . Valtrex [Valacyclovir Hcl] Hives     Review of Systems:  Review of Systems  Constitutional: Negative.   HENT: Negative.   Eyes: Negative.   Respiratory: Negative.   Cardiovascular: Negative.   Gastrointestinal: Negative.   Genitourinary: Negative.   Musculoskeletal: Negative.   Skin: Negative.   Neurological: Negative.   Endo/Heme/Allergies: Negative.   Psychiatric/Behavioral: Negative.     Family history- Review and unchanged Social history- Review and unchanged Physical Exam: BP 120/78 mmHg  Pulse 76  Temp(Src) 98.1 F (36.7 C)  Resp 16  Wt 233 lb (105.688 kg) Wt Readings from Last 3 Encounters:  11/08/14 233 lb (105.688 kg)  08/16/14 230 lb (104.327 kg)  07/17/14 230 lb (104.327 kg)   General Appearance: Well nourished,  in no apparent distress. Eyes: PERRLA, EOMs, conjunctiva no swelling or erythema Sinuses: No Frontal/maxillary tenderness ENT/Mouth: Ext aud canals clear, TMs without erythema, bulging. No erythema, swelling, or exudate on post pharynx.  Tonsils not swollen or erythematous. Hearing normal.  Neck: Supple, thyroid normal.  Respiratory: Respiratory effort normal, BS equal bilaterally without rales, rhonchi, wheezing or stridor.  Cardio: RRR with no MRGs. Brisk peripheral pulses without edema.  Abdomen: Soft, + BS.  Non  tender, no guarding, rebound, hernias, masses. Lymphatics: Non tender without lymphadenopathy.  Musculoskeletal: Full ROM, 5/5 strength, Normal gait Skin: Warm, dry without rashes, lesions, ecchymosis.  Neuro: Cranial nerves intact. No cerebellar symptoms.  Psych: Awake and oriented X 3, normal affect, Insight and Judgment appropriate.    Quentin Mullingollier, Amanda, PA-C 9:02 AM [facility]

## 2014-11-09 LAB — HEMOGLOBIN A1C
Hgb A1c MFr Bld: 8.2 % — ABNORMAL HIGH (ref <5.7)
MEAN PLASMA GLUCOSE: 189 mg/dL — AB (ref <117)

## 2014-11-09 LAB — VITAMIN D 25 HYDROXY (VIT D DEFICIENCY, FRACTURES): VIT D 25 HYDROXY: 36 ng/mL (ref 30–100)

## 2014-12-17 ENCOUNTER — Telehealth: Payer: Self-pay | Admitting: *Deleted

## 2014-12-17 MED ORDER — AZITHROMYCIN 250 MG PO TABS
ORAL_TABLET | ORAL | Status: DC
Start: 2014-12-17 — End: 2015-01-01

## 2014-12-17 NOTE — Telephone Encounter (Signed)
Patient called and states he has sinus drainage with a fever, sore throat, chcongestion and green mucus.  OK to send in a Z-pak per Dr Oneta RackMcKeown.

## 2014-12-20 ENCOUNTER — Encounter: Payer: Self-pay | Admitting: Physician Assistant

## 2014-12-20 ENCOUNTER — Ambulatory Visit (INDEPENDENT_AMBULATORY_CARE_PROVIDER_SITE_OTHER): Payer: BLUE CROSS/BLUE SHIELD | Admitting: Physician Assistant

## 2014-12-20 VITALS — BP 128/82 | HR 84 | Temp 97.9°F | Resp 16 | Ht 75.0 in | Wt 225.0 lb

## 2014-12-20 DIAGNOSIS — M25511 Pain in right shoulder: Secondary | ICD-10-CM

## 2014-12-20 DIAGNOSIS — E1121 Type 2 diabetes mellitus with diabetic nephropathy: Secondary | ICD-10-CM

## 2014-12-20 DIAGNOSIS — R0789 Other chest pain: Secondary | ICD-10-CM

## 2014-12-20 DIAGNOSIS — R0602 Shortness of breath: Secondary | ICD-10-CM

## 2014-12-20 MED ORDER — IPRATROPIUM-ALBUTEROL 0.5-2.5 (3) MG/3ML IN SOLN
3.0000 mL | Freq: Once | RESPIRATORY_TRACT | Status: AC
  Administered 2014-12-20: 3 mL via RESPIRATORY_TRACT

## 2014-12-20 MED ORDER — ALBUTEROL SULFATE 108 (90 BASE) MCG/ACT IN AEPB: 2.0000 | INHALATION_SPRAY | Freq: Four times a day (QID) | RESPIRATORY_TRACT | Status: DC | PRN

## 2014-12-20 MED ORDER — PROMETHAZINE-CODEINE 6.25-10 MG/5ML PO SYRP: 5.0000 mL | ORAL_SOLUTION | Freq: Four times a day (QID) | ORAL | Status: DC | PRN

## 2014-12-20 MED ORDER — DEXAMETHASONE SODIUM PHOSPHATE 100 MG/10ML IJ SOLN
10.0000 mg | Freq: Once | INTRAMUSCULAR | Status: AC
  Administered 2014-12-20: 10 mg via INTRAMUSCULAR

## 2014-12-20 MED ORDER — LEVOFLOXACIN 500 MG PO TABS: 500.0000 mg | ORAL_TABLET | Freq: Every day | ORAL | Status: DC

## 2014-12-20 NOTE — Progress Notes (Signed)
Subjective:    Patient ID: Sean [name], male    DOB: [birth year], 59 y.o.   MRN: [id number]  Cough Associated symptoms include chest pain, a fever, postnasal drip, rhinorrhea, a sore throat, shortness of breath and wheezing. Pertinent negatives include no chills, ear pain or headaches.   59 y.o. nonsmoking male with history of HTN, DM II with CKD, cluster HA, presents with 1 month of sinus symptoms. He had been taking mucinex DM started to feel better but then it was worse Saturday the 16th. He called 12/17/2014 and started on a zpak that day, has one more tomorrow. He is feeling a little better but has no energy. feels like a lump in his throat, Chest pain with a cough only, difficult to get a full breath, very tight chest, nonproductive cough, post nasal drip. Had fever Sunday and Monday but better now. No leg swelling, no hx DVT, no SX, no injury, no recent travel.   Also complains of neck pain with numbness down his right arm, worse when he wakes in the morning.   Blood pressure 128/82, pulse 84, temperature 97.9 F (36.6 C), resp. rate 16, weight 225 lb (102.059 kg).   Current Outpatient Prescriptions on File Prior to Visit  Medication Sig Dispense Refill  . aspirin EC 81 MG tablet Take 81 mg by mouth at bedtime.    Marland Kitchen. azithromycin (ZITHROMAX) 250 MG tablet Take 2 tabs on day 1 and then 1 tab daily x 4 days. 6 each 0  . B Complex-C (SUPER B COMPLEX PO) Take 1 tablet by mouth daily.    . fenofibrate micronized (LOFIBRA) 134 MG capsule TAKE ONE CAPSULE BY MOUTH DAILY 30 capsule 99  . metFORMIN (GLUCOPHAGE-XR) 500 MG 24 hr tablet Take 2 tablets (1,000 mg total) by mouth 2 (two) times daily. 360 tablet 1  . Omega-3 Fatty Acids (FISH OIL) 1000 MG CAPS Take 1,000 mg by mouth 2 (two) times daily.     Marland Kitchen. ULORIC 80 MG TABS Take 80 mg by mouth at bedtime.   0  . Vitamin D, Cholecalciferol, 1000 UNITS TABS Take 1,000 mg by mouth 2 (two) times daily.      No current facility-administered  medications on file prior to visit.    Past Medical History  Diagnosis Date  . Vitamin D deficiency   . Diabetes mellitus without complication   . H/O cluster headache      Review of Systems  Constitutional: Positive for fever and fatigue. Negative for chills and diaphoresis.  HENT: Positive for congestion, postnasal drip, rhinorrhea, sinus pressure and sore throat. Negative for ear pain, sneezing, trouble swallowing and voice change.   Eyes: Negative.   Respiratory: Positive for cough, shortness of breath and wheezing. Negative for chest tightness.   Cardiovascular: Positive for chest pain. Negative for palpitations and leg swelling.  Gastrointestinal: Negative.   Genitourinary: Negative.   Musculoskeletal: Negative.  Negative for neck pain.  Neurological: Negative.  Negative for headaches.       Objective:   Physical Exam  Constitutional: He is oriented to person, place, and time. He appears well-developed and well-nourished.  HENT:  Head: Normocephalic and atraumatic.  Eyes: Pupils are equal, round, and reactive to light.  Neck: Normal range of motion. Neck supple.  Cardiovascular: Normal rate and regular rhythm.   No murmur heard. Pulmonary/Chest: No accessory muscle usage. No respiratory distress. He has decreased breath sounds (coarse, decreased diffuse breath sounds. 97% RA). He has no wheezes. He has  no rhonchi. He has no rales.  Abdominal: Bowel sounds are normal.  Musculoskeletal: Normal range of motion.  Neurological: He is alert and oriented to person, place, and time.  Skin: Skin is warm and dry. He is not diaphoretic.       Assessment & Plan:  SOB/CP-  likely bronchitis high risk CAD will get EKG- EKG normal, no ST changes.  Questionable if needs another ABX since fever is resolved and no alerting vitals, will treat symptoms first, finish zpak, breathing treatment in office, respiclick inhaler, dexamethasone shot, allergy pill, if not better will give  levaquin since going into the weekend, hold the ABX.  Lungs with more air movement after TX in the office.  Worsening SOB, CP go to ER.

## 2014-12-20 NOTE — Patient Instructions (Signed)
Get cervical neck pillow  I will give you a prescription for an antibiotic, but please only take it if you are not feeling better in 7-10 days.  Bronchitis is mostly caused by viruses and the antibiotic will do nothing.  PLEASE TRY TO DO OVER THE COUNTER TREATMENT AND/OR PREDNISONE FOR 5-7 DAYS AND IF YOU ARE NOT GETTING BETTER OR GETTING WORSE THEN YOU CAN START ON AN ANTIBIOTIC GIVEN.  Can take the prednisone AT NIGHT WITH DINNER, it take 8-12 hours to start working so it will NOT affect your sleeping if you take it at night with your food!! Take two pills the first night and 1 or two pill the second night and then 1 pill the other nights.    Rest and stay hydrated.  Make sure you drink plenty of fluids to make sure urine is clear when you urinate.  Water will help thin out mucous. - Take Mucinex DM- Maximum Strength over the counter to thin out and cough up the thick mucous.  Please follow directions on box. -Take Albuterol if prescribed.  Risk of antibiotic use: About 1 in 4 people who take antibiotics have side effects including stomach problems, dizziness, or rashes. Those problems clear up soon after stopping the drugs, but in rare cases antibiotics can cause severe allergic reaction. Over use of antibiotics also encourages the growth of bacteria that can't be controlled easily with drugs. That makes you more vunerable to antibiotic-resistant infections and undermines the benefits of antibiotics for others.   Waste of Money: Antibiotics often aren't very expensive, but any money spent on unnecessary drugs is money down the drain.   When are antibiotics needed? Only when symptoms last longer than a week.  Start to improve but then worsen again  Please call the office or message through My Chart if you have any questions.   Acute Bronchitis Bronchitis is when the airways that extend from the windpipe into the lungs get red, puffy, and painful (inflamed). Bronchitis often causes thick  spit (mucus) to develop. This leads to a cough. A cough is the most common symptom of bronchitis. In acute bronchitis, the condition usually begins suddenly and goes away over time (usually in 2 weeks). Smoking, allergies, and asthma can make bronchitis worse. Repeated episodes of bronchitis may cause more lung problems.  Most common cause of Bronchitis is viruses (rhinovirus, coronavirus, RSV).  Therefore, not requiring an antibiotic; as antibiotics only treat bacterial infections.  HOME CARE  Rest.  Drink enough fluids to keep your pee (urine) clear or pale yellow (unless you need to limit fluids as told by your doctor).  Only take over-the-counter or prescription medicines as told by your doctor.  Avoid smoking and secondhand smoke. These can make bronchitis worse. If you are a smoker, think about using nicotine gum or skin patches. Quitting smoking will help your lungs heal faster.  Reduce the chance of getting bronchitis again by:  Washing your hands often.  Avoiding people with cold symptoms.  Trying not to touch your hands to your mouth, nose, or eyes.  Follow up with your doctor as told. GET HELP IF: Your symptoms do not improve after 1 week of treatment. Symptoms include:  Cough.  Fever.  Coughing up thick spit.  Body aches.  Chest congestion.  Chills.  Shortness of breath.  Sore throat. GET HELP RIGHT AWAY IF:   You have an increased fever.  You have chills.  You have severe shortness of breath.  You have  bloody thick spit (sputum).  You throw up (vomit) often.  You lose too much body fluid (dehydration).  You have a severe headache.  You faint. MAKE SURE YOU:   Understand these instructions.  Will watch your condition.  Will get help right away if you are not doing well or get worse. Document Released: 02/03/2008 Document Revised: 04/19/2013 Document Reviewed: 02/07/2013 [facility] Patient Information 2015 [place], [place]. This information  is not intended to replace advice given to you by your health care provider. Make sure you discuss any questions you have with your health care provider.

## 2015-01-01 ENCOUNTER — Encounter: Payer: Self-pay | Admitting: Internal Medicine

## 2015-01-01 ENCOUNTER — Ambulatory Visit (INDEPENDENT_AMBULATORY_CARE_PROVIDER_SITE_OTHER): Payer: BLUE CROSS/BLUE SHIELD | Admitting: Internal Medicine

## 2015-01-01 VITALS — BP 142/86 | HR 90 | Temp 98.2°F | Resp 16 | Ht 74.0 in | Wt 215.0 lb

## 2015-01-01 DIAGNOSIS — F329 Major depressive disorder, single episode, unspecified: Secondary | ICD-10-CM

## 2015-01-01 DIAGNOSIS — Z79899 Other long term (current) drug therapy: Secondary | ICD-10-CM

## 2015-01-01 DIAGNOSIS — F32A Depression, unspecified: Secondary | ICD-10-CM

## 2015-01-01 DIAGNOSIS — R0789 Other chest pain: Secondary | ICD-10-CM

## 2015-01-01 DIAGNOSIS — R5383 Other fatigue: Secondary | ICD-10-CM

## 2015-01-01 MED ORDER — SERTRALINE HCL 50 MG PO TABS: 50.0000 mg | ORAL_TABLET | Freq: Every day | ORAL | Status: DC

## 2015-01-01 MED ORDER — ALPRAZOLAM 0.5 MG PO TABS: 0.5000 mg | ORAL_TABLET | Freq: Three times a day (TID) | ORAL | Status: DC | PRN

## 2015-01-01 NOTE — Progress Notes (Signed)
Subjective:    Patient ID: Sean [name], male    DOB: April 05, 1956, 59 y.o.   MRN: [id number]  Chest Pain  Associated symptoms include a cough, dizziness, nausea, shortness of breath (with exertion) and vomiting (x2). Pertinent negatives include no abdominal pain, fever, numbness (chronic neck crick) or palpitations.  Patient is a 59 y.o. Male who presents to the office for evaluation of chest tightness and fatigue.  Over the past two months he has had a very hard time taking a deep breath in.  He reports that he had some nasal congestion and mild coughing and completed several different antibiotics including 2 zpaks and levaquin, oral prednisone, and decadron shot.  He reports nothing has helped.  He has lost a significant amount of weight and has some difficulty with his appetite.  He feels like this may be improving.  He is a never smoker.  He has no personal history of blood clots.  His mother had a blood clot in her legs.  He has been able to get to work but feels that he just can't do a whole lot.    He reports that he is not sleeping well since his wife passed away.  He reports that his sickness is contributing to this but he thinks that the sleeping issues started before that.  He is more having difficulty with staying asleep.     Review of Systems  Constitutional: Positive for activity change, appetite change, fatigue and unexpected weight change. Negative for fever and chills.  HENT: Positive for postnasal drip and rhinorrhea. Negative for congestion, sinus pressure, sore throat and trouble swallowing.   Respiratory: Positive for cough, chest tightness and shortness of breath (with exertion).   Cardiovascular: Positive for chest pain. Negative for palpitations and leg swelling.  Gastrointestinal: Positive for nausea and vomiting (x2). Negative for abdominal pain, diarrhea, constipation, blood in stool and anal bleeding.  Genitourinary: Negative for dysuria, frequency, hematuria and  difficulty urinating.  Musculoskeletal: Negative.   Neurological: Positive for dizziness and light-headedness. Negative for numbness (chronic neck crick).  Psychiatric/Behavioral: Positive for dysphoric mood and decreased concentration. Negative for sleep disturbance. The patient is nervous/anxious.        Objective:   Physical Exam  Constitutional: He is oriented to person, place, and time. He appears well-developed and well-nourished. No distress.  HENT:  Head: Normocephalic and atraumatic.  Mouth/Throat: Oropharynx is clear and moist. No oropharyngeal exudate.  Eyes: Conjunctivae and EOM are normal. Pupils are equal, round, and reactive to light. No scleral icterus.  Neck: Normal range of motion. Neck supple. No JVD present. No thyromegaly present.  Cardiovascular: Normal rate, regular rhythm, normal heart sounds and intact distal pulses.  Exam reveals no gallop and no friction rub.   No murmur heard. Pulmonary/Chest: Effort normal and breath sounds normal. No respiratory distress. He has no wheezes. He has no rales. He exhibits no tenderness.  Musculoskeletal: Normal range of motion.  Lymphadenopathy:    He has no cervical adenopathy.  Neurological: He is alert and oriented to person, place, and time.  Skin: Skin is warm and dry. He is not diaphoretic.  Psychiatric: His speech is normal. Judgment normal. He is slowed. Cognition and memory are normal. He exhibits a depressed mood. He expresses no homicidal and no suicidal ideation. He expresses no suicidal plans and no homicidal plans.  Nursing note and vitals reviewed.   Filed Vitals:   01/01/15 1454  BP: 142/86  Pulse: 90  Temp: 98.2 F (  36.8 C)  Resp: 16          Assessment & Plan:   Concerns for significant weight loss in the past few weeks.  Likely due to recent abx use and also depression.  Concerns for possible lung dysfunction vs. Infection.  Will obtain CXR.  If no improvement with anoro will send for PFTs.     1. Medication management  - CBC with Differential/Platelet - BASIC METABOLIC PANEL WITH GFR - Hepatic function panel - Magnesium  2. Other fatigue  - Iron and TIBC - Vitamin B12 - RBC Folate - Ferritin - TSH  3. Chest tightness -anoro samples given -cont albuterol -no improvement likely needs to see pulmonology - DG Chest 2 View; Future  4. Depression -may be likely source of weight loss and part of fatigue.  Patient also not sleeping well.  Use xanax to help with sleeping Suggested therapy. - sertraline (ZOLOFT) 50 MG tablet; Take 1 tablet (50 mg total) by mouth daily.  Dispense: 30 tablet; Refill: 2 - ALPRAZolam (XANAX) 0.5 MG tablet; Take 1 tablet (0.5 mg total) by mouth 3 (three) times daily as needed for sleep or anxiety.  Dispense: 30 tablet; Refill: 0

## 2015-01-01 NOTE — Patient Instructions (Signed)
Sertraline tablets What is this medicine? SERTRALINE (SER tra leen) is used to treat depression. It may also be used to treat obsessive compulsive disorder, panic disorder, post-trauma stress, premenstrual dysphoric disorder (PMDD) or social anxiety. This medicine may be used for other purposes; ask your health care provider or pharmacist if you have questions. COMMON BRAND NAME(S): Zoloft What should I tell my health care provider before I take this medicine? They need to know if you have any of these conditions: -bipolar disorder or a family history of bipolar disorder -diabetes -glaucoma -heart disease -high blood pressure -history of irregular heartbeat -history of low levels of calcium, magnesium, or potassium in the blood -if you often drink alcohol -liver disease -receiving electroconvulsive therapy -seizures -suicidal thoughts, plans, or attempt; a previous suicide attempt by you or a family member -thyroid disease -an unusual or allergic reaction to sertraline, other medicines, foods, dyes, or preservatives -pregnant or trying to get pregnant -breast-feeding How should I use this medicine? Take this medicine by mouth with a glass of water. Follow the directions on the prescription label. You can take it with or without food. Take your medicine at regular intervals. Do not take your medicine more often than directed. Do not stop taking this medicine suddenly except upon the advice of your doctor. Stopping this medicine too quickly may cause serious side effects or your condition may worsen. A special MedGuide will be given to you by the pharmacist with each prescription and refill. Be sure to read this information carefully each time. Talk to your pediatrician regarding the use of this medicine in children. While this drug may be prescribed for children as young as 7 years for selected conditions, precautions do apply. Overdosage: If you think you have taken too much of this  medicine contact a poison control center or emergency room at once. NOTE: This medicine is only for you. Do not share this medicine with others. What if I miss a dose? If you miss a dose, take it as soon as you can. If it is almost time for your next dose, take only that dose. Do not take double or extra doses. What may interact with this medicine? Do not take this medicine with any of the following medications: -certain medicines for fungal infections like fluconazole, itraconazole, ketoconazole, posaconazole, voriconazole -cisapride -disulfiram -dofetilide -linezolid -MAOIs like Carbex, Eldepryl, Marplan, Nardil, and Parnate -metronidazole -methylene blue (injected into a vein) -pimozide -thioridazine -ziprasidone This medicine may also interact with the following medications: -alcohol -aspirin and aspirin-like medicines -certain medicines for depression, anxiety, or psychotic disturbances -certain medicines for irregular heart beat like flecainide, propafenone -certain medicines for migraine headaches like almotriptan, eletriptan, frovatriptan, naratriptan, rizatriptan, sumatriptan, zolmitriptan -certain medicines for sleep -certain medicines for seizures like carbamazepine, valproic acid, phenytoin -certain medicines that treat or prevent blood clots like warfarin, enoxaparin, dalteparin -cimetidine -digoxin -diuretics -fentanyl -furazolidone -isoniazid -lithium -NSAIDs, medicines for pain and inflammation, like ibuprofen or naproxen -other medicines that prolong the QT interval (cause an abnormal heart rhythm) -procarbazine -rasagiline -supplements like St. John's wort, kava kava, valerian -tolbutamide -tramadol -tryptophan This list may not describe all possible interactions. Give your health care provider a list of all the medicines, herbs, non-prescription drugs, or dietary supplements you use. Also tell them if you smoke, drink alcohol, or use illegal drugs. Some  items may interact with your medicine. What should I watch for while using this medicine? Tell your doctor if your symptoms do not get better or if they get   worse. Visit your doctor or health care professional for regular checks on your progress. Because it may take several weeks to see the full effects of this medicine, it is important to continue your treatment as prescribed by your doctor. Patients and their families should watch out for new or worsening thoughts of suicide or depression. Also watch out for sudden changes in feelings such as feeling anxious, agitated, panicky, irritable, hostile, aggressive, impulsive, severely restless, overly excited and hyperactive, or not being able to sleep. If this happens, especially at the beginning of treatment or after a change in dose, call your health care professional. You may get drowsy or dizzy. Do not drive, use machinery, or do anything that needs mental alertness until you know how this medicine affects you. Do not stand or sit up quickly, especially if you are an older patient. This reduces the risk of dizzy or fainting spells. Alcohol may interfere with the effect of this medicine. Avoid alcoholic drinks. Your mouth may get dry. Chewing sugarless gum or sucking hard candy, and drinking plenty of water may help. Contact your doctor if the problem does not go away or is severe. What side effects may I notice from receiving this medicine? Side effects that you should report to your doctor or health care professional as soon as possible: -allergic reactions like skin rash, itching or hives, swelling of the face, lips, or tongue -black or bloody stools, blood in the urine or vomit -fast, irregular heartbeat -feeling faint or lightheaded, falls -hallucination, loss of contact with reality -seizures -suicidal thoughts or other mood changes -unusual bleeding or bruising -unusually weak or tired -vomiting Side effects that usually do not require  medical attention (report to your doctor or health care professional if they continue or are bothersome): -change in appetite -change in sex drive or performance -diarrhea -increased sweating -indigestion, nausea -tremors This list may not describe all possible side effects. Call your doctor for medical advice about side effects. You may report side effects to FDA at 1-800-FDA-1088. Where should I keep my medicine? Keep out of the reach of children. Store at room temperature between 15 and 30 degrees C (59 and 86 degrees F). Throw away any unused medicine after the expiration date. NOTE: This sheet is a summary. It may not cover all possible information. If you have questions about this medicine, talk to your doctor, pharmacist, or health care provider.  2015, [employer]. (2013-03-14 12:57:35)  

## 2015-01-02 ENCOUNTER — Other Ambulatory Visit: Payer: Self-pay | Admitting: Internal Medicine

## 2015-01-02 ENCOUNTER — Ambulatory Visit (HOSPITAL_COMMUNITY)
Admission: RE | Admit: 2015-01-02 | Discharge: 2015-01-02 | Disposition: A | Discharge status: Home / Self Care | Payer: BLUE CROSS/BLUE SHIELD | Source: Ambulatory Visit | Attending: Internal Medicine | Admitting: Internal Medicine

## 2015-01-02 DIAGNOSIS — R05 Cough: Secondary | ICD-10-CM | POA: Insufficient documentation

## 2015-01-02 DIAGNOSIS — R5383 Other fatigue: Secondary | ICD-10-CM | POA: Diagnosis not present

## 2015-01-02 DIAGNOSIS — R0989 Other specified symptoms and signs involving the circulatory and respiratory systems: Secondary | ICD-10-CM | POA: Insufficient documentation

## 2015-01-02 DIAGNOSIS — R0789 Other chest pain: Secondary | ICD-10-CM

## 2015-01-02 LAB — CBC WITH DIFFERENTIAL/PLATELET
BASOS ABS: 0.1 10*3/uL (ref 0.0–0.1)
Basophils Relative: 1 % (ref 0–1)
EOS ABS: 0.2 10*3/uL (ref 0.0–0.7)
EOS PCT: 2 % (ref 0–5)
HCT: 42.5 % (ref 39.0–52.0)
Hemoglobin: 14.6 g/dL (ref 13.0–17.0)
LYMPHS ABS: 2 10*3/uL (ref 0.7–4.0)
LYMPHS PCT: 22 % (ref 12–46)
MCH: 29.6 pg (ref 26.0–34.0)
MCHC: 34.4 g/dL (ref 30.0–36.0)
MCV: 86 fL (ref 78.0–100.0)
MPV: 13.3 fL — ABNORMAL HIGH (ref 8.6–12.4)
Monocytes Absolute: 0.6 10*3/uL (ref 0.1–1.0)
Monocytes Relative: 7 % (ref 3–12)
NEUTROS PCT: 68 % (ref 43–77)
Neutro Abs: 6.1 10*3/uL (ref 1.7–7.7)
PLATELETS: 248 10*3/uL (ref 150–400)
RBC: 4.94 MIL/uL (ref 4.22–5.81)
RDW: 13.1 % (ref 11.5–15.5)
WBC: 9 10*3/uL (ref 4.0–10.5)

## 2015-01-02 LAB — BASIC METABOLIC PANEL WITH GFR
BUN: 20 mg/dL (ref 6–23)
CO2: 22 meq/L (ref 19–32)
CREATININE: 1.08 mg/dL (ref 0.50–1.35)
Calcium: 9.5 mg/dL (ref 8.4–10.5)
Chloride: 96 mEq/L (ref 96–112)
GFR, EST AFRICAN AMERICAN: 86 mL/min
GFR, Est Non African American: 74 mL/min
Glucose, Bld: 562 mg/dL (ref 70–99)
Potassium: 4.6 mEq/L (ref 3.5–5.3)
SODIUM: 129 meq/L — AB (ref 135–145)

## 2015-01-02 LAB — HEPATIC FUNCTION PANEL
ALK PHOS: 106 U/L (ref 39–117)
ALT: 25 U/L (ref 0–53)
AST: 18 U/L (ref 0–37)
Albumin: 4.2 g/dL (ref 3.5–5.2)
BILIRUBIN DIRECT: 0.1 mg/dL (ref 0.0–0.3)
BILIRUBIN INDIRECT: 0.4 mg/dL (ref 0.2–1.2)
BILIRUBIN TOTAL: 0.5 mg/dL (ref 0.2–1.2)
TOTAL PROTEIN: 7.1 g/dL (ref 6.0–8.3)

## 2015-01-02 LAB — MAGNESIUM: Magnesium: 2 mg/dL (ref 1.5–2.5)

## 2015-01-02 LAB — FERRITIN: FERRITIN: 381 ng/mL — AB (ref 22–322)

## 2015-01-02 LAB — VITAMIN B12: Vitamin B-12: 346 pg/mL (ref 211–911)

## 2015-01-02 LAB — IRON AND TIBC
%SAT: 18 % — ABNORMAL LOW (ref 20–55)
Iron: 48 ug/dL (ref 42–165)
TIBC: 270 ug/dL (ref 215–435)
UIBC: 222 ug/dL (ref 125–400)

## 2015-01-02 LAB — TSH: TSH: 0.884 u[IU]/mL (ref 0.350–4.500)

## 2015-01-02 LAB — FOLATE RBC: RBC Folate: 988 ng/mL (ref >280)

## 2015-01-02 MED ORDER — SITAGLIPTIN PHOSPHATE 50 MG PO TABS: 50.0000 mg | ORAL_TABLET | Freq: Every day | ORAL | Status: DC

## 2015-01-14 ENCOUNTER — Other Ambulatory Visit: Payer: Self-pay

## 2015-01-14 DIAGNOSIS — E1121 Type 2 diabetes mellitus with diabetic nephropathy: Secondary | ICD-10-CM

## 2015-01-14 MED ORDER — FENOFIBRATE MICRONIZED 134 MG PO CAPS: 134.0000 mg | ORAL_CAPSULE | Freq: Every day | ORAL | Status: DC

## 2015-01-14 MED ORDER — METFORMIN HCL ER 500 MG PO TB24: 1000.0000 mg | ORAL_TABLET | Freq: Two times a day (BID) | ORAL | Status: DC

## 2015-01-14 MED ORDER — SITAGLIPTIN PHOSPHATE 50 MG PO TABS: 50.0000 mg | ORAL_TABLET | Freq: Every day | ORAL | Status: DC

## 2015-01-17 ENCOUNTER — Other Ambulatory Visit: Payer: Self-pay | Admitting: *Deleted

## 2015-01-17 MED ORDER — GLUCOSE BLOOD VI STRP: ORAL_STRIP | Status: DC

## 2015-02-11 ENCOUNTER — Ambulatory Visit: Payer: Self-pay | Admitting: Internal Medicine

## 2015-03-10 ENCOUNTER — Encounter: Payer: Self-pay | Admitting: Internal Medicine

## 2015-03-10 NOTE — Patient Instructions (Signed)

## 2015-03-10 NOTE — Progress Notes (Signed)
Patient ID: Sean [name], male   DOB: [birth year], 59 y.o.   MRN: [id number]   This very nice 58 y.o.male presents for 3 month follow up with Hypertension, Hyperlipidemia, Pre-Diabetes and Vitamin D Deficiency.    Patient is treated for HTN & BP has been controlled at home. Today's BP: 114/66 mmHg. Patient has had no complaints of any cardiac type chest pain, palpitations, dyspnea/orthopnea/PND, dizziness, claudication, or dependent edema.   Hyperlipidemia is controlled with diet & meds. Patient denies myalgias or other med SE's. Last Lipids were at goal -  Chol 126; HDL 24; LDL 61; and with elevated Trig 206 on 11/08/2014.   Also, the patient has history of Diabetes, he is on bASA, he is not on ACE due to hypotension, he is on metformin and januvia and has had no symptoms of reactive hypoglycemia, diabetic polys, paresthesias or visual blurring.  Last A1c was  8.2% on 11/08/2014.   Last sugar was in the 500's and last sodium was 129. Will recheck today.  Lab Results  Component Value Date   CREATININE 1.08 01/01/2015   BUN 20 01/01/2015   NA 129* 01/01/2015   K 4.6 01/01/2015   CL 96 01/01/2015   CO2 22 01/01/2015     Further, the patient also has history of Vitamin D Deficiency and supplements vitamin D without any suspected side-effects. Last vitamin D was  36 on 11/08/2014.  Medication Sig  . Albuterol Sulfate (PROAIR RESPICLICK) 108 (90 BASE) MCG/ACT AEPB Inhale 2 Act into the lungs every 6 (six) hours as needed (for shortness of breath).  . ALPRAZolam (XANAX) 0.5 MG tablet Take 1 tablet (0.5 mg total) by mouth 3 (three) times daily as needed for sleep or anxiety.  Marland Kitchen aspirin EC 81 MG tablet Take 81 mg by mouth at bedtime.  . B Complex-C (SUPER B COMPLEX PO) Take 1 tablet by mouth daily.  . fenofibrate micronized (LOFIBRA) 134 MG capsule Take 1 capsule (134 mg total) by mouth daily.  Marland Kitchen glucose blood (FREESTYLE TEST STRIPS) test strip check blood sugar 1 time daily  . metFORMIN  (GLUCOPHAGE-XR) 500 MG 24 hr tablet Take 2 tablets (1,000 mg total) by mouth 2 (two) times daily.  . Omega-3 Fatty Acids (FISH OIL) 1000 MG CAPS Take 1,000 mg by mouth 2 (two) times daily.   . sertraline (ZOLOFT) 50 MG tablet Take 1 tablet (50 mg total) by mouth daily.  . sitaGLIPtin (JANUVIA) 50 MG tablet Take 1 tablet (50 mg total) by mouth daily.  Marland Kitchen ULORIC 80 MG TABS Take 80 mg by mouth at bedtime.   . Vitamin D, Cholecalciferol, 1000 UNITS TABS Take 1,000 mg by mouth 2 (two) times daily.    Allergies  Allergen Reactions  . Levaquin [Levofloxacin]     GI Upset  . Valtrex [Valacyclovir Hcl] Hives   PMHx:   Past Medical History  Diagnosis Date  . Vitamin D deficiency   . Diabetes mellitus without complication   . H/O cluster headache    Immunization History  Administered Date(s) Administered  . Influenza Split 05/15/2013, 07/17/2014  . PPD Test 07/17/2014   History reviewed. No pertinent past surgical history.  FHx:    Reviewed / unchanged  SHx:    Reviewed / unchanged   Review of Systems  Constitutional: Negative.   HENT: Negative.   Eyes: Negative.   Respiratory: Negative.   Cardiovascular: Negative.   Gastrointestinal: Negative.   Genitourinary: Negative.   Musculoskeletal: Positive for myalgias (at night). Negative  for back pain, joint pain, falls and neck pain.  Skin: Negative.   Neurological: Negative.   Endo/Heme/Allergies: Negative.   Psychiatric/Behavioral: Negative.      Physical Exam  BP 114/66 mmHg  Pulse 72  Temp(Src) 97.7 F (36.5 C)  Resp 16  Ht 6\' 2"  (1.88 m)  Wt 210 lb 3.2 oz (95.346 kg)  BMI 26.98 kg/m2  Appears well nourished and in no distress. Eyes: PERRLA, EOMs, conjunctiva no swelling or erythema. Sinuses: No frontal/maxillary tenderness ENT/Mouth: EAC's clear, TM's nl w/o erythema, bulging. Nares clear w/o erythema, swelling, exudates. Oropharynx clear without erythema or exudates. Oral hygiene is good. Tongue normal, non  obstructing. Hearing intact.  Neck: Supple. Thyroid nl. Car 2+/2+ without bruits, nodes or JVD. Chest: Respirations nl with BS clear & equal w/o rales, rhonchi, wheezing or stridor.  Cor: Heart sounds normal w/ regular rate and rhythm without sig. murmurs, gallops, clicks, or rubs. Peripheral pulses normal and equal  without edema.  Abdomen: Soft & bowel sounds normal. Non-tender w/o guarding, rebound, hernias, masses, or organomegaly.  Lymphatics: Unremarkable.  Musculoskeletal: Full ROM all peripheral extremities, joint stability, 5/5 strength, and normal gait.  Skin: On nose, peeling, non healing ulcers, states from sun burn, will monitor. Warm, dry without exposed rashes, lesions or ecchymosis apparent.  Neuro: Cranial nerves intact, reflexes equal bilaterally. Sensory-motor testing grossly intact. Tendon reflexes grossly intact.  Pysch: Alert & oriented x 3.  Insight and judgement nl & appropriate. No ideations.  Assessment and Plan: 1. Essential hypertension - DASH diet, exercise and monitor at home. Call if greater than 130/80.  - TSH  2. Mixed hyperlipidemia -continue medications, check lipids, decrease fatty foods, increase activity.  - Lipid panel  3. Type 2 diabetes mellitus with other diabetic kidney complication Discussed general issues about diabetes pathophysiology and management., Educational material distributed., Suggested low cholesterol diet., Encouraged aerobic exercise., Discussed foot care., Reminded to get yearly retinal exam. Continue bASA, not on ACE due to BP Continue MF and januvia, may need to increase based on labs.  - Hemoglobin A1c - Insulin, random  4. Vitamin D deficiency - Vit D  25 hydroxy (rtn osteoporosis monitoring)  5. Idiopathic gout, unspecified chronicity, unspecified site monitor  6. Medication management - CBC with Differential/Platelet - BASIC METABOLIC PANEL WITH GFR - Hepatic function panel - Magnesium  7. BMI  27.0-27.9,adult  8. Hyponatremia Due to hyperglycemia, will recheck.     Recommended regular exercise, BP monitoring, weight control, and discussed med and SE's. Recommended labs to assess and monitor clinical status. Further disposition pending results of labs. Over 30 minutes of exam, counseling, chart review was performed

## 2015-03-11 ENCOUNTER — Encounter: Payer: Self-pay | Admitting: Internal Medicine

## 2015-03-11 ENCOUNTER — Other Ambulatory Visit: Payer: Self-pay | Admitting: *Deleted

## 2015-03-11 ENCOUNTER — Ambulatory Visit (INDEPENDENT_AMBULATORY_CARE_PROVIDER_SITE_OTHER): Payer: BLUE CROSS/BLUE SHIELD | Admitting: Physician Assistant

## 2015-03-11 VITALS — BP 114/66 | HR 72 | Temp 97.7°F | Resp 16 | Ht 74.0 in | Wt 210.2 lb

## 2015-03-11 DIAGNOSIS — E559 Vitamin D deficiency, unspecified: Secondary | ICD-10-CM

## 2015-03-11 DIAGNOSIS — I10 Essential (primary) hypertension: Secondary | ICD-10-CM

## 2015-03-11 DIAGNOSIS — Z79899 Other long term (current) drug therapy: Secondary | ICD-10-CM

## 2015-03-11 DIAGNOSIS — Z6827 Body mass index (BMI) 27.0-27.9, adult: Secondary | ICD-10-CM

## 2015-03-11 DIAGNOSIS — E871 Hypo-osmolality and hyponatremia: Secondary | ICD-10-CM

## 2015-03-11 DIAGNOSIS — M1000 Idiopathic gout, unspecified site: Secondary | ICD-10-CM

## 2015-03-11 DIAGNOSIS — E1129 Type 2 diabetes mellitus with other diabetic kidney complication: Secondary | ICD-10-CM

## 2015-03-11 DIAGNOSIS — E782 Mixed hyperlipidemia: Secondary | ICD-10-CM

## 2015-03-11 LAB — HEMOGLOBIN A1C
Hgb A1c MFr Bld: 8.2 % — ABNORMAL HIGH (ref <5.7)
MEAN PLASMA GLUCOSE: 189 mg/dL — AB (ref <117)

## 2015-03-11 LAB — LIPID PANEL
CHOL/HDL RATIO: 3.4 ratio
Cholesterol: 98 mg/dL (ref 0–200)
HDL: 29 mg/dL — AB (ref >=40)
LDL Cholesterol: 48 mg/dL (ref 0–99)
Triglycerides: 104 mg/dL (ref <150)
VLDL: 21 mg/dL (ref 0–40)

## 2015-03-11 LAB — BASIC METABOLIC PANEL WITH GFR
BUN: 22 mg/dL (ref 6–23)
CALCIUM: 9.6 mg/dL (ref 8.4–10.5)
CO2: 22 meq/L (ref 19–32)
Chloride: 105 mEq/L (ref 96–112)
Creat: 1.17 mg/dL (ref 0.50–1.35)
GFR, EST NON AFRICAN AMERICAN: 67 mL/min
GFR, Est African American: 78 mL/min
Glucose, Bld: 104 mg/dL — ABNORMAL HIGH (ref 70–99)
Potassium: 4.7 mEq/L (ref 3.5–5.3)
Sodium: 140 mEq/L (ref 135–145)

## 2015-03-11 LAB — HEPATIC FUNCTION PANEL
ALBUMIN: 4.5 g/dL (ref 3.5–5.2)
ALT: 21 U/L (ref 0–53)
AST: 18 U/L (ref 0–37)
Alkaline Phosphatase: 54 U/L (ref 39–117)
BILIRUBIN INDIRECT: 0.3 mg/dL (ref 0.2–1.2)
Bilirubin, Direct: 0.1 mg/dL (ref 0.0–0.3)
Total Bilirubin: 0.4 mg/dL (ref 0.2–1.2)
Total Protein: 6.9 g/dL (ref 6.0–8.3)

## 2015-03-11 LAB — MAGNESIUM: Magnesium: 1.9 mg/dL (ref 1.5–2.5)

## 2015-03-11 LAB — TSH: TSH: 1.517 u[IU]/mL (ref 0.350–4.500)

## 2015-03-11 MED ORDER — ONETOUCH BASIC SYSTEM W/DEVICE KIT: PACK | Status: DC

## 2015-03-11 MED ORDER — GLUCOSE BLOOD VI STRP: ORAL_STRIP | Status: DC

## 2015-03-11 MED ORDER — ONETOUCH LANCETS MISC: Status: DC

## 2015-03-12 LAB — CBC WITH DIFFERENTIAL/PLATELET
BASOS PCT: 0 % (ref 0–1)
Basophils Absolute: 0 10*3/uL (ref 0.0–0.1)
EOS PCT: 5 % (ref 0–5)
Eosinophils Absolute: 0.3 10*3/uL (ref 0.0–0.7)
HCT: 41.2 % (ref 39.0–52.0)
Hemoglobin: 13.7 g/dL (ref 13.0–17.0)
Lymphocytes Relative: 31 % (ref 12–46)
Lymphs Abs: 1.7 10*3/uL (ref 0.7–4.0)
MCH: 29.1 pg (ref 26.0–34.0)
MCHC: 33.3 g/dL (ref 30.0–36.0)
MCV: 87.7 fL (ref 78.0–100.0)
MONO ABS: 0.4 10*3/uL (ref 0.1–1.0)
MPV: 10.6 fL (ref 8.6–12.4)
Monocytes Relative: 8 % (ref 3–12)
NEUTROS PCT: 56 % (ref 43–77)
Neutro Abs: 3.1 10*3/uL (ref 1.7–7.7)
PLATELETS: 278 10*3/uL (ref 150–400)
RBC: 4.7 MIL/uL (ref 4.22–5.81)
RDW: 13.9 % (ref 11.5–15.5)
WBC: 5.5 10*3/uL (ref 4.0–10.5)

## 2015-03-12 LAB — INSULIN, RANDOM: Insulin: 9.7 u[IU]/mL (ref 2.0–19.6)

## 2015-03-12 LAB — VITAMIN D 25 HYDROXY (VIT D DEFICIENCY, FRACTURES): VIT D 25 HYDROXY: 61 ng/mL (ref 30–100)

## 2015-03-13 ENCOUNTER — Other Ambulatory Visit: Payer: Self-pay | Admitting: Internal Medicine

## 2015-04-29 ENCOUNTER — Encounter: Payer: Self-pay | Admitting: Internal Medicine

## 2015-04-29 ENCOUNTER — Ambulatory Visit (INDEPENDENT_AMBULATORY_CARE_PROVIDER_SITE_OTHER): Payer: BLUE CROSS/BLUE SHIELD | Admitting: Internal Medicine

## 2015-04-29 VITALS — BP 124/72 | HR 68 | Temp 98.2°F | Resp 18 | Ht 74.0 in | Wt 218.0 lb

## 2015-04-29 DIAGNOSIS — J019 Acute sinusitis, unspecified: Secondary | ICD-10-CM

## 2015-04-29 MED ORDER — PREDNISONE 20 MG PO TABS: ORAL_TABLET | ORAL | Status: DC

## 2015-04-29 MED ORDER — FLUTICASONE PROPIONATE 50 MCG/ACT NA SUSP: 2.0000 | Freq: Every day | NASAL | Status: DC

## 2015-04-29 MED ORDER — AZITHROMYCIN 250 MG PO TABS: ORAL_TABLET | ORAL | Status: DC

## 2015-04-29 NOTE — Progress Notes (Signed)
Patient ID: Sean [name], male   DOB: 1955-12-23, 59 y.o.   MRN: [id number]  HPI  Patient presents to the office for evaluation of cough.  It has been going on for 3 weeks.  Patient reports dry cough which makes him feel like he has to clear his throat.  They also endorse change in voice, postnasal drip and congestions, sinus headache, sore throats.  .  They have tried sinus rinse and cetirizine.  They report that nothing has worked.  They admits to other sick contacts.  He reports that his son had very similar symptoms.  He does normally have very severe allergies.  He doesn't take anything to help with headaches.  He did buy excedrine to try to help but that didn't help at all.    ROS  PE:  Filed Vitals:   04/29/15 1432  BP: 124/72  Pulse: 68  Temp: 98.2 F (36.8 C)  Resp: 18    General:  Alert and non-toxic, WDWN, NAD HEENT: NCAT, PERLA, EOM normal, no occular discharge or erythema.  Nasal mucosal edema with sinus tenderness to palpation.  Oropharynx clear with minimal oropharyngeal edema and erythema.  Mucous membranes moist and pink. Neck:  Cervical adenopathy Chest:  RRR no MRGs.  Lungs clear to auscultation A&P with no wheezes rhonchi or rales.   Abdomen: +BS x 4 quadrants, soft, non-tender, no guarding, rigidity, or rebound. Skin: warm and dry no rash Neuro: A&Ox4, CN II-XII grossly intact  Assessment and Plan:   1. Acute sinusitis, recurrence not specified, unspecified location -zpak -diclofenac -prednisone -flonase -zyrtec -nasal saline

## 2015-04-29 NOTE — Patient Instructions (Signed)

## 2015-06-18 ENCOUNTER — Ambulatory Visit (INDEPENDENT_AMBULATORY_CARE_PROVIDER_SITE_OTHER): Payer: BLUE CROSS/BLUE SHIELD | Admitting: Physician Assistant

## 2015-06-18 ENCOUNTER — Encounter: Payer: Self-pay | Admitting: Physician Assistant

## 2015-06-18 VITALS — BP 118/70 | HR 86 | Temp 97.0°F | Resp 16 | Ht 74.0 in | Wt 219.0 lb

## 2015-06-18 DIAGNOSIS — E782 Mixed hyperlipidemia: Secondary | ICD-10-CM | POA: Diagnosis not present

## 2015-06-18 DIAGNOSIS — Z79899 Other long term (current) drug therapy: Secondary | ICD-10-CM

## 2015-06-18 DIAGNOSIS — Z23 Encounter for immunization: Secondary | ICD-10-CM | POA: Diagnosis not present

## 2015-06-18 DIAGNOSIS — E559 Vitamin D deficiency, unspecified: Secondary | ICD-10-CM

## 2015-06-18 DIAGNOSIS — K760 Fatty (change of) liver, not elsewhere classified: Secondary | ICD-10-CM | POA: Diagnosis not present

## 2015-06-18 DIAGNOSIS — I10 Essential (primary) hypertension: Secondary | ICD-10-CM | POA: Diagnosis not present

## 2015-06-18 DIAGNOSIS — E1129 Type 2 diabetes mellitus with other diabetic kidney complication: Secondary | ICD-10-CM

## 2015-06-18 LAB — CBC WITH DIFFERENTIAL/PLATELET
BASOS PCT: 0 % (ref 0–1)
Basophils Absolute: 0 10*3/uL (ref 0.0–0.1)
EOS PCT: 3 % (ref 0–5)
Eosinophils Absolute: 0.2 10*3/uL (ref 0.0–0.7)
HCT: 40.7 % (ref 39.0–52.0)
Hemoglobin: 13.8 g/dL (ref 13.0–17.0)
Lymphocytes Relative: 36 % (ref 12–46)
Lymphs Abs: 2.2 10*3/uL (ref 0.7–4.0)
MCH: 28.7 pg (ref 26.0–34.0)
MCHC: 33.9 g/dL (ref 30.0–36.0)
MCV: 84.6 fL (ref 78.0–100.0)
MONO ABS: 0.5 10*3/uL (ref 0.1–1.0)
MPV: 10.4 fL (ref 8.6–12.4)
Monocytes Relative: 9 % (ref 3–12)
Neutro Abs: 3.1 10*3/uL (ref 1.7–7.7)
Neutrophils Relative %: 52 % (ref 43–77)
Platelets: 255 10*3/uL (ref 150–400)
RBC: 4.81 MIL/uL (ref 4.22–5.81)
RDW: 14.6 % (ref 11.5–15.5)
WBC: 6 10*3/uL (ref 4.0–10.5)

## 2015-06-18 LAB — HEMOGLOBIN A1C
Hgb A1c MFr Bld: 6.8 % — ABNORMAL HIGH (ref <5.7)
Mean Plasma Glucose: 148 mg/dL — ABNORMAL HIGH (ref <117)

## 2015-06-18 MED ORDER — SITAGLIPTIN PHOSPHATE 100 MG PO TABS: 100.0000 mg | ORAL_TABLET | Freq: Every day | ORAL | Status: DC

## 2015-06-18 NOTE — Patient Instructions (Signed)
Diabetes is a very complicated disease...lets simplify it.  An easy way to look at it to understand the complications is if you think of the extra sugar floating in your blood stream as glass shards floating through your blood stream.    Diabetes affects your small vessels first: 1) The glass shards (sugar) scraps down the tiny blood vessels in your eyes and lead to diabetic retinopathy, the leading cause of blindness in the [place]. Diabetes is the leading cause of newly diagnosed adult (20 to 61 years of age) blindness in the [place].  2) The glass shards scratches down the tiny vessels of your legs leading to nerve damage called neuropathy and can lead to amputations of your feet. More than 60% of all non-traumatic amputations of lower limbs occur in people with diabetes.  3) Over time the small vessels in your brain are shredded and closed off, individually this does not cause any problems but over a long period of time many of the small vessels being blocked can lead to Vascular Dementia.   4) Your kidney's are a filter system and have a "net" that keeps certain things in the body and lets bad things out. Sugar shreds this net and leads to kidney damage and eventually failure. Decreasing the sugar that is destroying the net and certain blood pressure medications can help stop or decrease progression of kidney disease. Diabetes was the primary cause of kidney failure in 44 percent of all new cases in 2011.  5) Diabetes also destroys the small vessels in your penis that lead to erectile dysfunction. Eventually the vessels are so damaged that you may not be responsive to cialis or viagra.   Diabetes and your large vessels: Your larger vessels consist of your coronary arteries in your heart and the carotid vessels to your brain. Diabetes or even increased sugars put you at 300% increased risk of heart attack and stroke and this is why.. The sugar scrapes down your large blood vessels and your body  sees this as an internal injury and tries to repair itself. Just like you get a scab on your skin, your platelets will stick to the blood vessel wall trying to heal it. This is why we have diabetics on low dose aspirin daily, this prevents the platelets from sticking and can prevent plaque formation. In addition, your body takes cholesterol and tries to shove it into the open wound. This is why we want your LDL, or bad cholesterol, below 70.   The combination of platelets and cholesterol over 5-10 years forms plaque that can break off and cause a heart attack or stroke.   PLEASE REMEMBER:  Diabetes is preventable! Up to 85 percent of complications and morbidities among individuals with type 2 diabetes can be prevented, delayed, or effectively treated and minimized with regular visits to a health professional, appropriate monitoring and medication, and a healthy diet and lifestyle.     Bad carbs also include fruit juice, alcohol, and sweet tea. These are empty calories that do not signal to your brain that you are full.   Please remember the good carbs are still carbs which convert into sugar. So please measure them out no more than 1/2-1 cup of rice, oatmeal, pasta, and beans  Veggies are however free foods! Pile them on.   Not all fruit is created equal. Please see the list below, the fruit at the bottom is higher in sugars than the fruit at the top. Please avoid all dried fruits.      Recommendations For Diabetic/Prediabetic Patients:   -  Take medications as prescribed  -  Recommend Dr Joel Fuhrman's book "The End of Diabetes "  And "The End of Dieting"- Can get at  www.Amazon.com and encourage also get the Audio CD book  - AVOID Animal products, ie. Meat - red/white, Poultry and Dairy/especially cheese - Exercise at least 5 times a week for 30 minutes or preferably daily.  - No Smoking - Drink less than 2 drinks a day.  - Monitor your feet for sores - Have yearly Eye Exams - Recommend  annual Flu vaccine  - Recommend Pneumovax and Prevnar vaccines - Shingles Vaccine (Zostavax) if over 60 y.o.  Goals:   - BMI less than 24 - Fasting sugar less than 130 or less than 150 if tapering medicines to lose weight  - Systolic BP less than 130  - Diastolic BP less than 80 - Bad LDL Cholesterol less than 70 - Triglycerides less than 150   

## 2015-06-18 NOTE — Progress Notes (Signed)
Assessment and Plan:  Hypertension: Continue medication, monitor blood pressure at home. Continue DASH diet.  Reminder to go to the ER if any CP, SOB, nausea, dizziness, severe HA, changes vision/speech, left arm numbness and tingling and jaw pain. Cholesterol: Continue diet and exercise. Check cholesterol.  Diabetes with diabetic chronic kidney disease-Continue diet and exercise. INCREASE JANUVIA TO 100MG, CONT METFORMIN, Check A1C Vitamin D Def- check level and continue medications.  Gout- recheck Uric acid as needed, Diet discussed, continue medications.  Continue diet and meds as discussed. Further disposition pending results of labs. Discussed med's effects and SE's.    HPI 59 y.o. male  presents for 3 month follow up HTN, Chol, DM with CKD, and vitamin D def.   His blood pressure has been controlled at home, today his BP is BP: 118/70 mmHg.  He does not workout during cold weather but will start more with the nicer temps. He denies chest pain, shortness of breath, dizziness.  He is on cholesterol medication, fenofibrate/lipitor and denies myalgias. His cholesterol is at goal. The cholesterol was:  03/10/2015: Cholesterol 98; HDL 29*; LDL Cholesterol 48; Triglycerides 104  He has been working on diet and exercise for diabetes with diabetic chronic kidney disease, he is on bASA, he is not on ACE/ARB due to hypotension, MF 2055m and januvia 546mdaily, this AM was 120, no hypoglycemia, and denies  paresthesia of the feet, polydipsia, polyuria and visual disturbances. Last A1C was: 03/10/2015: Hgb A1c MFr Bld 8.2*  Lab Results  Component Value Date   GFRNONAA 67 03/10/2015   Patient is on Vitamin D supplement. 03/10/2015: Vit D, 25-Hydroxy 61 He sees Dr. HaTrudie Reedor gout and is on uloric, no flares.   Current Medications:  Current Outpatient Prescriptions on File Prior to Visit  Medication Sig Dispense Refill  . aspirin EC 81 MG tablet Take 81 mg by mouth at bedtime.    . Marland Kitchenzithromycin  (ZITHROMAX Z-PAK) 250 MG tablet 2 po day one, then 1 daily x 4 days 5 tablet 0  . B Complex-C (SUPER B COMPLEX PO) Take 1 tablet by mouth daily.    . Blood Glucose Monitoring Suppl ([place]/DEVICE KIT Patient checks blood sugar 1 time daily-DX-E11.21. Per insurance needs Lifescan system. 1 each 0  . fenofibrate micronized (LOFIBRA) 134 MG capsule Take 1 capsule (134 mg total) by mouth daily. 90 capsule 3  . fluticasone (FLONASE) 50 MCG/ACT nasal spray Place 2 sprays into both nostrils daily. 16 g 0  . glucose blood (ONE TOUCH TEST STRIPS) test strip Check blood sugar 1 time daily-DX-E11.21.  Needs Lifescan system. 100 each 4  . metFORMIN (GLUCOPHAGE-XR) 500 MG 24 hr tablet Take 2 tablets (1,000 mg total) by mouth 2 (two) times daily. 360 tablet 3  . Omega-3 Fatty Acids (FISH OIL) 1000 MG CAPS Take 1,000 mg by mouth 2 (two) times daily.     . ONE TOUCH LANCETS MISC Check boold sugar 1 time daily-DX-E11.21. Needs lifescan system 200 each 1  . predniSONE (DELTASONE) 20 MG tablet 3 tabs po daily x 3 days, then 2 tabs x 3 days, then 1.5 tabs x 3 days, then 1 tab x 3 days, then 0.5 tabs x 3 days 27 tablet 0  . sitaGLIPtin (JANUVIA) 50 MG tablet Take 1 tablet (50 mg total) by mouth daily. 90 tablet 3  . ULORIC 80 MG TABS Take 80 mg by mouth at bedtime.   0  . Vitamin D, Cholecalciferol, 1000 UNITS  TABS Take 1,000 mg by mouth 2 (two) times daily.      No current facility-administered medications on file prior to visit.   Medical History:  Past Medical History  Diagnosis Date  . Vitamin D deficiency   . Diabetes mellitus without complication ([place])   . H/O cluster headache    Allergies:  Allergies  Allergen Reactions  . Levaquin [Levofloxacin]     GI Upset  . Valtrex [Valacyclovir Hcl] Hives     Review of Systems:  Review of Systems  Constitutional: Negative.   HENT: Negative.   Eyes: Negative.   Respiratory: Negative.   Cardiovascular: Negative.   Gastrointestinal:  Negative.   Genitourinary: Negative.   Musculoskeletal: Negative.   Skin: Negative.   Neurological: Negative.   Endo/Heme/Allergies: Negative.   Psychiatric/Behavioral: Negative.     Family history- Review and unchanged Social history- Review and unchanged Physical Exam: BP 118/70 mmHg  Pulse 86  Temp(Src) 97 F (36.1 C) (Temporal)  Resp 16  Ht _0  (1.88 m)  Wt 219 lb (99.338 kg)  BMI 28.11 kg/m2  SpO2 98% Wt Readings from Last 3 Encounters:  06/18/15 219 lb (99.338 kg)  04/29/15 218 lb (98.884 kg)  03/11/15 210 lb 3.2 oz (95.346 kg)   General Appearance: Well nourished, in no apparent distress. Eyes: PERRLA, EOMs, conjunctiva no swelling or erythema Sinuses: No Frontal/maxillary tenderness ENT/Mouth: Ext aud canals clear, TMs without erythema, bulging. No erythema, swelling, or exudate on post pharynx.  Tonsils not swollen or erythematous. Hearing normal.  Neck: Supple, thyroid normal.  Respiratory: Respiratory effort normal, BS equal bilaterally without rales, rhonchi, wheezing or stridor.  Cardio: RRR with no MRGs. Brisk peripheral pulses without edema.  Abdomen: Soft, + BS.  Non tender, no guarding, rebound, hernias, masses. Lymphatics: Non tender without lymphadenopathy.  Musculoskeletal: Full ROM, 5/5 strength, Normal gait Skin: Warm, dry without rashes, lesions, ecchymosis.  Neuro: Cranial nerves intact. No cerebellar symptoms.  Psych: Awake and oriented X 3, normal affect, Insight and Judgment appropriate.    Vicie Mutters, PA-C 9:01 AM [facility]

## 2015-06-19 LAB — HEPATIC FUNCTION PANEL
ALT: 23 U/L (ref 9–46)
AST: 19 U/L (ref 10–35)
Albumin: 4.6 g/dL (ref 3.6–5.1)
Alkaline Phosphatase: 47 U/L (ref 40–115)
BILIRUBIN DIRECT: 0.1 mg/dL (ref <=0.2)
BILIRUBIN INDIRECT: 0.3 mg/dL (ref 0.2–1.2)
TOTAL PROTEIN: 7.1 g/dL (ref 6.1–8.1)
Total Bilirubin: 0.4 mg/dL (ref 0.2–1.2)

## 2015-06-19 LAB — BASIC METABOLIC PANEL WITH GFR
BUN: 18 mg/dL (ref 7–25)
CALCIUM: 9.9 mg/dL (ref 8.6–10.3)
CO2: 24 mmol/L (ref 20–31)
Chloride: 105 mmol/L (ref 98–110)
Creat: 1.15 mg/dL (ref 0.70–1.25)
GFR, EST AFRICAN AMERICAN: 79 mL/min (ref >=60)
GFR, EST NON AFRICAN AMERICAN: 68 mL/min (ref >=60)
GLUCOSE: 99 mg/dL (ref 65–99)
Potassium: 4.8 mmol/L (ref 3.5–5.3)
SODIUM: 140 mmol/L (ref 135–146)

## 2015-06-19 LAB — INSULIN, FASTING: INSULIN FASTING, SERUM: 7.5 u[IU]/mL (ref 2.0–19.6)

## 2015-06-19 LAB — TSH: TSH: 1.423 u[IU]/mL (ref 0.350–4.500)

## 2015-06-19 LAB — LIPID PANEL
CHOLESTEROL: 122 mg/dL — AB (ref 125–200)
HDL: 32 mg/dL — ABNORMAL LOW (ref >=40)
LDL Cholesterol: 64 mg/dL (ref <130)
TRIGLYCERIDES: 128 mg/dL (ref <150)
Total CHOL/HDL Ratio: 3.8 Ratio (ref <=5.0)
VLDL: 26 mg/dL (ref <30)

## 2015-06-19 LAB — VITAMIN D 25 HYDROXY (VIT D DEFICIENCY, FRACTURES): Vit D, 25-Hydroxy: 43 ng/mL (ref 30–100)

## 2015-06-19 LAB — MAGNESIUM: MAGNESIUM: 1.9 mg/dL (ref 1.5–2.5)

## 2015-07-12 ENCOUNTER — Other Ambulatory Visit: Payer: Self-pay

## 2015-07-12 MED ORDER — SITAGLIPTIN PHOSPHATE 100 MG PO TABS: 100.0000 mg | ORAL_TABLET | Freq: Every day | ORAL | Status: DC

## 2015-07-16 ENCOUNTER — Other Ambulatory Visit: Payer: Self-pay | Admitting: *Deleted

## 2015-07-16 MED ORDER — FEBUXOSTAT 80 MG PO TABS: 80.0000 mg | ORAL_TABLET | Freq: Every day | ORAL | Status: DC

## 2015-08-02 ENCOUNTER — Encounter: Payer: Self-pay | Admitting: Internal Medicine

## 2015-09-24 ENCOUNTER — Ambulatory Visit: Payer: BLUE CROSS/BLUE SHIELD | Admitting: Internal Medicine

## 2015-09-24 MED ORDER — SITAGLIPTIN PHOSPHATE 100 MG PO TABS: 100.0000 mg | ORAL_TABLET | Freq: Every day | ORAL | Status: DC

## 2015-09-25 NOTE — Progress Notes (Signed)
Patient ID: Sean [name], male   DOB: [birth year], 60 y.o.   MRN: [id number] R  E  S  C  H  E  D  U  L  E  D

## 2015-09-30 ENCOUNTER — Encounter: Payer: Self-pay | Admitting: Physician Assistant

## 2015-09-30 ENCOUNTER — Ambulatory Visit (INDEPENDENT_AMBULATORY_CARE_PROVIDER_SITE_OTHER): Payer: BLUE CROSS/BLUE SHIELD | Admitting: Internal Medicine

## 2015-09-30 ENCOUNTER — Encounter: Payer: Self-pay | Admitting: Internal Medicine

## 2015-09-30 VITALS — BP 114/76 | HR 72 | Temp 97.3°F | Resp 16 | Ht 75.5 in | Wt 219.4 lb

## 2015-09-30 DIAGNOSIS — Z125 Encounter for screening for malignant neoplasm of prostate: Secondary | ICD-10-CM | POA: Diagnosis not present

## 2015-09-30 DIAGNOSIS — Z0000 Encounter for general adult medical examination without abnormal findings: Secondary | ICD-10-CM | POA: Diagnosis not present

## 2015-09-30 DIAGNOSIS — E1121 Type 2 diabetes mellitus with diabetic nephropathy: Secondary | ICD-10-CM

## 2015-09-30 DIAGNOSIS — E1122 Type 2 diabetes mellitus with diabetic chronic kidney disease: Secondary | ICD-10-CM

## 2015-09-30 DIAGNOSIS — E559 Vitamin D deficiency, unspecified: Secondary | ICD-10-CM | POA: Diagnosis not present

## 2015-09-30 DIAGNOSIS — Z111 Encounter for screening for respiratory tuberculosis: Secondary | ICD-10-CM | POA: Diagnosis not present

## 2015-09-30 DIAGNOSIS — I10 Essential (primary) hypertension: Secondary | ICD-10-CM | POA: Diagnosis not present

## 2015-09-30 DIAGNOSIS — Z0001 Encounter for general adult medical examination with abnormal findings: Secondary | ICD-10-CM

## 2015-09-30 DIAGNOSIS — Z1212 Encounter for screening for malignant neoplasm of rectum: Secondary | ICD-10-CM

## 2015-09-30 DIAGNOSIS — M1000 Idiopathic gout, unspecified site: Secondary | ICD-10-CM

## 2015-09-30 DIAGNOSIS — E782 Mixed hyperlipidemia: Secondary | ICD-10-CM

## 2015-09-30 DIAGNOSIS — Z23 Encounter for immunization: Secondary | ICD-10-CM

## 2015-09-30 DIAGNOSIS — N182 Chronic kidney disease, stage 2 (mild): Secondary | ICD-10-CM

## 2015-09-30 DIAGNOSIS — Z79899 Other long term (current) drug therapy: Secondary | ICD-10-CM | POA: Diagnosis not present

## 2015-09-30 DIAGNOSIS — R5383 Other fatigue: Secondary | ICD-10-CM

## 2015-09-30 DIAGNOSIS — K760 Fatty (change of) liver, not elsewhere classified: Secondary | ICD-10-CM

## 2015-09-30 LAB — URINALYSIS, ROUTINE W REFLEX MICROSCOPIC
Bilirubin Urine: NEGATIVE
Glucose, UA: NEGATIVE
Hgb urine dipstick: NEGATIVE
Ketones, ur: NEGATIVE
LEUKOCYTES UA: NEGATIVE
NITRITE: NEGATIVE
PROTEIN: NEGATIVE
Specific Gravity, Urine: 1.018 (ref 1.001–1.035)
pH: 7 (ref 5.0–8.0)

## 2015-09-30 LAB — BASIC METABOLIC PANEL WITH GFR
BUN: 19 mg/dL (ref 7–25)
CALCIUM: 9.8 mg/dL (ref 8.6–10.3)
CO2: 25 mmol/L (ref 20–31)
CREATININE: 1.18 mg/dL (ref 0.70–1.25)
Chloride: 104 mmol/L (ref 98–110)
GFR, EST AFRICAN AMERICAN: 77 mL/min (ref >=60)
GFR, Est Non African American: 66 mL/min (ref >=60)
Glucose, Bld: 93 mg/dL (ref 65–99)
Potassium: 4.8 mmol/L (ref 3.5–5.3)
SODIUM: 138 mmol/L (ref 135–146)

## 2015-09-30 LAB — IRON AND TIBC
%SAT: 22 % (ref 15–60)
IRON: 79 ug/dL (ref 50–180)
TIBC: 363 ug/dL (ref 250–425)
UIBC: 284 ug/dL (ref 125–400)

## 2015-09-30 LAB — TSH: TSH: 0.88 u[IU]/mL (ref 0.350–4.500)

## 2015-09-30 LAB — HEPATIC FUNCTION PANEL
ALT: 23 U/L (ref 9–46)
AST: 19 U/L (ref 10–35)
Albumin: 4.6 g/dL (ref 3.6–5.1)
Alkaline Phosphatase: 50 U/L (ref 40–115)
BILIRUBIN DIRECT: 0.1 mg/dL (ref <=0.2)
Indirect Bilirubin: 0.2 mg/dL (ref 0.2–1.2)
Total Bilirubin: 0.3 mg/dL (ref 0.2–1.2)
Total Protein: 7.1 g/dL (ref 6.1–8.1)

## 2015-09-30 LAB — CBC WITH DIFFERENTIAL/PLATELET
BASOS PCT: 0 % (ref 0–1)
Basophils Absolute: 0 10*3/uL (ref 0.0–0.1)
EOS ABS: 0.3 10*3/uL (ref 0.0–0.7)
EOS PCT: 4 % (ref 0–5)
HCT: 44.9 % (ref 39.0–52.0)
HEMOGLOBIN: 15.2 g/dL (ref 13.0–17.0)
Lymphocytes Relative: 35 % (ref 12–46)
Lymphs Abs: 2.2 10*3/uL (ref 0.7–4.0)
MCH: 28.6 pg (ref 26.0–34.0)
MCHC: 33.9 g/dL (ref 30.0–36.0)
MCV: 84.4 fL (ref 78.0–100.0)
MONO ABS: 0.5 10*3/uL (ref 0.1–1.0)
MONOS PCT: 8 % (ref 3–12)
MPV: 10.7 fL (ref 8.6–12.4)
NEUTROS ABS: 3.3 10*3/uL (ref 1.7–7.7)
NEUTROS PCT: 53 % (ref 43–77)
PLATELETS: 283 10*3/uL (ref 150–400)
RBC: 5.32 MIL/uL (ref 4.22–5.81)
RDW: 13.6 % (ref 11.5–15.5)
WBC: 6.3 10*3/uL (ref 4.0–10.5)

## 2015-09-30 LAB — LIPID PANEL
CHOL/HDL RATIO: 4 ratio (ref <=5.0)
CHOLESTEROL: 115 mg/dL — AB (ref 125–200)
HDL: 29 mg/dL — ABNORMAL LOW (ref >=40)
LDL Cholesterol: 47 mg/dL (ref <130)
TRIGLYCERIDES: 194 mg/dL — AB (ref <150)
VLDL: 39 mg/dL — ABNORMAL HIGH (ref <30)

## 2015-09-30 LAB — URIC ACID: URIC ACID, SERUM: 2.7 mg/dL — AB (ref 4.0–7.8)

## 2015-09-30 LAB — MAGNESIUM: MAGNESIUM: 2 mg/dL (ref 1.5–2.5)

## 2015-09-30 LAB — VITAMIN B12: VITAMIN B 12: 325 pg/mL (ref 211–911)

## 2015-09-30 MED ORDER — SITAGLIPTIN PHOSPHATE 100 MG PO TABS: ORAL_TABLET | ORAL | Status: DC

## 2015-09-30 MED ORDER — METFORMIN HCL ER 500 MG PO TB24
1000.0000 mg | ORAL_TABLET | Freq: Two times a day (BID) | ORAL | Status: DC
Start: 2015-09-30 — End: 2016-09-21

## 2015-09-30 MED ORDER — FENOFIBRATE MICRONIZED 134 MG PO CAPS: ORAL_CAPSULE | ORAL | Status: DC

## 2015-09-30 MED ORDER — FEBUXOSTAT 80 MG PO TABS
ORAL_TABLET | ORAL | Status: DC
Start: 2015-09-30 — End: 2016-04-08

## 2015-09-30 NOTE — Patient Instructions (Signed)

## 2015-09-30 NOTE — Progress Notes (Signed)
Patient ID: Sean [name], male   DOB: 09-12-1955, 60 y.o.   MRN: [id number]  Annual  Screening/Preventative Visit And Comprehensive Evaluation & Examination  This very nice 60 y.o. WWM presents for presents for a Wellness/Preventative Visit & comprehensive evaluation and management of multiple medical co-morbidities.  Patient has been followed for HTN, T2_NIDDM  , Hyperlipidemia and Vitamin D Deficiency.   HTN predates since 2010 with a BP of 158/82 and since then monitoring BP's have been in normal range. Patient's BP has been controlled and today's BP: 114/76 mmHg. Patient denies any cardiac symptoms as chest pain, palpitations, shortness of breath, dizziness or ankle swelling.   Patient has  hyperlipidemia controlled with diet and medications. Patient denies myalgias or other medication SE's. Last lipids were  Cholesterol 122*; HDL 32*; LDL 64; Triglycerides 128 on 06/18/2015.   Patient has T2_NIDDM since June 2013, but wasn't started on Metformin until Mar 2014.  Patient has CKD 2 with GFR 63 ml/min. Patient denies reactive hypoglycemic symptoms, visual blurring, diabetic polys or paresthesias. He reports FBG's run less than 120 mg%. Last A1c was 6.8% on 06/18/2015.    Patient has hx/o Gout predating to 2007 and has been controlled & asymptomatic on Uloric (was intolerant to Allopurinol). Finally, patient has history of Vitamin D Deficiency of 34 in 2012 and last vitamin D was still relatively low at 43 on 06/18/2015.    Medication Sig  . aspirin EC 81 MG tablet Take 81 mg by mouth at bedtime.  . SUPER B COMPLEX Take 1 tablet by mouth daily.  . Febuxostat (ULORIC) 80 MG TABS Take 1 tablet (80 mg total) by mouth at bedtime.  . fenofibrate  134 MG capsule Take 1 capsule (134 mg total) by mouth daily.  . metFORMIN-XR) 500 MG 24 hr tablet Take 2 tablets (1,000 mg total) by mouth 2 (two) times daily.  . Omega-3 FISH OIL 1000 MG CAPS Take 1,000 mg by mouth 2 (two) times daily.   . sitaGLIPtin  (JANUVIA) 100 MG tablet Take 1 tablet (100 mg total) by mouth daily.  . Vitamin D1000 UNITS TABS Take 1,000 mg by mouth 2 (two) times daily.    Allergies  Allergen Reactions  . Levaquin [Levofloxacin]     GI Upset   Allopurinol  rash  . Valtrex [Valacyclovir Hcl] Hives   Past Medical History  Diagnosis Date  . Vitamin D deficiency   . Diabetes mellitus without complication (HCC)   . H/O cluster headache    Health Maintenance  Topic Date Due  . PNEUMOCOCCAL POLYSACCHARIDE VACCINE (1) 04/26/1956  . OPHTHALMOLOGY EXAM  04/26/1964  . TETANUS/TDAP  04/26/1973  . COLONOSCOPY  04/26/2004  . ZOSTAVAX  04/26/2014  . FOOT EXAM  11/23/2014  . URINE MICROALBUMIN  07/18/2015  . HEMOGLOBIN A1C  12/17/2015  . INFLUENZA VACCINE  03/31/2016  . Hepatitis C Screening  Completed  . HIV Screening  Completed   Immunization History  Administered Date(s) Administered  . Influenza Split 05/15/2013, 07/17/2014, 06/18/2015  . PPD Test 07/17/2014   No past surgical history on file. Family History  Problem Relation Age of Onset  . Hypertension Father   . Diabetes Father   . Heart attack Father    Social History   Social History  . Marital Status: Married    Spouse Name: N/A  . Number of Children: N/A  . Years of Education: N/A   Occupational History  . 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   Social History Main  Topics  . Smoking status: Never Smoker   . Smokeless tobacco: Not on file  . Alcohol Use: No  . Drug Use: Not on file  . Sexual Activity: Inactive since widowed last year.     ROS Constitutional: Denies fever, chills, weight loss/gain, headaches, insomnia,  night sweats or change in appetite. Does c/o fatigue. Eyes: Denies redness, blurred vision, diplopia, discharge, itchy or watery eyes.  ENT: Denies discharge, congestion, post nasal drip, epistaxis, sore throat, earache, hearing loss, dental pain, Tinnitus, Vertigo, Sinus pain or snoring.  Cardio: Denies chest pain,  palpitations, irregular heartbeat, syncope, dyspnea, diaphoresis, orthopnea, PND, claudication or edema Respiratory: denies cough, dyspnea, DOE, pleurisy, hoarseness, laryngitis or wheezing.  Gastrointestinal: Denies dysphagia, heartburn, reflux, water brash, pain, cramps, nausea, vomiting, bloating, diarrhea, constipation, hematemesis, melena, hematochezia, jaundice or hemorrhoids Genitourinary: Denies dysuria, frequency, urgency, nocturia, hesitancy, discharge, hematuria or flank pain Musculoskeletal: Denies arthralgia, myalgia, stiffness, Jt. Swelling, pain, limp or strain/sprain. Denies Falls. Skin: Denies puritis, rash, hives, warts, acne, eczema or change in skin lesion Neuro: No weakness, tremor, incoordination, spasms, paresthesia or pain Psychiatric: Denies confusion, memory loss or sensory loss. Denies Depression. Endocrine: Denies change in weight, skin, hair change, nocturia, and paresthesia, diabetic polys, visual blurring or hyper / hypo glycemic episodes.  Heme/Lymph: No excessive bleeding, bruising or enlarged lymph nodes.  Physical Exam  BP 114/76 mmHg  Pulse 72  Temp(Src) 97.3 F (36.3 C)  Resp 16  Ht 6' 3.5" (1.918 m)  Wt 219 lb 6.4 oz (99.519 kg)  BMI 27.05 kg/m2  General Appearance: Well nourished, in no apparent distress. Eyes: PERRLA, EOMs, conjunctiva no swelling or erythema, normal fundi and vessels. Sinuses: No frontal/maxillary tenderness ENT/Mouth: EACs patent / TMs  nl. Nares clear without erythema, swelling, mucoid exudates. Oral hygiene is good. No erythema, swelling, or exudate. Tongue normal, non-obstructing. Tonsils not swollen or erythematous. Hearing normal.  Neck: Supple, thyroid normal. No bruits, nodes or JVD. Respiratory: Respiratory effort normal.  BS equal and clear bilateral without rales, rhonci, wheezing or stridor. Cardio: Heart sounds are normal with regular rate and rhythm and no murmurs, rubs or gallops. Peripheral pulses are normal and  equal bilaterally without edema. No aortic or femoral bruits. Chest: symmetric with normal excursions and percussion.  Abdomen: Soft, with Nl bowel sounds. Nontender, no guarding, rebound, hernias, masses, or organomegaly.  Lymphatics: Non tender without lymphadenopathy.  Genitourinary: No hernias.Testes nl. DRE - prostate nl for age - smooth & firm w/o nodules. Musculoskeletal: Full ROM all peripheral extremities, joint stability, 5/5 strength and normal gait. Skin: Warm and dry without rashes, lesions, cyanosis, clubbing or  ecchymosis.  Neuro: Cranial nerves intact, reflexes equal bilaterally. Normal muscle tone, no cerebellar symptoms. Sensation intact to touch, vibratory and Monofilament testing to the toe bilaterally.  Pysch: Alert and oriented X 3 with normal affect, insight and judgment appropriate.   Assessment and Plan  1. Annual Preventative/Screening Exam   1. Encounter for general adult medical examination with abnormal findings  - Microalbumin / creatinine urine ratio - EKG 12-Lead - [place], RETROPERITNL ABD,  LTD - POC Hemoccult Bld/Stl  - Urinalysis, Routine w reflex microscopic  - Vitamin B12 - Iron and TIBC - PSA - Testosterone - Uric acid - HM DIABETES FOOT EXAM - LOW EXTREMITY NEUR EXAM DOCUM - CBC with Differential/Platelet - BASIC METABOLIC PANEL WITH GFR - Hepatic function panel - Magnesium - Lipid panel - TSH - Hemoglobin A1c - Insulin, random - VITAMIN D 25 Hydroxy   2.  Essential hypertension  - EKG 12-Lead - [place], RETROPERITNL ABD,  LTD - TSH  3. Mixed hyperlipidemia  - Lipid panel - TSH  4. Type 2 diabetes mellitus with stage 2 chronic kidney disease, without long-term current use of insulin (HCC)  - Microalbumin / creatinine urine ratio - HM DIABETES FOOT EXAM - LOW EXTREMITY NEUR EXAM DOCUM - Hemoglobin A1c - Insulin, random  5. Vitamin D deficiency  - VITAMIN D 25 Hydroxy   6. Idiopathic gout  - Uric acid  7. Fatty liver  disease, nonalcoholic   8. Screening for rectal cancer  - POC Hemoccult Bld/Stl   9. Prostate cancer screening  - PSA - Testosterone  10. Other fatigue  - Vitamin B12 - Iron and TIBC - TSH  11. Medication management  - Urinalysis, Routine w reflex microscopic  - CBC with Differential/Platelet - BASIC METABOLIC PANEL WITH GFR - Hepatic function panel - Magnesium   Continue prudent diet as discussed, weight control, BP monitoring, regular exercise, and medications as discussed.  Discussed med effects and SE's. Routine screening labs and tests as requested with regular follow-up as recommended. Over 40 minutes of exam, counseling, chart review and high complex critical decision making was performed

## 2015-10-01 LAB — VITAMIN D 25 HYDROXY (VIT D DEFICIENCY, FRACTURES): VIT D 25 HYDROXY: 34 ng/mL (ref 30–100)

## 2015-10-01 LAB — MICROALBUMIN / CREATININE URINE RATIO
Creatinine, Urine: 124 mg/dL (ref 20–370)
Microalb, Ur: <0.2 mg/dL

## 2015-10-01 LAB — TESTOSTERONE: Testosterone: 243 ng/dL — ABNORMAL LOW (ref 250–827)

## 2015-10-01 LAB — HEMOGLOBIN A1C
HEMOGLOBIN A1C: 6.5 % — AB (ref <5.7)
MEAN PLASMA GLUCOSE: 140 mg/dL — AB (ref <117)

## 2015-10-01 LAB — PSA: PSA: 0.33 ng/mL (ref <=4.00)

## 2015-10-01 LAB — INSULIN, RANDOM: Insulin: 12 u[IU]/mL (ref 2.0–19.6)

## 2015-10-02 LAB — TB SKIN TEST
INDURATION: 0 mm
TB SKIN TEST: NEGATIVE

## 2015-12-26 ENCOUNTER — Other Ambulatory Visit: Payer: Self-pay | Admitting: Internal Medicine

## 2016-01-06 ENCOUNTER — Ambulatory Visit: Payer: Self-pay | Admitting: Internal Medicine

## 2016-02-07 ENCOUNTER — Ambulatory Visit (INDEPENDENT_AMBULATORY_CARE_PROVIDER_SITE_OTHER): Payer: BLUE CROSS/BLUE SHIELD | Admitting: Internal Medicine

## 2016-02-07 ENCOUNTER — Encounter: Payer: Self-pay | Admitting: Internal Medicine

## 2016-02-07 VITALS — BP 110/64 | HR 62 | Temp 98.2°F | Resp 18 | Ht 75.5 in | Wt 223.0 lb

## 2016-02-07 DIAGNOSIS — E559 Vitamin D deficiency, unspecified: Secondary | ICD-10-CM | POA: Diagnosis not present

## 2016-02-07 DIAGNOSIS — E1122 Type 2 diabetes mellitus with diabetic chronic kidney disease: Secondary | ICD-10-CM | POA: Diagnosis not present

## 2016-02-07 DIAGNOSIS — E782 Mixed hyperlipidemia: Secondary | ICD-10-CM | POA: Diagnosis not present

## 2016-02-07 DIAGNOSIS — Z79899 Other long term (current) drug therapy: Secondary | ICD-10-CM

## 2016-02-07 DIAGNOSIS — N182 Chronic kidney disease, stage 2 (mild): Secondary | ICD-10-CM | POA: Diagnosis not present

## 2016-02-07 DIAGNOSIS — I10 Essential (primary) hypertension: Secondary | ICD-10-CM

## 2016-02-07 DIAGNOSIS — Z23 Encounter for immunization: Secondary | ICD-10-CM | POA: Diagnosis not present

## 2016-02-07 LAB — CBC WITH DIFFERENTIAL/PLATELET
BASOS ABS: 0 {cells}/uL (ref 0–200)
BASOS PCT: 0 %
EOS PCT: 5 %
Eosinophils Absolute: 230 cells/uL (ref 15–500)
HCT: 41.6 % (ref 38.5–50.0)
HEMOGLOBIN: 13.5 g/dL (ref 13.2–17.1)
LYMPHS ABS: 1518 {cells}/uL (ref 850–3900)
Lymphocytes Relative: 33 %
MCH: 28.1 pg (ref 27.0–33.0)
MCHC: 32.5 g/dL (ref 32.0–36.0)
MCV: 86.5 fL (ref 80.0–100.0)
MPV: 10.9 fL (ref 7.5–12.5)
Monocytes Absolute: 368 cells/uL (ref 200–950)
Monocytes Relative: 8 %
NEUTROS ABS: 2484 {cells}/uL (ref 1500–7800)
Neutrophils Relative %: 54 %
Platelets: 262 10*3/uL (ref 140–400)
RBC: 4.81 MIL/uL (ref 4.20–5.80)
RDW: 14.2 % (ref 11.0–15.0)
WBC: 4.6 10*3/uL (ref 3.8–10.8)

## 2016-02-07 LAB — BASIC METABOLIC PANEL WITH GFR
BUN: 23 mg/dL (ref 7–25)
CHLORIDE: 109 mmol/L (ref 98–110)
CO2: 23 mmol/L (ref 20–31)
Calcium: 9.6 mg/dL (ref 8.6–10.3)
Creat: 1.38 mg/dL — ABNORMAL HIGH (ref 0.70–1.25)
GFR, EST AFRICAN AMERICAN: 63 mL/min (ref >=60)
GFR, EST NON AFRICAN AMERICAN: 55 mL/min — AB (ref >=60)
Glucose, Bld: 107 mg/dL — ABNORMAL HIGH (ref 65–99)
POTASSIUM: 4.8 mmol/L (ref 3.5–5.3)
SODIUM: 139 mmol/L (ref 135–146)

## 2016-02-07 LAB — LIPID PANEL
CHOL/HDL RATIO: 3.4 ratio (ref <=5.0)
CHOLESTEROL: 106 mg/dL — AB (ref 125–200)
HDL: 31 mg/dL — AB (ref >=40)
LDL Cholesterol: 56 mg/dL (ref <130)
Triglycerides: 97 mg/dL (ref <150)
VLDL: 19 mg/dL (ref <30)

## 2016-02-07 LAB — HEPATIC FUNCTION PANEL
ALBUMIN: 4.2 g/dL (ref 3.6–5.1)
ALK PHOS: 52 U/L (ref 40–115)
ALT: 26 U/L (ref 9–46)
AST: 20 U/L (ref 10–35)
BILIRUBIN TOTAL: 0.4 mg/dL (ref 0.2–1.2)
Bilirubin, Direct: 0.1 mg/dL (ref <=0.2)
Indirect Bilirubin: 0.3 mg/dL (ref 0.2–1.2)
TOTAL PROTEIN: 6.7 g/dL (ref 6.1–8.1)

## 2016-02-07 LAB — TSH: TSH: 1.74 mIU/L (ref 0.40–4.50)

## 2016-02-07 LAB — HEMOGLOBIN A1C
HEMOGLOBIN A1C: 6.7 % — AB (ref <5.7)
MEAN PLASMA GLUCOSE: 146 mg/dL

## 2016-02-07 NOTE — Progress Notes (Signed)
Assessment and Plan:  Hypertension:  -Continue medication,  -monitor blood pressure at home.  -Continue DASH diet.   -Reminder to go to the ER if any CP, SOB, nausea, dizziness, severe HA, changes vision/speech, left arm numbness and tingling, and jaw pain.  Cholesterol: -Continue diet and exercise.  -Check cholesterol.   Diabetes: -consider stopping januvia if A1C continues to improve on labwork.   -Continue diet and exercise.  -Check A1C  Vitamin D Def: -check level -continue medications.   Continue diet and meds as discussed. Further disposition pending results of labs.  HPI 60 y.o. male  presents for 3 month follow up with hypertension, hyperlipidemia, prediabetes and vitamin D.   His blood pressure has been controlled at home, today their BP is BP: 110/64 mmHg.   He does workout. He denies chest pain, shortness of breath, dizziness.  He reports that he is working around the house and is also working in the yard.     He is on cholesterol medication and denies myalgias. His cholesterol is at goal. The cholesterol last visit was:   Lab Results  Component Value Date   CHOL 115* 09/30/2015   HDL 29* 09/30/2015   LDLCALC 47 09/30/2015   TRIG 194* 09/30/2015   CHOLHDL 4.0 09/30/2015     He has been working on diet and exercise for diabetes, and denies foot ulcerations, hyperglycemia, hypoglycemia , increased appetite, nausea, paresthesia of the feet, polydipsia, polyuria, visual disturbances, vomiting and weight loss. Last A1C in the office was:  Lab Results  Component Value Date   HGBA1C 6.5* 09/30/2015  He states that his blood sugars this morning was 108.  He is checking his sugars regularly.   Patient is on Vitamin D supplement.  Lab Results  Component Value Date   VD25OH 34 09/30/2015     He currently has no additional complaints today.  He notes things are going well at both work and home.    Current Medications:  Current Outpatient Prescriptions on File Prior  to Visit  Medication Sig Dispense Refill  . aspirin EC 81 MG tablet Take 81 mg by mouth at bedtime.    . B Complex-C (SUPER B COMPLEX PO) Take 1 tablet by mouth daily.    . Febuxostat (ULORIC) 80 MG TABS Take 1 tablet daily to prevent Gout 90 tablet 1  . fenofibrate micronized (LOFIBRA) 134 MG capsule Take 1 tablet daily to prevent Gout 90 capsule 1  . metFORMIN (GLUCOPHAGE-XR) 500 MG 24 hr tablet Take 2 tablets (1,000 mg total) by mouth 2 (two) times daily. 360 tablet 3  . Omega-3 Fatty Acids (FISH OIL) 1000 MG CAPS Take 1,000 mg by mouth 2 (two) times daily.     . sitaGLIPtin (JANUVIA) 100 MG tablet Take 1 tablet daily for Diabetes 90 tablet 1  . Vitamin D, Cholecalciferol, 1000 UNITS TABS Take 1,000 mg by mouth 2 (two) times daily.      No current facility-administered medications on file prior to visit.    Medical History:  Past Medical History  Diagnosis Date  . Vitamin D deficiency   . Diabetes mellitus without complication (HCC)   . H/O cluster headache     Allergies:  Allergies  Allergen Reactions  . Levaquin [Levofloxacin]     GI Upset  . Allopurinol Rash  . Valtrex [Valacyclovir Hcl] Hives     Review of Systems:  Review of Systems  Constitutional: Negative for fever, chills and malaise/fatigue.  HENT: Negative for congestion, ear pain  and sore throat.   Respiratory: Negative for cough, shortness of breath and wheezing.   Cardiovascular: Negative for chest pain, palpitations and leg swelling.  Gastrointestinal: Negative for heartburn, abdominal pain, diarrhea, constipation, blood in stool and melena.  Genitourinary: Negative.   Skin: Negative.   Neurological: Negative for dizziness, sensory change, loss of consciousness and headaches.  Psychiatric/Behavioral: Negative for depression. The patient is not nervous/anxious and does not have insomnia.     Family history- Review and unchanged  Social history- Review and unchanged  Physical Exam: BP 110/64 mmHg   Pulse 62  Temp(Src) 98.2 F (36.8 C) (Temporal)  Resp 18  Ht 6' 3.5" (1.918 m)  Wt 223 lb (101.152 kg)  BMI 27.50 kg/m2 Wt Readings from Last 3 Encounters:  02/07/16 223 lb (101.152 kg)  09/30/15 219 lb 6.4 oz (99.519 kg)  06/18/15 219 lb (99.338 kg)    General Appearance: Well nourished well developed, in no apparent distress. Eyes: PERRLA, EOMs, conjunctiva no swelling or erythema ENT/Mouth: Ear canals normal without obstruction, swelling, erythma, discharge.  TMs normal bilaterally.  Oropharynx moist, clear, without exudate, or postoropharyngeal swelling. Neck: Supple, thyroid normal,no cervical adenopathy  Respiratory: Respiratory effort normal, Breath sounds clear A&P without rhonchi, wheeze, or rale.  No retractions, no accessory usage. Cardio: RRR with no MRGs. Brisk peripheral pulses without edema.  Abdomen: Soft, + BS,  Non tender, no guarding, rebound, hernias, masses. Musculoskeletal: Full ROM, 5/5 strength, Normal gait Skin: Warm, dry without rashes, lesions, ecchymosis.  Neuro: Awake and oriented X 3, Cranial nerves intact. Normal muscle tone, no cerebellar symptoms. Psych: Normal affect, Insight and Judgment appropriate.    Terri Piedraourtney Forcucci, PA-C 9:27 AM [facility]

## 2016-04-08 ENCOUNTER — Other Ambulatory Visit: Payer: Self-pay | Admitting: Internal Medicine

## 2016-04-08 DIAGNOSIS — E1122 Type 2 diabetes mellitus with diabetic chronic kidney disease: Secondary | ICD-10-CM

## 2016-04-08 DIAGNOSIS — N182 Chronic kidney disease, stage 2 (mild): Secondary | ICD-10-CM

## 2016-04-08 DIAGNOSIS — M1000 Idiopathic gout, unspecified site: Secondary | ICD-10-CM

## 2016-04-08 DIAGNOSIS — E782 Mixed hyperlipidemia: Secondary | ICD-10-CM

## 2016-04-13 ENCOUNTER — Ambulatory Visit: Payer: Self-pay | Admitting: Internal Medicine

## 2016-05-13 ENCOUNTER — Encounter: Payer: Self-pay | Admitting: Internal Medicine

## 2016-05-13 ENCOUNTER — Ambulatory Visit (INDEPENDENT_AMBULATORY_CARE_PROVIDER_SITE_OTHER): Payer: BLUE CROSS/BLUE SHIELD | Admitting: Internal Medicine

## 2016-05-13 VITALS — BP 116/76 | HR 72 | Temp 97.3°F | Resp 16 | Ht 75.5 in | Wt 222.4 lb

## 2016-05-13 DIAGNOSIS — E782 Mixed hyperlipidemia: Secondary | ICD-10-CM

## 2016-05-13 DIAGNOSIS — J302 Other seasonal allergic rhinitis: Secondary | ICD-10-CM

## 2016-05-13 DIAGNOSIS — Z79899 Other long term (current) drug therapy: Secondary | ICD-10-CM | POA: Diagnosis not present

## 2016-05-13 DIAGNOSIS — I10 Essential (primary) hypertension: Secondary | ICD-10-CM

## 2016-05-13 DIAGNOSIS — E1122 Type 2 diabetes mellitus with diabetic chronic kidney disease: Secondary | ICD-10-CM | POA: Diagnosis not present

## 2016-05-13 DIAGNOSIS — N182 Chronic kidney disease, stage 2 (mild): Secondary | ICD-10-CM

## 2016-05-13 DIAGNOSIS — M1000 Idiopathic gout, unspecified site: Secondary | ICD-10-CM

## 2016-05-13 DIAGNOSIS — E559 Vitamin D deficiency, unspecified: Secondary | ICD-10-CM | POA: Diagnosis not present

## 2016-05-13 LAB — CBC WITH DIFFERENTIAL/PLATELET
BASOS ABS: 0 {cells}/uL (ref 0–200)
BASOS PCT: 0 %
EOS ABS: 355 {cells}/uL (ref 15–500)
Eosinophils Relative: 5 %
HEMATOCRIT: 43.3 % (ref 38.5–50.0)
Hemoglobin: 14.2 g/dL (ref 13.2–17.1)
LYMPHS PCT: 34 %
Lymphs Abs: 2414 cells/uL (ref 850–3900)
MCH: 28.5 pg (ref 27.0–33.0)
MCHC: 32.8 g/dL (ref 32.0–36.0)
MCV: 86.9 fL (ref 80.0–100.0)
MONO ABS: 497 {cells}/uL (ref 200–950)
MPV: 11.2 fL (ref 7.5–12.5)
Monocytes Relative: 7 %
Neutro Abs: 3834 cells/uL (ref 1500–7800)
Neutrophils Relative %: 54 %
Platelets: 329 10*3/uL (ref 140–400)
RBC: 4.98 MIL/uL (ref 4.20–5.80)
RDW: 14.2 % (ref 11.0–15.0)
WBC: 7.1 10*3/uL (ref 3.8–10.8)

## 2016-05-13 LAB — HEPATIC FUNCTION PANEL
ALBUMIN: 5 g/dL (ref 3.6–5.1)
ALK PHOS: 52 U/L (ref 40–115)
ALT: 35 U/L (ref 9–46)
AST: 28 U/L (ref 10–35)
Bilirubin, Direct: 0.1 mg/dL (ref <=0.2)
Indirect Bilirubin: 0.3 mg/dL (ref 0.2–1.2)
TOTAL PROTEIN: 7.7 g/dL (ref 6.1–8.1)
Total Bilirubin: 0.4 mg/dL (ref 0.2–1.2)

## 2016-05-13 LAB — BASIC METABOLIC PANEL WITH GFR
BUN: 19 mg/dL (ref 7–25)
CHLORIDE: 103 mmol/L (ref 98–110)
CO2: 25 mmol/L (ref 20–31)
CREATININE: 1.26 mg/dL — AB (ref 0.70–1.25)
Calcium: 10.2 mg/dL (ref 8.6–10.3)
GFR, Est African American: 70 mL/min (ref >=60)
GFR, Est Non African American: 61 mL/min (ref >=60)
GLUCOSE: 95 mg/dL (ref 65–99)
POTASSIUM: 4.6 mmol/L (ref 3.5–5.3)
Sodium: 137 mmol/L (ref 135–146)

## 2016-05-13 LAB — MAGNESIUM: Magnesium: 1.9 mg/dL (ref 1.5–2.5)

## 2016-05-13 LAB — LIPID PANEL
Cholesterol: 122 mg/dL — ABNORMAL LOW (ref 125–200)
HDL: 31 mg/dL — ABNORMAL LOW (ref >=40)
LDL CALC: 63 mg/dL (ref <130)
Total CHOL/HDL Ratio: 3.9 Ratio (ref <=5.0)
Triglycerides: 142 mg/dL (ref <150)
VLDL: 28 mg/dL (ref <30)

## 2016-05-13 LAB — TSH: TSH: 1.79 m[IU]/L (ref 0.40–4.50)

## 2016-05-13 LAB — URIC ACID: URIC ACID, SERUM: 3.7 mg/dL — AB (ref 4.0–8.0)

## 2016-05-13 MED ORDER — MONTELUKAST SODIUM 10 MG PO TABS: ORAL_TABLET | ORAL | 1 refills | Status: DC

## 2016-05-13 NOTE — Patient Instructions (Signed)

## 2016-05-13 NOTE — Progress Notes (Signed)
[facility]                       Unk Pinto, M.D.        Uvaldo Bristle. [name], P.A.-C       Starlyn Skeans, P.A.-C  [facility]                [address] [place], [place] [address] Telephone [phone] Telefax [phone] ______________________________________________________________________     This very nice 60 y.o.MWM presents for 6 month follow up with Hypertension, Hyperlipidemia, Pre-Diabetes and Vitamin D Deficiency.      Patient is treated for HTN circa 2010 & BP has been controlled at home. Today's BP is  116/76. Patient has had no complaints of any cardiac type chest pain, palpitations, dyspnea/orthopnea/PND, dizziness, claudication, or dependent edema.     Hyperlipidemia is controlled with diet & meds. Patient denies myalgias or other med SE's. Last Lipids were at goal: Lab Results  Component Value Date   CHOL 106 (L) 02/07/2016   HDL 31 (L) 02/07/2016   LDLCALC 56 02/07/2016   TRIG 97 02/07/2016   CHOLHDL 3.4 02/07/2016      Also, the patient has history of T2_NIDDM predating circa 2013 with Metformin initiated in 10/2012 and has had no symptoms of reactive hypoglycemia, diabetic polys, paresthesias or visual blurring. He alleges CBG's range 110-120 mg%.  Patient also has CKD 2 (GFR 63) . Patient also has gout controlled on his uloric.  Last A1c was not at goal: Lab Results  Component Value Date   HGBA1C 6.7 (H) 02/07/2016      Further, the patient also has history of Vitamin D Deficiency with level "34" in 2012  and supplements vitamin D without any suspected side-effects. Last vitamin D was still very low:  Lab Results  Component Value Date   VD25OH 34 09/30/2015   Current Outpatient Prescriptions on File Prior to Visit  Medication Sig  . aspirin EC 81 MG tablet Take 81 mg by mouth at bedtime.  . B Complex-C (SUPER B COMPLEX PO) Take 1 tablet by mouth daily.  . fenofibrate  micronized (LOFIBRA) 134 MG capsule TAKE 1 CAPSULE DAILY TO PREVENT GOUT  . glucose blood test strip 1 each by Other route as needed for other. Use as instructed  . glucose monitoring kit (FREESTYLE) monitoring kit 1 each by Does not apply route as needed for other.  Marland Kitchen JANUVIA 100 MG tablet TAKE 1 TABLET DAILY FOR DIABETES  . Lancets (FREESTYLE) lancets 1 each by Other route as needed for other. Use as instructed  . metFORMIN (GLUCOPHAGE-XR) 500 MG 24 hr tablet Take 2 tablets (1,000 mg total) by mouth 2 (two) times daily.  . Omega-3 Fatty Acids (FISH OIL) 1000 MG CAPS Take 1,000 mg by mouth 2 (two) times daily.   Marland Kitchen ULORIC 80 MG TABS TAKE 1 TABLET DAILY TO PREVENT GOUT  . Vitamin D, Cholecalciferol, 1000 UNITS TABS Take 1,000 mg by mouth 2 (two) times daily.    No current facility-administered medications on file prior to visit.    Allergies  Allergen Reactions  . Levaquin [Levofloxacin]     GI Upset  . Allopurinol Rash  . Valtrex [Valacyclovir Hcl] Hives   PMHx:   Past Medical History:  Diagnosis Date  . Diabetes mellitus without complication ([place])   . H/O cluster  headache   . Vitamin D deficiency    Immunization History  Administered Date(s) Administered  . Influenza Split 05/15/2013, 07/17/2014, 06/18/2015  . PPD Test 07/17/2014, 09/30/2015  . Pneumococcal Polysaccharide-23 02/07/2016  . Tdap 09/30/2015   No past surgical history on file. FHx:    Reviewed / unchanged  SHx:    Reviewed / unchanged  Systems Review:  Constitutional: Denies fever, chills, wt changes, headaches, insomnia, fatigue, night sweats, change in appetite. Eyes: Denies redness, blurred vision, diplopia, discharge, itchy, watery eyes.  ENT: Denies discharge, congestion, post nasal drip, epistaxis, sore throat, earache, hearing loss, dental pain, tinnitus, vertigo, sinus pain, snoring.  CV: Denies chest pain, palpitations, irregular heartbeat, syncope, dyspnea, diaphoresis, orthopnea, PND, claudication or  edema. Respiratory: denies cough, dyspnea, DOE, pleurisy, hoarseness, laryngitis, wheezing.  Gastrointestinal: Denies dysphagia, odynophagia, heartburn, reflux, water brash, abdominal pain or cramps, nausea, vomiting, bloating, diarrhea, constipation, hematemesis, melena, hematochezia  or hemorrhoids. Genitourinary: Denies dysuria, frequency, urgency, nocturia, hesitancy, discharge, hematuria or flank pain. Musculoskeletal: Denies arthralgias, myalgias, stiffness, jt. swelling, pain, limping or strain/sprain.  Skin: Denies pruritus, rash, hives, warts, acne, eczema or change in skin lesion(s). Neuro: No weakness, tremor, incoordination, spasms, paresthesia or pain. Psychiatric: Denies confusion, memory loss or sensory loss. Endo: Denies change in weight, skin or hair change.  Heme/Lymph: No excessive bleeding, bruising or enlarged lymph nodes.  Physical Exam BP 116/76   Pulse 72   Temp 97.3 F (36.3 C)   Resp 16   Ht 6' 3.5" (1.918 m)   Wt 222 lb 6.4 oz (100.9 kg)   BMI 27.43 kg/m   Appears well nourished and in no distress.  Eyes: PERRLA, EOMs, conjunctiva no swelling or erythema. Sinuses: No frontal/maxillary tenderness ENT/Mouth: EAC's clear, TM's nl w/o erythema, bulging. Nares clear w/o erythema, swelling, exudates. Oropharynx clear without erythema or exudates. Oral hygiene is good. Tongue normal, non obstructing. Hearing intact.  Neck: Supple. Thyroid nl. Car 2+/2+ without bruits, nodes or JVD. Chest: Respirations nl with BS clear & equal w/o rales, rhonchi, wheezing or stridor.  Cor: Heart sounds normal w/ regular rate and rhythm without sig. murmurs, gallops, clicks, or rubs. Peripheral pulses normal and equal  without edema.  Abdomen: Soft & bowel sounds normal. Non-tender w/o guarding, rebound, hernias, masses, or organomegaly.  Lymphatics: Unremarkable.  Musculoskeletal: Full ROM all peripheral extremities, joint stability, 5/5 strength, and normal gait.  Skin: Warm, dry  without exposed rashes, lesions or ecchymosis apparent.  Neuro: Cranial nerves intact, reflexes equal bilaterally. Sensory-motor testing grossly intact. Tendon reflexes grossly intact.  Pysch: Alert & oriented x 3.  Insight and judgement nl & appropriate. No ideations.  Assessment and Plan:  1. Essential hypertension  - Continue medication, monitor blood pressure at home. Continue DASH diet. Reminder to go to the ER if any CP, SOB, nausea, dizziness, severe HA, changes vision/speech, left arm numbness and tingling and jaw pain. - TSH  2. Mixed hyperlipidemia  - Continue diet/meds, exercise,& lifestyle modifications. Continue monitor periodic cholesterol/liver & renal functions  - Lipid panel - TSH  3. Type 2 diabetes mellitus with stage 2 chronic kidney disease, without long-term current use of insulin (HCC)  - Continue diet, exercise, lifestyle modifications. Monitor appropriate labs. - Hemoglobin A1c - Insulin, random  4. Vitamin D deficiency  - Continue supplementation. - VITAMIN D 25 Hydroxy   5. Idiopathic gout  - Uric acid  6. Medication management  - CBC with Differential/Platelet - BASIC METABOLIC PANEL WITH GFR - Hepatic function panel -  Magnesium     Recommended regular exercise, BP monitoring, weight control, and discussed med and SE's. Recommended labs to assess and monitor clinical status. Further disposition pending results of labs. Over 30 minutes of exam, counseling, chart review was performed

## 2016-05-14 LAB — INSULIN, RANDOM: Insulin: 6 u[IU]/mL (ref 2.0–19.6)

## 2016-05-14 LAB — HEMOGLOBIN A1C
HEMOGLOBIN A1C: 6.5 % — AB (ref <5.7)
MEAN PLASMA GLUCOSE: 140 mg/dL

## 2016-05-14 LAB — VITAMIN D 25 HYDROXY (VIT D DEFICIENCY, FRACTURES): Vit D, 25-Hydroxy: 72 ng/mL (ref 30–100)

## 2016-07-08 ENCOUNTER — Other Ambulatory Visit: Payer: Self-pay | Admitting: *Deleted

## 2016-07-08 DIAGNOSIS — E1122 Type 2 diabetes mellitus with diabetic chronic kidney disease: Secondary | ICD-10-CM

## 2016-07-08 DIAGNOSIS — N182 Chronic kidney disease, stage 2 (mild): Principal | ICD-10-CM

## 2016-07-08 MED ORDER — SITAGLIPTIN PHOSPHATE 100 MG PO TABS: ORAL_TABLET | ORAL | 0 refills | Status: DC

## 2016-07-12 ENCOUNTER — Other Ambulatory Visit: Payer: Self-pay | Admitting: Internal Medicine

## 2016-07-12 DIAGNOSIS — M1000 Idiopathic gout, unspecified site: Secondary | ICD-10-CM

## 2016-07-12 DIAGNOSIS — E782 Mixed hyperlipidemia: Secondary | ICD-10-CM

## 2016-07-17 ENCOUNTER — Other Ambulatory Visit: Payer: Self-pay | Admitting: *Deleted

## 2016-07-17 DIAGNOSIS — M1000 Idiopathic gout, unspecified site: Secondary | ICD-10-CM

## 2016-07-17 DIAGNOSIS — E782 Mixed hyperlipidemia: Secondary | ICD-10-CM

## 2016-07-17 MED ORDER — FENOFIBRATE MICRONIZED 134 MG PO CAPS: ORAL_CAPSULE | ORAL | 1 refills | Status: DC

## 2016-07-17 MED ORDER — FEBUXOSTAT 80 MG PO TABS: ORAL_TABLET | ORAL | 1 refills | Status: DC

## 2016-08-19 ENCOUNTER — Ambulatory Visit: Payer: Self-pay | Admitting: Physician Assistant

## 2016-09-08 ENCOUNTER — Encounter: Payer: Self-pay | Admitting: Physician Assistant

## 2016-09-08 ENCOUNTER — Ambulatory Visit (INDEPENDENT_AMBULATORY_CARE_PROVIDER_SITE_OTHER): Payer: BLUE CROSS/BLUE SHIELD | Admitting: Physician Assistant

## 2016-09-08 VITALS — BP 120/80 | HR 75 | Temp 97.3°F | Resp 16 | Ht 75.5 in | Wt 225.0 lb

## 2016-09-08 DIAGNOSIS — I10 Essential (primary) hypertension: Secondary | ICD-10-CM

## 2016-09-08 DIAGNOSIS — M109 Gout, unspecified: Secondary | ICD-10-CM | POA: Diagnosis not present

## 2016-09-08 DIAGNOSIS — Z23 Encounter for immunization: Secondary | ICD-10-CM | POA: Diagnosis not present

## 2016-09-08 DIAGNOSIS — N182 Chronic kidney disease, stage 2 (mild): Secondary | ICD-10-CM | POA: Diagnosis not present

## 2016-09-08 DIAGNOSIS — E559 Vitamin D deficiency, unspecified: Secondary | ICD-10-CM | POA: Diagnosis not present

## 2016-09-08 DIAGNOSIS — Z79899 Other long term (current) drug therapy: Secondary | ICD-10-CM | POA: Diagnosis not present

## 2016-09-08 DIAGNOSIS — E1122 Type 2 diabetes mellitus with diabetic chronic kidney disease: Secondary | ICD-10-CM

## 2016-09-08 DIAGNOSIS — E782 Mixed hyperlipidemia: Secondary | ICD-10-CM | POA: Diagnosis not present

## 2016-09-08 DIAGNOSIS — J302 Other seasonal allergic rhinitis: Secondary | ICD-10-CM | POA: Diagnosis not present

## 2016-09-08 DIAGNOSIS — K760 Fatty (change of) liver, not elsewhere classified: Secondary | ICD-10-CM

## 2016-09-08 LAB — HEMOGLOBIN A1C
Hgb A1c MFr Bld: 6.7 % — ABNORMAL HIGH (ref <5.7)
MEAN PLASMA GLUCOSE: 146 mg/dL

## 2016-09-08 LAB — CBC WITH DIFFERENTIAL/PLATELET
Basophils Absolute: 0 cells/uL (ref 0–200)
Basophils Relative: 0 %
EOS PCT: 3 %
Eosinophils Absolute: 270 cells/uL (ref 15–500)
HCT: 43.7 % (ref 38.5–50.0)
HEMOGLOBIN: 14.3 g/dL (ref 13.2–17.1)
LYMPHS ABS: 2160 {cells}/uL (ref 850–3900)
Lymphocytes Relative: 24 %
MCH: 28.3 pg (ref 27.0–33.0)
MCHC: 32.7 g/dL (ref 32.0–36.0)
MCV: 86.4 fL (ref 80.0–100.0)
MPV: 11 fL (ref 7.5–12.5)
Monocytes Absolute: 540 cells/uL (ref 200–950)
Monocytes Relative: 6 %
NEUTROS ABS: 6030 {cells}/uL (ref 1500–7800)
Neutrophils Relative %: 67 %
Platelets: 312 10*3/uL (ref 140–400)
RBC: 5.06 MIL/uL (ref 4.20–5.80)
RDW: 14.1 % (ref 11.0–15.0)
WBC: 9 10*3/uL (ref 3.8–10.8)

## 2016-09-08 LAB — TSH: TSH: 1.75 m[IU]/L (ref 0.40–4.50)

## 2016-09-08 MED ORDER — MONTELUKAST SODIUM 10 MG PO TABS: ORAL_TABLET | ORAL | 1 refills | Status: DC

## 2016-09-08 NOTE — Progress Notes (Signed)
Assessment and Plan:  Flu vaccine need -     Flu vaccine 6-60mopreservative free IM  Essential hypertension - continue medications, DASH diet, exercise and monitor at home. Call if greater than 130/80.  -     CBC with Differential/Platelet -     BASIC METABOLIC PANEL WITH GFR -     TSH  Fatty liver disease, nonalcoholic Weight loss advised, avoid alcohol/tylenol, will monitor LFTs -     Hepatic function panel  Type 2 diabetes mellitus with stage 2 chronic kidney disease, without long-term current use of insulin (HCC) -     Hemoglobin A1c Discussed general issues about diabetes pathophysiology and management., Educational material distributed., Suggested low cholesterol diet., Encouraged aerobic exercise., Discussed foot care., Reminded to get yearly retinal exam.  Mixed hyperlipidemia -continue medications, check lipids, decrease fatty foods, increase activity.  -     Lipid panel  Medication management -     Magnesium  Vitamin D deficiency -     VITAMIN D 25 Hydroxy (Vit-D Deficiency, Fractures)  Seasonal allergic rhinitis, unspecified chronicity, unspecified trigger -     montelukast (SINGULAIR) 10 MG tablet; Take 1 tablet daily for Allergies  Gout, unspecified cause, unspecified chronicity, unspecified site Gout- recheck Uric acid as needed, Diet discussed, continue medications. -     Uric acid   Continue diet and meds as discussed. Further disposition pending results of labs. Discussed med's effects and SE's.    HPI 61y.o. male  presents for 3 month follow up HTN, Chol, DM with CKD, and vitamin D def.   His blood pressure has been controlled at home, today his BP is BP: 120/80.  He does not workout during cold weather but will start more with the nicer temps. He denies chest pain, shortness of breath, dizziness. He has been taking dayquil and nyquil for cold, but is improving, has increased sugars some, still has cough. No CP, SOB, wheezing, fever, chlils.   He is on  cholesterol medication, fenofibrate/lipitor and denies myalgias. His cholesterol is at goal. The cholesterol was:  05/13/2016: Cholesterol 122; HDL 31; LDL Cholesterol 63; Triglycerides 142  He has been working on diet and exercise for diabetes with diabetic chronic kidney disease, he is on bASA, he is not on ACE/ARB due to hypotension, MF 20069mand januvia 5060maily, this AM was 120, no hypoglycemia, and denies  paresthesia of the feet, polydipsia, polyuria and visual disturbances. Last A1C was: 05/13/2016: Hgb A1c MFr Bld 6.5  Lab Results  Component Value Date   GFRNONAA 61 05/13/2016   Patient is on Vitamin D supplement. 05/13/2016: Vit D, 25-Hydroxy 72 He sees Dr. HawTrudie Reedr gout and is on uloric, no flares.  BMI is Body mass index is 27.75 kg/m., he is working on diet and exercise. Wt Readings from Last 3 Encounters:  09/08/16 225 lb (102.1 kg)  05/13/16 222 lb 6.4 oz (100.9 kg)  02/07/16 223 lb (101.2 kg)    Current Medications:  Current Outpatient Prescriptions on File Prior to Visit  Medication Sig Dispense Refill  . aspirin EC 81 MG tablet Take 81 mg by mouth at bedtime.    . B Complex-C (SUPER B COMPLEX PO) Take 1 tablet by mouth daily.    . Febuxostat (ULORIC) 80 MG TABS TAKE 1 TABLET DAILY TO PREVENT GOUT 90 tablet 1  . fenofibrate micronized (LOFIBRA) 134 MG capsule TAKE 1 CAPSULE DAILY TO PREVENT GOUT 90 capsule 1  . glucose blood test strip 1 each  by Other route as needed for other. Use as instructed    . glucose monitoring kit (FREESTYLE) monitoring kit 1 each by Does not apply route as needed for other.    . Lancets (FREESTYLE) lancets 1 each by Other route as needed for other. Use as instructed    . metFORMIN (GLUCOPHAGE-XR) 500 MG 24 hr tablet Take 2 tablets (1,000 mg total) by mouth 2 (two) times daily. 360 tablet 3  . Omega-3 Fatty Acids (FISH OIL) 1000 MG CAPS Take 1,000 mg by mouth 2 (two) times daily.     . sitaGLIPtin (JANUVIA) 100 MG tablet TAKE 1 TABLET DAILY FOR  DIABETES 90 tablet 0  . Vitamin D, Cholecalciferol, 1000 UNITS TABS Take 1,000 mg by mouth 2 (two) times daily.      No current facility-administered medications on file prior to visit.    Medical History:  Past Medical History:  Diagnosis Date  . Diabetes mellitus without complication ([place])   . H/O cluster headache   . Vitamin D deficiency    Allergies:  Allergies  Allergen Reactions  . Levaquin [Levofloxacin]     GI Upset  . Allopurinol Rash  . Valtrex [Valacyclovir Hcl] Hives     Review of Systems:  Review of Systems  Constitutional: Negative.   HENT: Negative.   Eyes: Negative.   Respiratory: Negative.   Cardiovascular: Negative.   Gastrointestinal: Negative.   Genitourinary: Negative.   Musculoskeletal: Negative.   Skin: Negative.   Neurological: Negative.   Endo/Heme/Allergies: Negative.   Psychiatric/Behavioral: Negative.     Family history- Review and unchanged Social history- Review and unchanged Physical Exam: BP 120/80   Pulse 75   Temp 97.3 F (36.3 C)   Resp 16   Ht 6' 3.5" (1.918 m)   Wt 225 lb (102.1 kg)   SpO2 98%   BMI 27.75 kg/m  Wt Readings from Last 3 Encounters:  09/08/16 225 lb (102.1 kg)  05/13/16 222 lb 6.4 oz (100.9 kg)  02/07/16 223 lb (101.2 kg)   General Appearance: Well nourished, in no apparent distress. Eyes: PERRLA, EOMs, conjunctiva no swelling or erythema Sinuses: No Frontal/maxillary tenderness ENT/Mouth: Ext aud canals clear, TMs without erythema, bulging. No erythema, swelling, or exudate on post pharynx.  Tonsils not swollen or erythematous. Hearing normal.  Neck: Supple, thyroid normal.  Respiratory: Respiratory effort normal, BS equal bilaterally without rales, rhonchi, wheezing or stridor.  Cardio: RRR with no MRGs. Brisk peripheral pulses without edema.  Abdomen: Soft, + BS.  Non tender, no guarding, rebound, hernias, masses. Lymphatics: Non tender without lymphadenopathy.  Musculoskeletal: Full ROM, 5/5  strength, Normal gait Skin: Warm, dry without rashes, lesions, ecchymosis.  Neuro: Cranial nerves intact. No cerebellar symptoms.  Psych: Awake and oriented X 3, normal affect, Insight and Judgment appropriate.    Vicie Mutters, PA-C 10:58 AM [facility]

## 2016-09-08 NOTE — Patient Instructions (Signed)
HOW TO TREAT VIRAL COUGH AND COLD SYMPTOMS:  -Symptoms usually last at least 1 week with the worst symptoms being around day 4.  - colds usually start with a sore throat and end with a cough, and the cough can take 2 weeks to get better.  -No antibiotics are needed for colds, flu, sore throats, cough, bronchitis UNLESS symptoms are longer than 7 days OR if you are getting better then get drastically worse.  -There are a lot of combination medications (Dayquil, Nyquil, Vicks 44, tyelnol cold and sinus, ETC). Please look at the ingredients on the back so that you are treating the correct symptoms and not doubling up on medications/ingredients.    Medicines you can use  Nasal congestion  - pseudoephedrine (Sudafed)- behind the counter, do not use if you have high blood pressure, medicine that have -D in them.  - phenylephrine (Sudafed PE) -Dextormethorphan + chlorpheniramine (Coridcidin HBP)- okay if you have high blood pressure -Oxymetazoline (Afrin) nasal spray- LIMIT to 3 days -Saline nasal spray -Neti pot (used distilled or bottled water)  Ear pain/congestion  -pseudoephedrine (sudafed) - Nasonex/flonase nasal spray  Fever  -Acetaminophen (Tyelnol) -Ibuprofen (Advil, motrin, aleve)  Sore Throat  -Acetaminophen (Tyelnol) -Ibuprofen (Advil, motrin, aleve) -Drink a lot of water -Gargle with salt water - Rest your voice (don't talk) -Throat sprays -Cough drops  Body Aches  -Acetaminophen (Tyelnol) -Ibuprofen (Advil, motrin, aleve)  Headache  -Acetaminophen (Tyelnol) -Ibuprofen (Advil, motrin, aleve) - Exedrin, Exedrin Migraine  Allergy symptoms (cough, sneeze, runny nose, itchy eyes) -Claritin or loratadine cheapest but likely the weakest  -Zyrtec or certizine at night because it can make you sleepy -The strongest is allegra or fexafinadine  Cheapest at [employer], [employer], [employer]  Cough  -Dextromethorphan (Delsym)- medicine that has DM in it -Guafenesin  (Mucinex/Robitussin) - cough drops - drink lots of water  Chest Congestion  -Guafenesin (Mucinex/Robitussin)  Red Itchy Eyes  - Naphcon-A  Upset Stomach  - Bland diet (nothing spicy, greasy, fried, and high acid foods like tomatoes, oranges, berries) -OKAY- cereal, bread, soup, crackers, rice -Eat smaller more frequent meals -reduce caffeine, no alcohol -Loperamide (Imodium-AD) if diarrhea -Prevacid for heart burn  General health when sick  -Hydration -wash your hands frequently -keep surfaces clean -change pillow cases and sheets often -Get fresh air but do not exercise strenuously -Vitamin D, double up on it - Vitamin C -Zinc      Bad carbs also include fruit juice, alcohol, and sweet tea. These are empty calories that do not signal to your brain that you are full.   Please remember the good carbs are still carbs which convert into sugar. So please measure them out no more than 1/2-1 cup of rice, oatmeal, pasta, and beans  Veggies are however free foods! Pile them on.   Not all fruit is created equal. Please see the list below, the fruit at the bottom is higher in sugars than the fruit at the top. Please avoid all dried fruits.     

## 2016-09-09 LAB — BASIC METABOLIC PANEL WITH GFR
BUN: 19 mg/dL (ref 7–25)
CALCIUM: 9.6 mg/dL (ref 8.6–10.3)
CO2: 19 mmol/L — AB (ref 20–31)
CREATININE: 1.25 mg/dL (ref 0.70–1.25)
Chloride: 107 mmol/L (ref 98–110)
GFR, Est African American: 71 mL/min (ref >=60)
GFR, Est Non African American: 61 mL/min (ref >=60)
GLUCOSE: 121 mg/dL — AB (ref 65–99)
Potassium: 4.5 mmol/L (ref 3.5–5.3)
Sodium: 140 mmol/L (ref 135–146)

## 2016-09-09 LAB — LIPID PANEL
CHOLESTEROL: 122 mg/dL (ref <200)
HDL: 29 mg/dL — ABNORMAL LOW (ref >40)
LDL Cholesterol: 60 mg/dL (ref <100)
Total CHOL/HDL Ratio: 4.2 Ratio (ref <5.0)
Triglycerides: 166 mg/dL — ABNORMAL HIGH (ref <150)
VLDL: 33 mg/dL — ABNORMAL HIGH (ref <30)

## 2016-09-09 LAB — HEPATIC FUNCTION PANEL
ALBUMIN: 4.4 g/dL (ref 3.6–5.1)
ALT: 28 U/L (ref 9–46)
AST: 26 U/L (ref 10–35)
Alkaline Phosphatase: 50 U/L (ref 40–115)
Bilirubin, Direct: 0.1 mg/dL (ref <=0.2)
Indirect Bilirubin: 0.3 mg/dL (ref 0.2–1.2)
TOTAL PROTEIN: 7 g/dL (ref 6.1–8.1)
Total Bilirubin: 0.4 mg/dL (ref 0.2–1.2)

## 2016-09-09 LAB — MAGNESIUM: MAGNESIUM: 1.9 mg/dL (ref 1.5–2.5)

## 2016-09-09 LAB — VITAMIN D 25 HYDROXY (VIT D DEFICIENCY, FRACTURES): Vit D, 25-Hydroxy: 43 ng/mL (ref 30–100)

## 2016-09-09 LAB — URIC ACID: URIC ACID, SERUM: 3.1 mg/dL — AB (ref 4.0–8.0)

## 2016-09-09 NOTE — Progress Notes (Signed)
Pt aware of lab results & voiced understanding of those results.

## 2016-09-21 ENCOUNTER — Other Ambulatory Visit: Payer: Self-pay | Admitting: Internal Medicine

## 2016-09-21 DIAGNOSIS — N182 Chronic kidney disease, stage 2 (mild): Secondary | ICD-10-CM

## 2016-09-21 DIAGNOSIS — E1122 Type 2 diabetes mellitus with diabetic chronic kidney disease: Secondary | ICD-10-CM

## 2016-09-21 DIAGNOSIS — E1121 Type 2 diabetes mellitus with diabetic nephropathy: Secondary | ICD-10-CM

## 2016-10-19 ENCOUNTER — Encounter: Payer: Self-pay | Admitting: Internal Medicine

## 2016-11-05 ENCOUNTER — Encounter: Payer: Self-pay | Admitting: Internal Medicine

## 2016-12-08 ENCOUNTER — Encounter: Payer: Self-pay | Admitting: Internal Medicine

## 2017-01-10 ENCOUNTER — Other Ambulatory Visit: Payer: Self-pay | Admitting: Internal Medicine

## 2017-01-10 DIAGNOSIS — N182 Chronic kidney disease, stage 2 (mild): Principal | ICD-10-CM

## 2017-01-10 DIAGNOSIS — E1122 Type 2 diabetes mellitus with diabetic chronic kidney disease: Secondary | ICD-10-CM

## 2017-01-17 ENCOUNTER — Other Ambulatory Visit: Payer: Self-pay | Admitting: Internal Medicine

## 2017-01-17 DIAGNOSIS — E1121 Type 2 diabetes mellitus with diabetic nephropathy: Secondary | ICD-10-CM

## 2017-01-17 DIAGNOSIS — E1122 Type 2 diabetes mellitus with diabetic chronic kidney disease: Secondary | ICD-10-CM

## 2017-01-17 DIAGNOSIS — N182 Chronic kidney disease, stage 2 (mild): Secondary | ICD-10-CM

## 2017-02-02 ENCOUNTER — Ambulatory Visit (INDEPENDENT_AMBULATORY_CARE_PROVIDER_SITE_OTHER): Payer: BLUE CROSS/BLUE SHIELD | Admitting: Internal Medicine

## 2017-02-02 ENCOUNTER — Encounter: Payer: Self-pay | Admitting: Internal Medicine

## 2017-02-02 VITALS — BP 130/70 | HR 40 | Temp 98.1°F | Resp 16 | Ht 75.5 in | Wt 219.0 lb

## 2017-02-02 DIAGNOSIS — Z0001 Encounter for general adult medical examination with abnormal findings: Secondary | ICD-10-CM

## 2017-02-02 DIAGNOSIS — I10 Essential (primary) hypertension: Secondary | ICD-10-CM

## 2017-02-02 DIAGNOSIS — Z111 Encounter for screening for respiratory tuberculosis: Secondary | ICD-10-CM

## 2017-02-02 DIAGNOSIS — Z125 Encounter for screening for malignant neoplasm of prostate: Secondary | ICD-10-CM

## 2017-02-02 DIAGNOSIS — Z1212 Encounter for screening for malignant neoplasm of rectum: Secondary | ICD-10-CM

## 2017-02-02 DIAGNOSIS — E1122 Type 2 diabetes mellitus with diabetic chronic kidney disease: Secondary | ICD-10-CM

## 2017-02-02 DIAGNOSIS — Z0000 Encounter for general adult medical examination without abnormal findings: Secondary | ICD-10-CM | POA: Diagnosis not present

## 2017-02-02 DIAGNOSIS — Z79899 Other long term (current) drug therapy: Secondary | ICD-10-CM | POA: Diagnosis not present

## 2017-02-02 DIAGNOSIS — E782 Mixed hyperlipidemia: Secondary | ICD-10-CM

## 2017-02-02 DIAGNOSIS — N182 Chronic kidney disease, stage 2 (mild): Secondary | ICD-10-CM

## 2017-02-02 DIAGNOSIS — M1000 Idiopathic gout, unspecified site: Secondary | ICD-10-CM

## 2017-02-02 DIAGNOSIS — E559 Vitamin D deficiency, unspecified: Secondary | ICD-10-CM

## 2017-02-02 DIAGNOSIS — I498 Other specified cardiac arrhythmias: Secondary | ICD-10-CM

## 2017-02-02 DIAGNOSIS — R5383 Other fatigue: Secondary | ICD-10-CM

## 2017-02-02 DIAGNOSIS — I499 Cardiac arrhythmia, unspecified: Secondary | ICD-10-CM

## 2017-02-02 DIAGNOSIS — Z136 Encounter for screening for cardiovascular disorders: Secondary | ICD-10-CM | POA: Diagnosis not present

## 2017-02-02 LAB — CBC WITH DIFFERENTIAL/PLATELET
BASOS ABS: 0 {cells}/uL (ref 0–200)
Basophils Relative: 0 %
EOS ABS: 207 {cells}/uL (ref 15–500)
Eosinophils Relative: 3 %
HCT: 44.5 % (ref 38.5–50.0)
Hemoglobin: 14.7 g/dL (ref 13.2–17.1)
LYMPHS PCT: 30 %
Lymphs Abs: 2070 cells/uL (ref 850–3900)
MCH: 28.4 pg (ref 27.0–33.0)
MCHC: 33 g/dL (ref 32.0–36.0)
MCV: 86.1 fL (ref 80.0–100.0)
MPV: 11.3 fL (ref 7.5–12.5)
Monocytes Absolute: 483 cells/uL (ref 200–950)
Monocytes Relative: 7 %
NEUTROS PCT: 60 %
Neutro Abs: 4140 cells/uL (ref 1500–7800)
PLATELETS: 299 10*3/uL (ref 140–400)
RBC: 5.17 MIL/uL (ref 4.20–5.80)
RDW: 13.9 % (ref 11.0–15.0)
WBC: 6.9 10*3/uL (ref 3.8–10.8)

## 2017-02-02 MED ORDER — ALLOPURINOL 300 MG PO TABS: ORAL_TABLET | ORAL | 3 refills | Status: DC

## 2017-02-02 MED ORDER — ALLOPURINOL 300 MG PO TABS: 300.0000 mg | ORAL_TABLET | Freq: Every day | ORAL | 2 refills | Status: DC

## 2017-02-02 NOTE — Patient Instructions (Signed)

## 2017-02-02 NOTE — Progress Notes (Signed)
[name]   Sean [name], M.D.      Sean [name]. Sean [name], P.A.-C [name]                [address] [name] [address] Telephone [phone] Telefax [phone] Annual  Screening/Preventative Visit  & Comprehensive Evaluation & Examination     This very nice 61 y.o. MWM presents for a Screening/Preventative Visit & comprehensive evaluation and management of multiple medical co-morbidities.  Patient has been followed for HTN, T2_NIDDM, Hyperlipidemia and Vitamin D Deficiency.     Labile HTN is followed expectantly and predates since 2010. Patient's BP has been controlled at home.  Today's BP is at goal - 130/70. Patient denies any cardiac symptoms as chest pain, palpitations, shortness of breath, dizziness or ankle swelling. Patient also has Gout since 2007 controlled w/Uloric. Patient expresses concern re: high co pay for Uloric which he was rx'd initially by Dr Trudie Reed w/o a trial on Allopurinol . His record states he had a rash with Allopurinol which he states is incorrect as he has never taken Allopurinol (so it is removed from his drug allergies).      Patient's hyperlipidemia is controlled with diet and medications. Patient denies myalgias or other medication SE's. Last lipids were at goal albeit elevated Trigs: Lab Results  Component Value Date   CHOL 122 09/08/2016   HDL 29 (L) 09/08/2016   LDLCALC 60 09/08/2016   TRIG 166 (H) 09/08/2016   CHOLHDL 4.2 09/08/2016      Patient has T2_NIDDM since 2013 altho Metformin wasn't started until March 2014.  Patient denies reactive hypoglycemic symptoms, visual blurring, diabetic polys or paresthesias. Last A1c was not at goal: Lab Results  Component Value Date   HGBA1C 6.7 (H) 09/08/2016       Finally, patient has history of Vitamin D Deficiency ("34" in 2012) and last vitamin D was still low: Lab Results  Component Value Date   VD25OH  82 09/08/2016   Current Outpatient Prescriptions on File Prior to Visit  Medication Sig  . aspirin EC 81 MG tablet Take 81 mg by mouth at bedtime.  . B Complex-C (SUPER B COMPLEX PO) Take 1 tablet by mouth daily.  . Febuxostat (ULORIC) 80 MG TABS TAKE 1 TABLET DAILY TO PREVENT GOUT  . fenofibrate micronized (LOFIBRA) 134 MG capsule TAKE 1 CAPSULE DAILY TO PREVENT GOUT  . glucose blood test strip 1 each by Other route as needed for other. Use as instructed  . glucose monitoring kit (FREESTYLE) monitoring kit 1 each by Does not apply route as needed for other.  Marland Kitchen JANUVIA 100 MG tablet TAKE 1 TABLET DAILY FOR DIABETES  . Lancets (FREESTYLE) lancets 1 each by Other route as needed for other. Use as instructed  . metFORMIN (GLUCOPHAGE-XR) 500 MG 24 hr tablet TAKE 2 TABLETS TWICE A DAY  . Omega-3 Fatty Acids (FISH OIL) 1000 MG CAPS Take 1,000 mg by mouth 2 (two) times daily.   . Vitamin D, Cholecalciferol, 1000 UNITS TABS Take 1,000 mg by mouth 2 (two) times daily.   . montelukast (SINGULAIR) 10 MG tablet Take 1 tablet daily for Allergies   No current facility-administered medications on file prior to visit.    Allergies  Allergen Reactions  . Levaquin [Levofloxacin]     GI Upset  .  ERROR. Valtrex [Valacyclovir Hcl] Hives   Past Medical History:  Diagnosis Date  . Diabetes mellitus without complication ([place])   . H/O cluster headache   . Vitamin D deficiency    Health Maintenance  Topic Date Due  . OPHTHALMOLOGY EXAM  04/26/1966  . COLONOSCOPY  04/26/2004  . FOOT EXAM  09/29/2016  . URINE MICROALBUMIN  09/29/2016  . HEMOGLOBIN A1C  03/08/2017  . INFLUENZA VACCINE  03/31/2017  . PNEUMOCOCCAL POLYSACCHARIDE VACCINE (2) 02/06/2021  . TETANUS/TDAP  09/29/2025  . Hepatitis C Screening  Completed  . HIV Screening  Completed   Immunization History  Administered Date(s) Administered  . Influenza Split 05/15/2013, 07/17/2014, 06/18/2015  . Influenza, Seasonal, Injecte,  Preservative Fre 09/08/2016  . PPD Test 07/17/2014, 09/30/2015  . Pneumococcal Polysaccharide-23 02/07/2016  . Tdap 09/30/2015   History reviewed. No pertinent surgical history.   Family History  Problem Relation Age of Onset  . Hypertension Father   . Diabetes Father   . Heart attack Father    Social History   Social History  . Marital status: Married    Spouse name: N/A  . Number of children: N/A  . Years of education: N/A   Occupational History  . Sales for [employer]   Social History Main Topics  . Smoking status: Never Smoker  . Smokeless tobacco: Not on file  . Alcohol use No  . Drug use: Unknown  . Sexual activity: Not on file    ROS Constitutional: Denies fever, chills, weight loss/gain, headaches, insomnia,  night sweats or change in appetite. Does c/o fatigue. Eyes: Denies redness, blurred vision, diplopia, discharge, itchy or watery eyes.  ENT: Denies discharge, congestion, post nasal drip, epistaxis, sore throat, earache, hearing loss, dental pain, Tinnitus, Vertigo, Sinus pain or snoring.  Cardio: Denies chest pain, palpitations, irregular heartbeat, syncope, dyspnea, diaphoresis, orthopnea, PND, claudication or edema Respiratory: denies cough, dyspnea, DOE, pleurisy, hoarseness, laryngitis or wheezing.  Gastrointestinal: Denies dysphagia, heartburn, reflux, water brash, pain, cramps, nausea, vomiting, bloating, diarrhea, constipation, hematemesis, melena, hematochezia, jaundice or hemorrhoids Genitourinary: Denies dysuria, frequency, urgency, nocturia, hesitancy, discharge, hematuria or flank pain Musculoskeletal: Denies arthralgia, myalgia, stiffness, Jt. Swelling, pain, limp or strain/sprain. Denies Falls. Skin: Denies puritis, rash, hives, warts, acne, eczema or change in skin lesion Neuro: No weakness, tremor, incoordination, spasms, paresthesia or pain Psychiatric: Denies confusion, memory loss or sensory loss. Denies  Depression. Endocrine: Denies change in weight, skin, hair change, nocturia, and paresthesia, diabetic polys, visual blurring or hyper / hypo glycemic episodes.  Heme/Lymph: No excessive bleeding, bruising or enlarged lymph nodes.  Physical Exam  BP 130/70   Pulse (!) 40   Temp 98.1 F (36.7 C)   Resp 16   Ht 6' 3.5" (1.918 m)   Wt 219 lb (99.3 kg)   BMI 27.01 kg/m   General Appearance: Well nourished and well groomed and in no apparent distress.  Eyes: PERRLA, EOMs, conjunctiva no swelling or erythema, normal fundi and vessels. Sinuses: No frontal/maxillary tenderness ENT/Mouth: EACs patent / TMs  nl. Nares clear without erythema, swelling, mucoid exudates. Oral hygiene is good. No erythema, swelling, or exudate. Tongue normal, non-obstructing. Tonsils not swollen or erythematous. Hearing normal.  Neck: Supple, thyroid normal. No bruits, nodes or JVD. Respiratory: Respiratory effort normal.  BS equal and clear bilateral without rales, rhonci, wheezing or stridor. Cardio: Heart sounds are normal with regular rate and rhythm and no murmurs, rubs or gallops. Peripheral pulses are normal and equal bilaterally without edema. No  aortic or femoral bruits. Chest: symmetric with normal excursions and percussion.  Abdomen: Soft, with Nl bowel sounds. Nontender, no guarding, rebound, hernias, masses, or organomegaly.  Lymphatics: Non tender without lymphadenopathy.  Genitourinary: No hernias.Testes nl. DRE - prostate nl for age - smooth & firm w/o nodules. Musculoskeletal: Full ROM all peripheral extremities, joint stability, 5/5 strength, and normal gait. Skin: Warm and dry without rashes, lesions, cyanosis, clubbing or  ecchymosis.  Neuro: Cranial nerves intact, reflexes equal bilaterally. Normal muscle tone, no cerebellar symptoms. Sensation intact to touch, vibratory and Monofilament to the toes bilaterally. Pysch: Alert and oriented X 3 with normal affect, insight and judgment appropriate.    Assessment and Plan  1. Annual Preventative/Screening Exam   2. Essential hypertension  - EKG 12-Lead - [place], RETROPERITNL ABD,  LTD - Urinalysis, Routine w reflex microscopic - Microalbumin / creatinine urine ratio - CBC with Differential/Platelet - BASIC METABOLIC PANEL WITH GFR - Magnesium - TSH  3. Hyperlipidemia, mixed  - Hepatic function panel - Lipid panel - TSH  4. Type 2 diabetes mellitus with stage 2 chronic kidney disease, without long-term current use of insulin (HCC)  - HM DIABETES FOOT EXAM - LOW EXTREMITY NEUR EXAM DOCUM - Hemoglobin A1c - Insulin, random  5. Vitamin D deficiency  - VITAMIN D 25 Hydroxy   6. Idiopathic gout  - Uric acid  7. Screening for rectal cancer  - POC Hemoccult Bld/Stl   8. Prostate cancer screening  - PSA  9. Screening examination for pulmonary tuberculosis   10. Screening for ischemic heart disease   11. Screening for AAA (aortic abdominal aneurysm)   12. Fatigue  - Vitamin B12 - Iron and TIBC - Testosterone - CBC with Differential/Platelet - TSH  13. Medication management  - Urinalysis, Routine w reflex microscopic - Microalbumin / creatinine urine ratio - Uric acid - CBC with Differential/Platelet - BASIC METABOLIC PANEL WITH GFR - Hepatic function panel - Magnesium - Lipid panel - TSH - Hemoglobin A1c - Insulin, random - VITAMIN D 25 Hydroxy       Patient was counseled in prudent diet, weight control to achieve/maintain BMI less than 25, BP monitoring, regular exercise and medications as discussed.  Discussed med effects and SE's. Routine screening labs and tests as requested with regular follow-up as recommended. Over 40 minutes of exam, counseling, chart review and high complex critical decision making was performed

## 2017-02-03 LAB — BASIC METABOLIC PANEL WITH GFR
BUN: 22 mg/dL (ref 7–25)
CO2: 20 mmol/L (ref 20–31)
CREATININE: 1.36 mg/dL — AB (ref 0.70–1.25)
Calcium: 10.2 mg/dL (ref 8.6–10.3)
Chloride: 104 mmol/L (ref 98–110)
GFR, EST AFRICAN AMERICAN: 64 mL/min (ref >=60)
GFR, Est Non African American: 55 mL/min — ABNORMAL LOW (ref >=60)
GLUCOSE: 114 mg/dL — AB (ref 65–99)
Potassium: 4.5 mmol/L (ref 3.5–5.3)
Sodium: 138 mmol/L (ref 135–146)

## 2017-02-03 LAB — URINALYSIS, ROUTINE W REFLEX MICROSCOPIC
Bilirubin Urine: NEGATIVE
Glucose, UA: NEGATIVE
HGB URINE DIPSTICK: NEGATIVE
KETONES UR: NEGATIVE
Leukocytes, UA: NEGATIVE
NITRITE: NEGATIVE
PROTEIN: NEGATIVE
SPECIFIC GRAVITY, URINE: 1.02 (ref 1.001–1.035)
pH: 5.5 (ref 5.0–8.0)

## 2017-02-03 LAB — HEMOGLOBIN A1C
Hgb A1c MFr Bld: 7 % — ABNORMAL HIGH (ref <5.7)
MEAN PLASMA GLUCOSE: 154 mg/dL

## 2017-02-03 LAB — HEPATIC FUNCTION PANEL
ALBUMIN: 4.8 g/dL (ref 3.6–5.1)
ALT: 29 U/L (ref 9–46)
AST: 26 U/L (ref 10–35)
Alkaline Phosphatase: 66 U/L (ref 40–115)
BILIRUBIN TOTAL: 0.5 mg/dL (ref 0.2–1.2)
Bilirubin, Direct: 0.1 mg/dL (ref <=0.2)
Indirect Bilirubin: 0.4 mg/dL (ref 0.2–1.2)
TOTAL PROTEIN: 7.9 g/dL (ref 6.1–8.1)

## 2017-02-03 LAB — MICROALBUMIN / CREATININE URINE RATIO
CREATININE, URINE: 195 mg/dL (ref 20–370)
MICROALB UR: 0.2 mg/dL
Microalb Creat Ratio: 1 mcg/mg creat (ref <30)

## 2017-02-03 LAB — IRON AND TIBC
%SAT: 26 % (ref 15–60)
Iron: 105 ug/dL (ref 50–180)
TIBC: 400 ug/dL (ref 250–425)
UIBC: 295 ug/dL

## 2017-02-03 LAB — LIPID PANEL
Cholesterol: 142 mg/dL (ref <200)
HDL: 34 mg/dL — AB (ref >40)
LDL Cholesterol: 74 mg/dL (ref <100)
TRIGLYCERIDES: 171 mg/dL — AB (ref <150)
Total CHOL/HDL Ratio: 4.2 Ratio (ref <5.0)
VLDL: 34 mg/dL — ABNORMAL HIGH (ref <30)

## 2017-02-03 LAB — MAGNESIUM: MAGNESIUM: 2 mg/dL (ref 1.5–2.5)

## 2017-02-03 LAB — URIC ACID: Uric Acid, Serum: 5.7 mg/dL (ref 4.0–8.0)

## 2017-02-03 LAB — VITAMIN B12: Vitamin B-12: 274 pg/mL (ref 200–1100)

## 2017-02-03 LAB — TESTOSTERONE: TESTOSTERONE: 407 ng/dL (ref 250–827)

## 2017-02-03 LAB — TSH: TSH: 1.63 m[IU]/L (ref 0.40–4.50)

## 2017-02-03 LAB — PSA: PSA: 0.3 ng/mL (ref <=4.0)

## 2017-02-03 LAB — VITAMIN D 25 HYDROXY (VIT D DEFICIENCY, FRACTURES): VIT D 25 HYDROXY: 62 ng/mL (ref 30–100)

## 2017-02-03 LAB — INSULIN, RANDOM: INSULIN: 5.3 u[IU]/mL (ref 2.0–19.6)

## 2017-02-04 LAB — TB SKIN TEST
INDURATION: 0 mm
TB Skin Test: NEGATIVE

## 2017-02-08 ENCOUNTER — Telehealth: Payer: Self-pay | Admitting: *Deleted

## 2017-02-08 ENCOUNTER — Other Ambulatory Visit: Payer: Self-pay | Admitting: Internal Medicine

## 2017-02-08 NOTE — Telephone Encounter (Signed)
Patient called and states his pulse was about 39 yesterday.  The patient walked 2 miles this AM and feels light-headed. Per Dr Oneta RackMcKeown, monitor his BP and pulse sitting and then standing.  Patient was advised to call back with results.  The patient has an appointment to get a 24 hout halter monitor on 02/16/2017.

## 2017-02-16 ENCOUNTER — Ambulatory Visit (INDEPENDENT_AMBULATORY_CARE_PROVIDER_SITE_OTHER): Payer: BLUE CROSS/BLUE SHIELD

## 2017-02-16 ENCOUNTER — Other Ambulatory Visit: Payer: Self-pay | Admitting: Internal Medicine

## 2017-02-16 DIAGNOSIS — E1122 Type 2 diabetes mellitus with diabetic chronic kidney disease: Secondary | ICD-10-CM

## 2017-02-16 DIAGNOSIS — I499 Cardiac arrhythmia, unspecified: Secondary | ICD-10-CM

## 2017-02-16 DIAGNOSIS — E782 Mixed hyperlipidemia: Secondary | ICD-10-CM

## 2017-02-16 DIAGNOSIS — N182 Chronic kidney disease, stage 2 (mild): Secondary | ICD-10-CM

## 2017-02-16 DIAGNOSIS — I498 Other specified cardiac arrhythmias: Secondary | ICD-10-CM

## 2017-02-16 DIAGNOSIS — I10 Essential (primary) hypertension: Secondary | ICD-10-CM

## 2017-02-16 DIAGNOSIS — I493 Ventricular premature depolarization: Secondary | ICD-10-CM

## 2017-02-19 ENCOUNTER — Other Ambulatory Visit: Payer: Self-pay | Admitting: Internal Medicine

## 2017-02-19 DIAGNOSIS — I472 Ventricular tachycardia, unspecified: Secondary | ICD-10-CM

## 2017-02-19 MED ORDER — ATENOLOL 50 MG PO TABS: ORAL_TABLET | ORAL | 1 refills | Status: DC

## 2017-02-23 ENCOUNTER — Telehealth: Payer: Self-pay | Admitting: *Deleted

## 2017-02-23 NOTE — Telephone Encounter (Signed)
Patient called and states he is still waiting to get a cardiology appointment and has a vacation planned for next week.  Per Dr Oneta RackMcKeown, he should be OK since he has started the Atenolol to help with his heart rhythm.  The patient is aware.

## 2017-03-11 ENCOUNTER — Ambulatory Visit (INDEPENDENT_AMBULATORY_CARE_PROVIDER_SITE_OTHER): Payer: BLUE CROSS/BLUE SHIELD | Admitting: Cardiovascular Disease

## 2017-03-11 VITALS — BP 100/60 | HR 62 | Ht 75.5 in | Wt 215.8 lb

## 2017-03-11 DIAGNOSIS — I472 Ventricular tachycardia: Secondary | ICD-10-CM

## 2017-03-11 DIAGNOSIS — I4729 Other ventricular tachycardia: Secondary | ICD-10-CM

## 2017-03-11 NOTE — Patient Instructions (Signed)
Medication Instructions:  Your physician recommends that you continue on your current medications as directed. Please refer to the Current Medication list given to you today.   Labwork: none  Testing/Procedures: Your physician has requested that you have an echocardiogram. Echocardiography is a painless test that uses sound waves to create images of your heart. It provides your doctor with information about the size and shape of your heart and how well your heart's chambers and valves are working. This procedure takes approximately one hour. There are no restrictions for this procedure.    Follow-Up: Your physician recommends that you schedule a follow-up appointment in: about 2 months.  Scheduled for September 17,2018 at 8:40   Any Other Special Instructions Will Be Listed Below (If Applicable).     If you need a refill on your cardiac medications before your next appointment, please call your pharmacy.

## 2017-03-11 NOTE — Progress Notes (Signed)
Chief Complaint  Patient presents with  . Palpitations    History of Present Illness: 61 yo male with history of DM who is here today as a new consult for evaluation of abnormal cardiac monitor. He is referred by Dr. Melford Aase. EKG in June 2018 with sinus/PVCs with bigeminy. He has recently worn a cardiac monitor and was found to have several episodes of non-sustained ventricular tachycardia. Baseline rhythm was sinus. He was also noted to have sinus bradycardia with lowest heart rate 41 bpm. Rare PACs. He was started on atenolol and feels great. Prior to the addition of the beta blocker, he was having frequent episodes of "fluttering" in his chest. He has less palpitations now, mostly aware of them at night. He is very active. He has no chest pain, dyspnea, LE edema. Dizziness, near syncope or syncope. No prior cardiac issues.   Primary Care Physician: Unk Pinto, MD   Past Medical History:  Diagnosis Date  . Calculus of gallbladder with acute cholecystitis 11/08/2014   Via CT AB 2015   . Diabetes mellitus without complication ([place])   . Fatty liver disease, nonalcoholic 01/25/7823  . Gout 07/17/2014  . H/O cluster headache   . History of nephrolithiasis 11/08/2014  . 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 Vitamin D deficiency     Past Surgical History:  Procedure Laterality Date  . NASAL SINUS SURGERY    . TOE SURGERY    . VARICOCELE EXCISION      Current Outpatient Prescriptions  Medication Sig Dispense Refill  . allopurinol (ZYLOPRIM) 300 MG tablet Take 1 tablet daily to prevent Gout 90 tablet 3  . aspirin EC 81 MG tablet Take 81 mg by mouth at bedtime.    Marland Kitchen atenolol (TENORMIN) 50 MG tablet Take 1 tablet daily to prevent Heart Arrhythmia. 90 tablet 1  . B Complex-C (SUPER B COMPLEX PO) Take 1 tablet by mouth daily.    . fenofibrate micronized (LOFIBRA) 134 MG capsule TAKE 1 CAPSULE DAILY TO PREVENT GOUT 90 capsule 1  . glucose blood test strip 1 each by Other route as needed  for other. Use as instructed    . glucose monitoring kit (FREESTYLE) monitoring kit 1 each by Does not apply route as needed for other.    . Lancets (FREESTYLE) lancets 1 each by Other route as needed for other. Use as instructed    . metFORMIN (GLUCOPHAGE-XR) 500 MG 24 hr tablet TAKE 2 TABLETS TWICE A DAY 120 tablet 0  . montelukast (SINGULAIR) 10 MG tablet Take 1 tablet daily for Allergies 90 tablet 1  . Omega-3 Fatty Acids (FISH OIL) 1000 MG CAPS Take 1,000 mg by mouth 2 (two) times daily.     . sitaGLIPtin (JANUVIA) 100 MG tablet Take 100 mg by mouth daily.    . Vitamin D, Cholecalciferol, 1000 UNITS TABS Take 1,000 mg by mouth 2 (two) times daily.      No current facility-administered medications for this visit.     Allergies  Allergen Reactions  . Levaquin [Levofloxacin]     GI Upset  . Valtrex [Valacyclovir Hcl] Hives    Social History   Social History  . Marital status: Married    Spouse name: N/A  . Number of children: 1  . Years of education: N/A   Occupational History  . Counter supervisor    Social History Main Topics  . Smoking status: Never Smoker  . Smokeless tobacco: Never Used  . Alcohol use No  .  Drug use: No  . Sexual activity: Not on file   Other Topics Concern  . Not on file   Social History Narrative  . No narrative on file    Family History  Problem Relation Age of Onset  . Hypertension Father   . Diabetes Father   . Heart attack Father 20    Review of Systems:  As stated in the HPI and otherwise negative.   BP 100/60   Pulse 62   Ht 6' 3.5" (1.918 m)   Wt 215 lb 12.8 oz (97.9 kg)   SpO2 96%   BMI 26.62 kg/m   Physical Examination: General: Well developed, well nourished, NAD  HEENT: OP clear, mucus membranes moist  SKIN: warm, dry. No rashes. Neuro: No focal deficits  Musculoskeletal: Muscle strength 5/5 all ext  Psychiatric: Mood and affect normal  Neck: No JVD, no carotid bruits, no thyromegaly, no lymphadenopathy.    Lungs:Clear bilaterally, no wheezes, rhonci, crackles Cardiovascular: Regular rate and rhythm. No murmurs, gallops or rubs. Abdomen:Soft. Bowel sounds present. Non-tender.  Extremities: No lower extremity edema. Pulses are 2 + in the bilateral DP/PT.  EKG:  EKG is not ordered today. The ekg ordered today demonstrates   Recent Labs: 02/02/2017: ALT 29; BUN 22; Creat 1.36; Hemoglobin 14.7; Magnesium 2.0; Platelets 299; Potassium 4.5; Sodium 138; TSH 1.63   Lipid Panel    Component Value Date/Time   CHOL 142 02/02/2017 0938   TRIG 171 (H) 02/02/2017 0938   HDL 34 (L) 02/02/2017 0938   CHOLHDL 4.2 02/02/2017 0938   VLDL 34 (H) 02/02/2017 0938   LDLCALC 74 02/02/2017 0938     Wt Readings from Last 3 Encounters:  03/11/17 215 lb 12.8 oz (97.9 kg)  02/02/17 219 lb (99.3 kg)  09/08/16 225 lb (102.1 kg)     Other studies Reviewed: Additional studies/ records that were reviewed today include:  Review of the above records demonstrates:   Assessment and Plan:   1. Non-sustained VT: He had several 7 beat runs of VT on his cardiac monitor. He has well controlled DM but no HTN or HLD. He has never been a smoker. He has no chest pain or dyspnea. Fluttering improved on beta blocker. Will arrange echo to assess LV systolic function, exclude structural heart disease. I do not think he needs an ischemic evaluation at this time. Continue atenolol. I will see him back after his echo.   Current medicines are reviewed at length with the patient today.  The patient does not have concerns regarding medicines.  The following changes have been made:  no change  Labs/ tests ordered today include:   Orders Placed This Encounter  Procedures  . ECHOCARDIOGRAM COMPLETE     Disposition:   FU with me in 8 weeks   Signed, Lauree Chandler, MD 03/11/2017 6:11 PM    [facility] [address], [place], [place]  [address] Phone: [phone]; Fax: [phone]

## 2017-03-19 ENCOUNTER — Ambulatory Visit (HOSPITAL_COMMUNITY): Payer: BLUE CROSS/BLUE SHIELD | Attending: Cardiovascular Disease

## 2017-03-19 ENCOUNTER — Other Ambulatory Visit: Payer: Self-pay

## 2017-03-19 DIAGNOSIS — I472 Ventricular tachycardia: Secondary | ICD-10-CM | POA: Insufficient documentation

## 2017-03-19 DIAGNOSIS — I4729 Other ventricular tachycardia: Secondary | ICD-10-CM

## 2017-03-19 DIAGNOSIS — I5189 Other ill-defined heart diseases: Secondary | ICD-10-CM | POA: Diagnosis not present

## 2017-03-22 ENCOUNTER — Other Ambulatory Visit: Payer: Self-pay | Admitting: *Deleted

## 2017-03-22 DIAGNOSIS — E1122 Type 2 diabetes mellitus with diabetic chronic kidney disease: Secondary | ICD-10-CM

## 2017-03-22 DIAGNOSIS — N182 Chronic kidney disease, stage 2 (mild): Secondary | ICD-10-CM

## 2017-03-22 DIAGNOSIS — E1121 Type 2 diabetes mellitus with diabetic nephropathy: Secondary | ICD-10-CM

## 2017-03-22 MED ORDER — MONTELUKAST SODIUM 10 MG PO TABS: ORAL_TABLET | ORAL | 1 refills | Status: DC

## 2017-03-22 MED ORDER — METFORMIN HCL ER 500 MG PO TB24: 1000.0000 mg | ORAL_TABLET | Freq: Two times a day (BID) | ORAL | 1 refills | Status: DC

## 2017-03-22 MED ORDER — GLUCOSE BLOOD VI STRP: ORAL_STRIP | 1 refills | Status: DC

## 2017-05-07 ENCOUNTER — Other Ambulatory Visit: Payer: Self-pay | Admitting: Internal Medicine

## 2017-05-07 DIAGNOSIS — E782 Mixed hyperlipidemia: Secondary | ICD-10-CM

## 2017-05-17 ENCOUNTER — Encounter: Payer: Self-pay | Admitting: Cardiovascular Disease

## 2017-05-17 ENCOUNTER — Ambulatory Visit (INDEPENDENT_AMBULATORY_CARE_PROVIDER_SITE_OTHER): Payer: BLUE CROSS/BLUE SHIELD | Admitting: Cardiovascular Disease

## 2017-05-17 VITALS — BP 118/70 | HR 61 | Ht 75.5 in | Wt 223.4 lb

## 2017-05-17 DIAGNOSIS — I4729 Other ventricular tachycardia: Secondary | ICD-10-CM

## 2017-05-17 DIAGNOSIS — I472 Ventricular tachycardia: Secondary | ICD-10-CM | POA: Diagnosis not present

## 2017-05-17 NOTE — Patient Instructions (Signed)

## 2017-05-17 NOTE — Progress Notes (Signed)
Chief Complaint  Patient presents with  . Follow-up    NSVT    History of Present Illness: 61 yo male with history of DM who is here today for follow up. I saw him as a new patient 03/11/17 for evaluation of abnormal cardiac monitor. He had an EKG in June 2018 with sinus/PVCs with bigeminy. He then wore a cardiac monitor in June 2018 and was found to have several episodes of non-sustained ventricular tachycardia. Baseline rhythm was sinus. He was also noted to have sinus bradycardia with lowest heart rate 41 bpm. Rare PACs. He was started on atenolol and tolerated this well. Prior to the addition of the beta blocker, he was having frequent episodes of "fluttering" in his chest. No prior cardiac issues. Echo 03/19/17 with normal LV size and systolic function. Grade 2 diastolic dysfunction noted. No valve disease.   He is here today for follow up of NSVT. The patient denies any chest pain, dyspnea, palpitations, lower extremity edema, orthopnea, PND, dizziness, near syncope or syncope.   Primary Care Physician: Unk Pinto, MD   Past Medical History:  Diagnosis Date  . Calculus of gallbladder with acute cholecystitis 11/08/2014   Via CT AB 2015   . Diabetes mellitus without complication ([place])   . Fatty liver disease, nonalcoholic 8/85/0277  . Gout 07/17/2014  . H/O cluster headache   . History of nephrolithiasis 11/08/2014  . 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 Vitamin D deficiency     Past Surgical History:  Procedure Laterality Date  . NASAL SINUS SURGERY    . TOE SURGERY    . VARICOCELE EXCISION      Current Outpatient Prescriptions  Medication Sig Dispense Refill  . allopurinol (ZYLOPRIM) 300 MG tablet Take 1 tablet daily to prevent Gout 90 tablet 3  . aspirin EC 81 MG tablet Take 81 mg by mouth at bedtime.    Marland Kitchen atenolol (TENORMIN) 50 MG tablet Take 1 tablet daily to prevent Heart Arrhythmia. 90 tablet 1  . B Complex-C (SUPER B COMPLEX PO) Take 1 tablet by mouth daily.      . fenofibrate micronized (LOFIBRA) 134 MG capsule TAKE 1 CAPSULE DAILY TO PREVENT GOUT 90 capsule 1  . glucose blood test strip Check blood sugar 1 time daily. DX-E11.22 100 each 1  . glucose monitoring kit (FREESTYLE) monitoring kit 1 each by Does not apply route as needed for other.    . Lancets (FREESTYLE) lancets 1 each by Other route as needed for other. Use as instructed    . metFORMIN (GLUCOPHAGE-XR) 500 MG 24 hr tablet Take 2 tablets (1,000 mg total) by mouth 2 (two) times daily. 360 tablet 1  . montelukast (SINGULAIR) 10 MG tablet Take 1 tablet daily for Allergies 90 tablet 1  . Omega-3 Fatty Acids (FISH OIL) 1000 MG CAPS Take 1,000 mg by mouth 2 (two) times daily.     . Vitamin D, Cholecalciferol, 1000 UNITS TABS Take 1,000 mg by mouth 2 (two) times daily.      No current facility-administered medications for this visit.     Allergies  Allergen Reactions  . Levaquin [Levofloxacin]     GI Upset  . Valtrex [Valacyclovir Hcl] Hives    Social History   Social History  . Marital status: Married    Spouse name: N/A  . Number of children: 1  . Years of education: N/A   Occupational History  . Counter supervisor    Social History Main Topics  .  Smoking status: Never Smoker  . Smokeless tobacco: Never Used  . Alcohol use No  . Drug use: No  . Sexual activity: Not on file   Other Topics Concern  . Not on file   Social History Narrative  . No narrative on file    Family History  Problem Relation Age of Onset  . Hypertension Father   . Diabetes Father   . Heart attack Father 22    Review of Systems:  As stated in the HPI and otherwise negative.   BP 118/70   Pulse 61   Ht 6' 3.5" (1.918 m)   Wt 223 lb 6.4 oz (101.3 kg)   SpO2 97%   BMI 27.55 kg/m   Physical Examination:  General: Well developed, well nourished, NAD  HEENT: OP clear, mucus membranes moist  SKIN: warm, dry. No rashes. Neuro: No focal deficits  Musculoskeletal: Muscle strength 5/5 all  ext  Psychiatric: Mood and affect normal  Neck: No JVD, no carotid bruits, no thyromegaly, no lymphadenopathy.  Lungs:Clear bilaterally, no wheezes, rhonci, crackles Cardiovascular: Regular rate and rhythm. No murmurs, gallops or rubs. Abdomen:Soft. Bowel sounds present. Non-tender.  Extremities: No lower extremity edema. Pulses are 2 + in the bilateral DP/PT.  Echo 03/19/17: Left ventricle: The cavity size was normal. Wall thickness was   increased in a pattern of moderate LVH. Systolic function was   normal. The estimated ejection fraction was in the range of 55%   to 60%. Wall motion was normal; there were no regional wall   motion abnormalities. Features are consistent with a pseudonormal   left ventricular filling pattern, with concomitant abnormal   relaxation and increased filling pressure (grade 2 diastolic   dysfunction).   EKG:  EKG is not  ordered today. The ekg ordered today demonstrates   Recent Labs: 02/02/2017: ALT 29; BUN 22; Creat 1.36; Hemoglobin 14.7; Magnesium 2.0; Platelets 299; Potassium 4.5; Sodium 138; TSH 1.63   Lipid Panel    Component Value Date/Time   CHOL 142 02/02/2017 0938   TRIG 171 (H) 02/02/2017 0938   HDL 34 (L) 02/02/2017 0938   CHOLHDL 4.2 02/02/2017 0938   VLDL 34 (H) 02/02/2017 0938   LDLCALC 74 02/02/2017 0938     Wt Readings from Last 3 Encounters:  05/17/17 223 lb 6.4 oz (101.3 kg)  03/11/17 215 lb 12.8 oz (97.9 kg)  02/02/17 219 lb (99.3 kg)     Other studies Reviewed: Additional studies/ records that were reviewed today include:  Review of the above records demonstrates:   Assessment and Plan:   1. Non-sustained VT: He had palpitations and NSVT documented on outpatient cardiac monitor. He is now on a beta blocker and having no palpitations. Echo with normal LV function. I have reassured him today that no further workup is needed at this time. He will alert [place] of any change in his clinical status.    Current medicines are  reviewed at length with the patient today.  The patient does not have concerns regarding medicines.  The following changes have been made:  no change  Labs/ tests ordered today include:   No orders of the defined types were placed in this encounter.    Disposition:   FU with me in 12 months   Signed, Lauree Chandler, MD 05/17/2017 10:26 AM    [facility] [place], [place], [place]  [address] Phone: [phone]; Fax: [phone]

## 2017-05-18 NOTE — Progress Notes (Signed)
Assessment and Plan:  Flu vaccine need -     Flu vaccine 6-50mopreservative free IM  Essential hypertension - continue medications, DASH diet, exercise and monitor at home. Call if greater than 130/80.  -     CBC with Differential/Platelet -     BASIC METABOLIC PANEL WITH GFR -     TSH  Fatty liver disease, nonalcoholic Weight loss advised, avoid alcohol/tylenol, will monitor LFTs -     Hepatic function panel  Type 2 diabetes mellitus with stage 2 chronic kidney disease, without long-term current use of insulin (HCC) -     Hemoglobin A1c Discussed general issues about diabetes pathophysiology and management., Educational material distributed., Suggested low cholesterol diet., Encouraged aerobic exercise., Discussed foot care., Reminded to get yearly retinal exam.  Mixed hyperlipidemia -continue medications, check lipids, decrease fatty foods, increase activity.  -     Lipid panel  Medication management -     Magnesium  Gout, unspecified cause, unspecified chronicity, unspecified site Gout- recheck Uric acid as needed, Diet discussed, continue medications.   Continue diet and meds as discussed. Further disposition pending results of labs. Discussed med's effects and SE's.    HPI 61y.o. male  presents for 3 month follow up HTN, Chol, DM with CKD, and vitamin D def.   His blood pressure has been controlled at home, today his BP is BP: 116/80.  He does not workout. He denies chest pain, shortness of breath, dizziness. 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 MAngelena Form  He is on cholesterol medication, fenofibrate/lipitor and denies myalgias. His cholesterol is at goal. The cholesterol was:  02/02/2017: Cholesterol 142; HDL 34; LDL Cholesterol 74; Triglycerides 171  He has been working on diet and exercise for diabetes with diabetic chronic kidney disease, he is on bASA, he is not on ACE/ARB due to hypotension, MF '2000mg'$  and cinnamon '1000mg'$  daliy, this AM was 120-130,  no hypoglycemia, and denies  paresthesia of the feet, polydipsia, polyuria and visual disturbances. Last A1C was: 02/02/2017: Hgb A1c MFr Bld 7.0  Lab Results  Component Value Date   GFRNONAA 55 (L) 02/02/2017   Patient is on Vitamin D supplement. 02/02/2017: Vit D, 25-Hydroxy 62 He sees Dr. HTrudie Reedfor gout and is on allopurinol no flares.  Lab Results  Component Value Date   LABURIC 5.7 02/02/2017   BMI is Body mass index is 27.11 kg/m., he is working on diet and exercise. Wt Readings from Last 3 Encounters:  05/20/17 219 lb 12.8 oz (99.7 kg)  05/17/17 223 lb 6.4 oz (101.3 kg)  03/11/17 215 lb 12.8 oz (97.9 kg)    Current Medications:  Current Outpatient Prescriptions on File Prior to Visit  Medication Sig Dispense Refill  . allopurinol (ZYLOPRIM) 300 MG tablet Take 1 tablet daily to prevent Gout 90 tablet 3  . aspirin EC 81 MG tablet Take 81 mg by mouth at bedtime.    .Marland Kitchenatenolol (TENORMIN) 50 MG tablet Take 1 tablet daily to prevent Heart Arrhythmia. 90 tablet 1  . B Complex-C (SUPER B COMPLEX PO) Take 1 tablet by mouth daily.    . fenofibrate micronized (LOFIBRA) 134 MG capsule TAKE 1 CAPSULE DAILY TO PREVENT GOUT 90 capsule 1  . glucose blood test strip Check blood sugar 1 time daily. DX-E11.22 100 each 1  . glucose monitoring kit (FREESTYLE) monitoring kit 1 each by Does not apply route as needed for other.    . Lancets (FREESTYLE) lancets 1 each  by Other route as needed for other. Use as instructed    . metFORMIN (GLUCOPHAGE-XR) 500 MG 24 hr tablet Take 2 tablets (1,000 mg total) by mouth 2 (two) times daily. 360 tablet 1  . montelukast (SINGULAIR) 10 MG tablet Take 1 tablet daily for Allergies 90 tablet 1  . Omega-3 Fatty Acids (FISH OIL) 1000 MG CAPS Take 1,000 mg by mouth 2 (two) times daily.     . Vitamin D, Cholecalciferol, 1000 UNITS TABS Take 1,000 mg by mouth 2 (two) times daily.      No current facility-administered medications on file prior to visit.    Medical  History:  Past Medical History:  Diagnosis Date  . Calculus of gallbladder with acute cholecystitis 11/08/2014   Via CT AB 2015   . Diabetes mellitus without complication ([place])   . Fatty liver disease, nonalcoholic 3/55/9741  . Gout 07/17/2014  . H/O cluster headache   . History of nephrolithiasis 11/08/2014  . 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 Vitamin D deficiency    Allergies:  Allergies  Allergen Reactions  . Levaquin [Levofloxacin]     GI Upset  . Valtrex [Valacyclovir Hcl] Hives     Review of Systems:  Review of Systems  Constitutional: Negative.   HENT: Negative.   Eyes: Negative.   Respiratory: Negative.   Cardiovascular: Negative.   Gastrointestinal: Negative.   Genitourinary: Negative.   Musculoskeletal: Negative.   Skin: Negative.   Neurological: Negative.   Endo/Heme/Allergies: Negative.   Psychiatric/Behavioral: Negative.     Family history- Review and unchanged Social history- Review and unchanged Physical Exam: BP 116/80   Pulse (!) 58   Temp (!) 97.4 F (36.3 C)   Resp 16   Ht 6' 3.5" (1.918 m)   Wt 219 lb 12.8 oz (99.7 kg)   SpO2 98%   BMI 27.11 kg/m  Wt Readings from Last 3 Encounters:  05/20/17 219 lb 12.8 oz (99.7 kg)  05/17/17 223 lb 6.4 oz (101.3 kg)  03/11/17 215 lb 12.8 oz (97.9 kg)   General Appearance: Well nourished, in no apparent distress. Eyes: PERRLA, EOMs, conjunctiva no swelling or erythema Sinuses: No Frontal/maxillary tenderness ENT/Mouth: Ext aud canals clear, TMs without erythema, bulging. No erythema, swelling, or exudate on post pharynx.  Tonsils not swollen or erythematous. Hearing normal.  Neck: Supple, thyroid normal.  Respiratory: Respiratory effort normal, BS equal bilaterally without rales, rhonchi, wheezing or stridor.  Cardio: RRR with no MRGs. Brisk peripheral pulses without edema.  Abdomen: Soft, + BS.  Non tender, no guarding, rebound, hernias, masses. Lymphatics: Non tender without lymphadenopathy.   Musculoskeletal: Full ROM, 5/5 strength, Normal gait Skin: Warm, dry without rashes, lesions, ecchymosis.  Neuro: Cranial nerves intact. No cerebellar symptoms.  Psych: Awake and oriented X 3, normal affect, Insight and Judgment appropriate.    Vicie Mutters, PA-C 9:41 AM [facility]

## 2017-05-20 ENCOUNTER — Ambulatory Visit (INDEPENDENT_AMBULATORY_CARE_PROVIDER_SITE_OTHER): Payer: BLUE CROSS/BLUE SHIELD | Admitting: Physician Assistant

## 2017-05-20 ENCOUNTER — Encounter: Payer: Self-pay | Admitting: Physician Assistant

## 2017-05-20 VITALS — BP 116/80 | HR 58 | Temp 97.4°F | Resp 16 | Ht 75.5 in | Wt 219.8 lb

## 2017-05-20 DIAGNOSIS — E782 Mixed hyperlipidemia: Secondary | ICD-10-CM | POA: Diagnosis not present

## 2017-05-20 DIAGNOSIS — E1122 Type 2 diabetes mellitus with diabetic chronic kidney disease: Secondary | ICD-10-CM

## 2017-05-20 DIAGNOSIS — N182 Chronic kidney disease, stage 2 (mild): Secondary | ICD-10-CM

## 2017-05-20 DIAGNOSIS — I10 Essential (primary) hypertension: Secondary | ICD-10-CM

## 2017-05-20 DIAGNOSIS — Z79899 Other long term (current) drug therapy: Secondary | ICD-10-CM

## 2017-05-20 DIAGNOSIS — K760 Fatty (change of) liver, not elsewhere classified: Secondary | ICD-10-CM

## 2017-05-20 DIAGNOSIS — Z23 Encounter for immunization: Secondary | ICD-10-CM

## 2017-05-20 DIAGNOSIS — M1000 Idiopathic gout, unspecified site: Secondary | ICD-10-CM | POA: Diagnosis not present

## 2017-05-20 NOTE — Patient Instructions (Signed)
Your A1C is a measure of your sugar over the past 3 months and is not affected by what you have eaten over the past few days. Diabetes increases your chances of stroke and heart attack over 300 % and is the leading cause of blindness and kidney failure in the [place]. Please make sure you decrease bad carbs like white bread, white rice, potatoes, corn, soft drinks, pasta, cereals, refined sugars, sweet tea, dried fruits, and fruit juice. Good carbs are okay to eat in moderation like sweet potatoes, brown rice, whole grain pasta/bread, most fruit (except dried fruit) and you can eat as many veggies as you want.   Greater than 6.5 is considered diabetic. Between 6.4 and 5.7 is prediabetic If your A1C is less than 5.7 you are NOT diabetic.  Targets for Glucose Readings: Time of Check Target for patients WITHOUT Diabetes Target for DIABETICS  Before Meals Less than 100  less than 150  Two hours after meals Less than 200  Less than 250       Bad carbs also include fruit juice, alcohol, and sweet tea. These are empty calories that do not signal to your brain that you are full.   Please remember the good carbs are still carbs which convert into sugar. So please measure them out no more than 1/2-1 cup of rice, oatmeal, pasta, and beans  Veggies are however free foods! Pile them on.   Not all fruit is created equal. Please see the list below, the fruit at the bottom is higher in sugars than the fruit at the top. Please avoid all dried fruits.     

## 2017-05-21 LAB — HEPATIC FUNCTION PANEL
AG RATIO: 1.7 (calc) (ref 1.0–2.5)
ALKALINE PHOSPHATASE (APISO): 62 U/L (ref 40–115)
ALT: 24 U/L (ref 9–46)
AST: 20 U/L (ref 10–35)
Albumin: 4.5 g/dL (ref 3.6–5.1)
BILIRUBIN TOTAL: 0.5 mg/dL (ref 0.2–1.2)
Bilirubin, Direct: 0.1 mg/dL (ref 0.0–0.2)
GLOBULIN: 2.6 g/dL (ref 1.9–3.7)
Indirect Bilirubin: 0.4 mg/dL (calc) (ref 0.2–1.2)
Total Protein: 7.1 g/dL (ref 6.1–8.1)

## 2017-05-21 LAB — HEMOGLOBIN A1C
HEMOGLOBIN A1C: 6.5 %{Hb} — AB (ref <5.7)
MEAN PLASMA GLUCOSE: 140 (calc)
eAG (mmol/L): 7.7 (calc)

## 2017-05-21 LAB — CBC WITH DIFFERENTIAL/PLATELET
BASOS ABS: 32 {cells}/uL (ref 0–200)
BASOS PCT: 0.5 %
EOS ABS: 211 {cells}/uL (ref 15–500)
EOS PCT: 3.3 %
HEMATOCRIT: 40.2 % (ref 38.5–50.0)
HEMOGLOBIN: 13.1 g/dL — AB (ref 13.2–17.1)
LYMPHS ABS: 1978 {cells}/uL (ref 850–3900)
MCH: 27.9 pg (ref 27.0–33.0)
MCHC: 32.6 g/dL (ref 32.0–36.0)
MCV: 85.7 fL (ref 80.0–100.0)
MPV: 11.7 fL (ref 7.5–12.5)
Monocytes Relative: 8 %
NEUTROS ABS: 3667 {cells}/uL (ref 1500–7800)
Neutrophils Relative %: 57.3 %
Platelets: 231 10*3/uL (ref 140–400)
RBC: 4.69 10*6/uL (ref 4.20–5.80)
RDW: 14 % (ref 11.0–15.0)
Total Lymphocyte: 30.9 %
WBC mixed population: 512 cells/uL (ref 200–950)
WBC: 6.4 10*3/uL (ref 3.8–10.8)

## 2017-05-21 LAB — BASIC METABOLIC PANEL WITH GFR
BUN: 23 mg/dL (ref 7–25)
CALCIUM: 9.8 mg/dL (ref 8.6–10.3)
CO2: 26 mmol/L (ref 20–32)
Chloride: 108 mmol/L (ref 98–110)
Creat: 1.21 mg/dL (ref 0.70–1.25)
GFR, EST AFRICAN AMERICAN: 73 mL/min/{1.73_m2} (ref >=60)
GFR, EST NON AFRICAN AMERICAN: 63 mL/min/{1.73_m2} (ref >=60)
Glucose, Bld: 120 mg/dL — ABNORMAL HIGH (ref 65–99)
POTASSIUM: 5.3 mmol/L (ref 3.5–5.3)
Sodium: 139 mmol/L (ref 135–146)

## 2017-05-21 LAB — LIPID PANEL
CHOLESTEROL: 110 mg/dL (ref <200)
HDL: 33 mg/dL — AB (ref >40)
LDL CHOLESTEROL (CALC): 58 mg/dL
Non-HDL Cholesterol (Calc): 77 mg/dL (calc) (ref <130)
TRIGLYCERIDES: 106 mg/dL (ref <150)
Total CHOL/HDL Ratio: 3.3 (calc) (ref <5.0)

## 2017-05-21 LAB — MAGNESIUM: Magnesium: 1.9 mg/dL (ref 1.5–2.5)

## 2017-05-21 LAB — TSH: TSH: 1.54 mIU/L (ref 0.40–4.50)

## 2017-05-21 NOTE — Progress Notes (Signed)
Pt aware of lab results & voiced understanding of those results.

## 2017-05-29 ENCOUNTER — Emergency Department (HOSPITAL_COMMUNITY): Payer: BLUE CROSS/BLUE SHIELD

## 2017-05-29 ENCOUNTER — Emergency Department (HOSPITAL_COMMUNITY)
Admission: EM | Admit: 2017-05-29 | Discharge: 2017-05-29 | Disposition: A | Discharge status: Home / Self Care | Payer: BLUE CROSS/BLUE SHIELD | Attending: Emergency Medicine | Admitting: Emergency Medicine

## 2017-05-29 ENCOUNTER — Encounter (HOSPITAL_COMMUNITY): Payer: Self-pay | Admitting: Emergency Medicine

## 2017-05-29 DIAGNOSIS — R112 Nausea with vomiting, unspecified: Secondary | ICD-10-CM | POA: Diagnosis not present

## 2017-05-29 DIAGNOSIS — R1031 Right lower quadrant pain: Secondary | ICD-10-CM | POA: Diagnosis not present

## 2017-05-29 DIAGNOSIS — Z7984 Long term (current) use of oral hypoglycemic drugs: Secondary | ICD-10-CM | POA: Diagnosis not present

## 2017-05-29 DIAGNOSIS — R110 Nausea: Secondary | ICD-10-CM

## 2017-05-29 DIAGNOSIS — Z7982 Long term (current) use of aspirin: Secondary | ICD-10-CM | POA: Insufficient documentation

## 2017-05-29 DIAGNOSIS — Z79899 Other long term (current) drug therapy: Secondary | ICD-10-CM | POA: Diagnosis not present

## 2017-05-29 DIAGNOSIS — N200 Calculus of kidney: Secondary | ICD-10-CM | POA: Diagnosis not present

## 2017-05-29 DIAGNOSIS — I10 Essential (primary) hypertension: Secondary | ICD-10-CM | POA: Diagnosis not present

## 2017-05-29 DIAGNOSIS — E119 Type 2 diabetes mellitus without complications: Secondary | ICD-10-CM | POA: Insufficient documentation

## 2017-05-29 DIAGNOSIS — R1110 Vomiting, unspecified: Secondary | ICD-10-CM | POA: Diagnosis not present

## 2017-05-29 LAB — URINALYSIS, ROUTINE W REFLEX MICROSCOPIC
Bilirubin Urine: NEGATIVE
GLUCOSE, UA: NEGATIVE mg/dL
Hgb urine dipstick: NEGATIVE
KETONES UR: NEGATIVE mg/dL
LEUKOCYTES UA: NEGATIVE
NITRITE: NEGATIVE
PROTEIN: NEGATIVE mg/dL
Specific Gravity, Urine: 1.026 (ref 1.005–1.030)
pH: 5 (ref 5.0–8.0)

## 2017-05-29 LAB — COMPREHENSIVE METABOLIC PANEL
ALT: 21 U/L (ref 17–63)
ANION GAP: 7 (ref 5–15)
AST: 18 U/L (ref 15–41)
Albumin: 4.5 g/dL (ref 3.5–5.0)
Alkaline Phosphatase: 54 U/L (ref 38–126)
BILIRUBIN TOTAL: 0.5 mg/dL (ref 0.3–1.2)
BUN: 21 mg/dL — ABNORMAL HIGH (ref 6–20)
CO2: 23 mmol/L (ref 22–32)
Calcium: 9.4 mg/dL (ref 8.9–10.3)
Chloride: 108 mmol/L (ref 101–111)
Creatinine, Ser: 1.51 mg/dL — ABNORMAL HIGH (ref 0.61–1.24)
GFR calc Af Amer: 55 mL/min — ABNORMAL LOW (ref >60)
GFR, EST NON AFRICAN AMERICAN: 47 mL/min — AB (ref >60)
Glucose, Bld: 207 mg/dL — ABNORMAL HIGH (ref 65–99)
POTASSIUM: 4.4 mmol/L (ref 3.5–5.1)
Sodium: 138 mmol/L (ref 135–145)
TOTAL PROTEIN: 7.1 g/dL (ref 6.5–8.1)

## 2017-05-29 LAB — CBC WITH DIFFERENTIAL/PLATELET
BASOS PCT: 0 %
Basophils Absolute: 0 10*3/uL (ref 0.0–0.1)
EOS ABS: 0.2 10*3/uL (ref 0.0–0.7)
Eosinophils Relative: 2 %
HEMATOCRIT: 42.3 % (ref 39.0–52.0)
HEMOGLOBIN: 13.5 g/dL (ref 13.0–17.0)
Lymphocytes Relative: 23 %
Lymphs Abs: 1.6 10*3/uL (ref 0.7–4.0)
MCH: 28.1 pg (ref 26.0–34.0)
MCHC: 31.9 g/dL (ref 30.0–36.0)
MCV: 87.9 fL (ref 78.0–100.0)
Monocytes Absolute: 0.4 10*3/uL (ref 0.1–1.0)
Monocytes Relative: 5 %
NEUTROS ABS: 4.9 10*3/uL (ref 1.7–7.7)
NEUTROS PCT: 70 %
Platelets: 232 10*3/uL (ref 150–400)
RBC: 4.81 MIL/uL (ref 4.22–5.81)
RDW: 14.7 % (ref 11.5–15.5)
WBC: 7 10*3/uL (ref 4.0–10.5)

## 2017-05-29 LAB — LIPASE, BLOOD: LIPASE: 37 U/L (ref 11–51)

## 2017-05-29 MED ORDER — ONDANSETRON HCL 4 MG/2ML IJ SOLN
4.0000 mg | Freq: Once | INTRAMUSCULAR | Status: AC
  Administered 2017-05-29: 4 mg via INTRAVENOUS
  Filled 2017-05-29: qty 2

## 2017-05-29 MED ORDER — OXYCODONE-ACETAMINOPHEN 5-325 MG PO TABS: 1.0000 | ORAL_TABLET | Freq: Three times a day (TID) | ORAL | 0 refills | Status: DC | PRN

## 2017-05-29 MED ORDER — DIPHENHYDRAMINE HCL 50 MG/ML IJ SOLN
25.0000 mg | Freq: Once | INTRAMUSCULAR | Status: AC
  Administered 2017-05-29: 25 mg via INTRAVENOUS
  Filled 2017-05-29: qty 1

## 2017-05-29 MED ORDER — HYDROMORPHONE HCL 1 MG/ML IJ SOLN
1.0000 mg | Freq: Once | INTRAMUSCULAR | Status: AC
  Administered 2017-05-29: 1 mg via INTRAVENOUS
  Filled 2017-05-29: qty 1

## 2017-05-29 MED ORDER — KETOROLAC TROMETHAMINE 30 MG/ML IJ SOLN
30.0000 mg | Freq: Once | INTRAMUSCULAR | Status: AC
  Administered 2017-05-29: 30 mg via INTRAVENOUS
  Filled 2017-05-29: qty 1

## 2017-05-29 MED ORDER — SODIUM CHLORIDE 0.9 % IV BOLUS (SEPSIS)
1000.0000 mL | Freq: Once | INTRAVENOUS | Status: AC
  Administered 2017-05-29: 1000 mL via INTRAVENOUS

## 2017-05-29 MED ORDER — IBUPROFEN 800 MG PO TABS: 800.0000 mg | ORAL_TABLET | Freq: Three times a day (TID) | ORAL | 0 refills | Status: DC

## 2017-05-29 MED ORDER — OXYCODONE-ACETAMINOPHEN 5-325 MG PO TABS
2.0000 | ORAL_TABLET | Freq: Once | ORAL | Status: AC
  Administered 2017-05-29: 2 via ORAL
  Filled 2017-05-29: qty 2

## 2017-05-29 MED ORDER — ONDANSETRON HCL 8 MG PO TABS: 8.0000 mg | ORAL_TABLET | ORAL | 0 refills | Status: DC | PRN

## 2017-05-29 MED ORDER — IOPAMIDOL (ISOVUE-300) INJECTION 61%
INTRAVENOUS | Status: AC
  Administered 2017-05-29: 75 mL
  Filled 2017-05-29: qty 75

## 2017-05-29 MED ORDER — TAMSULOSIN HCL 0.4 MG PO CAPS
0.4000 mg | ORAL_CAPSULE | Freq: Once | ORAL | Status: AC
  Administered 2017-05-29: 0.4 mg via ORAL
  Filled 2017-05-29: qty 1

## 2017-05-29 MED ORDER — MORPHINE SULFATE (PF) 4 MG/ML IV SOLN
4.0000 mg | Freq: Once | INTRAVENOUS | Status: AC
  Administered 2017-05-29: 4 mg via INTRAVENOUS
  Filled 2017-05-29: qty 1

## 2017-05-29 MED ORDER — TAMSULOSIN HCL 0.4 MG PO CAPS: 0.4000 mg | ORAL_CAPSULE | Freq: Every day | ORAL | 0 refills | Status: DC

## 2017-05-29 NOTE — ED Triage Notes (Signed)
Pt reports itching a IV site after Morphine dose.

## 2017-05-29 NOTE — ED Provider Notes (Signed)
[facility]-EMERGENCY DEPT Provider Note   CSN: [id number] Arrival date & time: 05/29/17  0818     History   Chief Complaint Chief Complaint  Patient presents with  . Back Pain  . Abdominal Pain  . Nausea  . Bradycardia    HPI Sean [name] is a 61 y.o. male.   Back Pain   Associated symptoms include abdominal pain.  Abdominal Pain   This is a recurrent problem. The current episode started 6 to 12 hours ago. The problem occurs constantly. The problem has been gradually worsening. The pain is located in the RLQ. The quality of the pain is sharp. The pain is mild. Nothing aggravates the symptoms. Nothing relieves the symptoms.    Past Medical History:  Diagnosis Date  . Calculus of gallbladder with acute cholecystitis 11/08/2014   Via CT AB 2015   . Diabetes mellitus without complication (HCC)   . Fatty liver disease, nonalcoholic 11/08/2014  . Gout 07/17/2014  . H/O cluster headache   . History of nephrolithiasis 11/08/2014  . 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 Vitamin D deficiency     Patient Active Problem List   Diagnosis Date Noted  . History of nephrolithiasis 11/08/2014  . Fatty liver disease, nonalcoholic 11/08/2014  . Calculus of gallbladder with acute cholecystitis 11/08/2014  . Mixed hyperlipidemia 07/17/2014  . Gout 07/17/2014  . HTN (hypertension) 07/17/2014  . Medication management 07/17/2014  . Vitamin D deficiency   . 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 H/O cluster headache     Past Surgical History:  Procedure Laterality Date  . NASAL SINUS SURGERY    . TOE SURGERY    . VARICOCELE EXCISION         Home Medications    Prior to Admission medications   Medication Sig Start Date End Date Taking? Authorizing Provider  allopurinol (ZYLOPRIM) 300 MG tablet Take 1 tablet daily to prevent Gout Patient taking differently: Take 300 mg by mouth daily. daily to prevent Gout 02/02/17 02/02/18 Yes Lucky Cowboy, MD  aspirin EC 81 MG tablet Take 81 mg by  mouth at bedtime.   Yes [provider]  atenolol (TENORMIN) 50 MG tablet Take 1 tablet daily to prevent Heart Arrhythmia. Patient taking differently: Take 50 mg by mouth daily. to prevent Heart Arrhythmia. 02/19/17 08/21/17 Yes Lucky Cowboy, MD  B Complex-C (SUPER B COMPLEX PO) Take 1 tablet by mouth daily.   Yes [provider]  fenofibrate micronized (LOFIBRA) 134 MG capsule TAKE 1 CAPSULE DAILY TO PREVENT GOUT 05/07/17  Yes Lucky Cowboy, MD  metFORMIN (GLUCOPHAGE-XR) 500 MG 24 hr tablet Take 2 tablets (1,000 mg total) by mouth 2 (two) times daily. 03/22/17  Yes Lucky Cowboy, MD  montelukast (SINGULAIR) 10 MG tablet Take 1 tablet daily for Allergies 03/22/17 09/22/17 Yes Lucky Cowboy, MD  Omega-3 Fatty Acids (FISH OIL) 1000 MG CAPS Take 1,000 mg by mouth 2 (two) times daily.    Yes [provider]  Vitamin D, Cholecalciferol, 1000 UNITS TABS Take 1,000 mg by mouth 2 (two) times daily.    Yes [provider]  ibuprofen (ADVIL,MOTRIN) 800 MG tablet Take 1 tablet (800 mg total) by mouth 3 (three) times daily. 05/29/17   Karrigan Messamore, Barbara Cower, MD  ondansetron (ZOFRAN) 8 MG tablet Take 1 tablet (8 mg total) by mouth every 4 (four) hours as needed for nausea. 05/29/17   Alika Saladin, Barbara Cower, MD  oxyCODONE-acetaminophen (PERCOCET) 5-325 MG tablet Take 1-2 tablets by mouth every  8 (eight) hours as needed. 05/29/17   Zriyah Kopplin, Barbara Cower, MD  tamsulosin (FLOMAX) 0.4 MG CAPS capsule Take 1 capsule (0.4 mg total) by mouth daily. 05/29/17   Densel Kronick, Barbara Cower, MD    Family History Family History  Problem Relation Age of Onset  . Hypertension Father   . Diabetes Father   . Heart attack Father 49    Social History Social History  Substance Use Topics  . Smoking status: Never Smoker  . Smokeless tobacco: Never Used  . Alcohol use No     Allergies   Levaquin [levofloxacin] and Valtrex [valacyclovir hcl]   Review of Systems Review of Systems  Gastrointestinal: Positive for  abdominal pain.  Musculoskeletal: Positive for back pain.  All other systems reviewed and are negative.    Physical Exam Updated Vital Signs BP (!) 104/56   Pulse (!) 50   Temp (!) 97.5 F (36.4 C) (Oral)   Resp 14   SpO2 94%   Physical Exam  Constitutional: He is oriented to person, place, and time. He appears well-developed and well-nourished.  HENT:  Head: Normocephalic and atraumatic.  Eyes: Conjunctivae and EOM are normal.  Neck: Normal range of motion.  Cardiovascular: Normal rate.   Pulmonary/Chest: Effort normal. No respiratory distress.  Abdominal: Soft. He exhibits no distension.  Musculoskeletal: Normal range of motion. He exhibits no edema or deformity.  Neurological: He is alert and oriented to person, place, and time. No cranial nerve deficit. Coordination normal.  Skin: Skin is warm and dry.  Nursing note and vitals reviewed.    ED Treatments / Results  Labs (all labs ordered are listed, but only abnormal results are displayed) Labs Reviewed  COMPREHENSIVE METABOLIC PANEL - Abnormal; Notable for the following:       Result Value   Glucose, Bld 207 (*)    BUN 21 (*)    Creatinine, Ser 1.51 (*)    GFR calc non Af Amer 47 (*)    GFR calc Af Amer 55 (*)    All other components within normal limits  CBC WITH DIFFERENTIAL/PLATELET  LIPASE, BLOOD  URINALYSIS, ROUTINE W REFLEX MICROSCOPIC    EKG  EKG Interpretation  Date/Time:  Saturday May 29 2017 09:05:48 EDT Ventricular Rate:  69 PR Interval:  190 QRS Duration: 106 QT Interval:  434 QTC Calculation: 465 R Axis:   5 Text Interpretation:  Sinus rhythm with frequent Premature ventricular complexes in a pattern of bigeminy Otherwise normal ECG No old tracing to compare Confirmed by Marily Memos [phone]) on 05/29/2017 10:42:17 AM       Radiology Ct Abdomen Pelvis W Contrast  Result Date: 05/29/2017 CLINICAL DATA:  61 year old male with a history of sudden onset left lower quadrant pain with  nausea and vomiting EXAM: CT ABDOMEN AND PELVIS WITH CONTRAST TECHNIQUE: Multidetector CT imaging of the abdomen and pelvis was performed using the standard protocol following bolus administration of intravenous contrast. CONTRAST:  75mL ISOVUE-300 IOPAMIDOL (ISOVUE-300) INJECTION 61% COMPARISON:  CT 10/27/2011 FINDINGS: Lower chest: Atelectasis of the lung bases. Hepatobiliary: Decreased attenuation of liver parenchyma. Cranial caudal span of the right liver measures 22 cm. Cholelithiasis without evidence of inflammatory changes. 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 Unremarkable right adrenal gland. Mild right-sided pelvicaliectasis. Nonobstructive stones in the right collecting system measuring 3 mm or less. Mild dilation along the length of the right ureter with associated inflammatory changes. 2 mm -3 mm stone at the  right ureterovesical junction. Left kidney unremarkable with no hydronephrosis. No nephrolithiasis. Unremarkable course of the left ureter. Urinary bladder partially distended. Stomach/Bowel: Unremarkable stomach, small bowel without abnormal distention. No transition point. Normal appendix. Mild stool burden. Minimal diverticular change without associated inflammation. Vascular/Lymphatic: Scattered calcifications of the abdominal aorta. No aneurysm. Mesenteric and renal vessels are patent. Iliac and proximal femoral vessels are patent. Reproductive: Unremarkable. Other: Small fat containing umbilical hernia Musculoskeletal: No displaced fracture. Minimal degenerative changes of the spine. IMPRESSION: Small obstructing right ureteral stone at the ureterovesical junction. There is mild right-sided pelvicaliectasis. If there is concern for urinary tract infection, recommend correlation with urinalysis. Additional nonobstructive stones within the right collecting system.  Cholelithiasis without evidence of acute cholecystitis. Liver steatosis. Electronically Signed   By: Gilmer Mor D.O.   On: 05/29/2017 12:14    Procedures Procedures (including critical care time)  Medications Ordered in ED Medications  morphine 4 MG/ML injection 4 mg (4 mg Intravenous Given 05/29/17 0959)  ondansetron (ZOFRAN) injection 4 mg (4 mg Intravenous Given 05/29/17 1020)  sodium chloride 0.9 % bolus 1,000 mL (0 mLs Intravenous Stopped 05/29/17 1213)  diphenhydrAMINE (BENADRYL) injection 25 mg (25 mg Intravenous Given 05/29/17 1018)  iopamidol (ISOVUE-300) 61 % injection (75 mLs  Contrast Given 05/29/17 1120)  HYDROmorphone (DILAUDID) injection 1 mg (1 mg Intravenous Given 05/29/17 1222)  ketorolac (TORADOL) 30 MG/ML injection 30 mg (30 mg Intravenous Given 05/29/17 1222)  tamsulosin (FLOMAX) capsule 0.4 mg (0.4 mg Oral Given 05/29/17 1223)  oxyCODONE-acetaminophen (PERCOCET/ROXICET) 5-325 MG per tablet 2 tablet (2 tablets Oral Given 05/29/17 1541)     Initial Impression / Assessment and Plan / ED Course  I have reviewed the triage vital signs and the nursing notes.  Pertinent labs & imaging results that were available during my care of the patient were reviewed by me and considered in my medical decision making (see chart for details).     Kidney stone as likely cause for symptoms. Significantly improved with meds here. Will follow up with urologist.   Final Clinical Impressions(s) / ED Diagnoses   Final diagnoses:  Nausea  Kidney stone    New Prescriptions Discharge Medication List as of 05/29/2017  3:13 PM    START taking these medications   Details  ondansetron (ZOFRAN) 8 MG tablet Take 1 tablet (8 mg total) by mouth every 4 (four) hours as needed for nausea., Starting Sat 05/29/2017, Print    oxyCODONE-acetaminophen (PERCOCET) 5-325 MG tablet Take 1-2 tablets by mouth every 8 (eight) hours as needed., Starting Sat 05/29/2017, Print    tamsulosin (FLOMAX) 0.4 MG CAPS  capsule Take 1 capsule (0.4 mg total) by mouth daily., Starting Sat 05/29/2017, Print         Felecia Stanfill, Barbara Cower, MD 05/29/17 [phone]

## 2017-05-29 NOTE — ED Triage Notes (Signed)
Pt. Stated, I started having back pain,and rt. Side pain with some nausea.

## 2017-08-18 ENCOUNTER — Other Ambulatory Visit: Payer: Self-pay | Admitting: Internal Medicine

## 2017-08-18 DIAGNOSIS — I472 Ventricular tachycardia, unspecified: Secondary | ICD-10-CM

## 2017-08-19 ENCOUNTER — Ambulatory Visit: Payer: Self-pay | Admitting: Internal Medicine

## 2017-08-25 ENCOUNTER — Other Ambulatory Visit: Payer: Self-pay | Admitting: Internal Medicine

## 2017-08-25 DIAGNOSIS — E1121 Type 2 diabetes mellitus with diabetic nephropathy: Secondary | ICD-10-CM

## 2017-08-25 DIAGNOSIS — N182 Chronic kidney disease, stage 2 (mild): Secondary | ICD-10-CM

## 2017-08-25 DIAGNOSIS — E1122 Type 2 diabetes mellitus with diabetic chronic kidney disease: Secondary | ICD-10-CM

## 2017-09-13 ENCOUNTER — Ambulatory Visit (INDEPENDENT_AMBULATORY_CARE_PROVIDER_SITE_OTHER): Payer: BLUE CROSS/BLUE SHIELD | Admitting: Internal Medicine

## 2017-09-13 ENCOUNTER — Encounter: Payer: Self-pay | Admitting: Internal Medicine

## 2017-09-13 VITALS — BP 116/74 | HR 52 | Temp 97.5°F | Resp 16 | Ht 75.5 in | Wt 228.6 lb

## 2017-09-13 DIAGNOSIS — E782 Mixed hyperlipidemia: Secondary | ICD-10-CM | POA: Diagnosis not present

## 2017-09-13 DIAGNOSIS — E559 Vitamin D deficiency, unspecified: Secondary | ICD-10-CM | POA: Diagnosis not present

## 2017-09-13 DIAGNOSIS — M1000 Idiopathic gout, unspecified site: Secondary | ICD-10-CM

## 2017-09-13 DIAGNOSIS — I10 Essential (primary) hypertension: Secondary | ICD-10-CM

## 2017-09-13 DIAGNOSIS — Z79899 Other long term (current) drug therapy: Secondary | ICD-10-CM

## 2017-09-13 DIAGNOSIS — J0140 Acute pansinusitis, unspecified: Secondary | ICD-10-CM | POA: Diagnosis not present

## 2017-09-13 DIAGNOSIS — E1122 Type 2 diabetes mellitus with diabetic chronic kidney disease: Secondary | ICD-10-CM

## 2017-09-13 DIAGNOSIS — N182 Chronic kidney disease, stage 2 (mild): Secondary | ICD-10-CM | POA: Diagnosis not present

## 2017-09-13 MED ORDER — AZITHROMYCIN 250 MG PO TABS: ORAL_TABLET | ORAL | 1 refills | Status: DC

## 2017-09-13 MED ORDER — PREDNISONE 10 MG PO TABS: ORAL_TABLET | ORAL | 0 refills | Status: DC

## 2017-09-13 NOTE — Progress Notes (Signed)
This very nice 62 y.o. presents for 3 month follow up with Hypertension, Hyperlipidemia, Pre-Diabetes and Vitamin D Deficiency. Patient is c/o 3 week hx/o sinus congestion and pressure /pain about the Rt orbit and occasional greenish nasal drainage. Patient has hx/o Gout predating since 2007 and is apparently controlled on Allopurinol.      Patient is treated for labile HTN and has hx/o pSVT. BP has been controlled at home. Today's BP is at goal - 116/74. Patient has been evaluated & followed by Dr Sanjuana Kava for pSVT. Patient has had no complaints of any cardiac type chest pain, palpitations, dyspnea / orthopnea / PND, dizziness, claudication, or dependent edema.     Hyperlipidemia is controlled with diet & meds. Patient denies myalgias or other med SE's. Last Lipids were  Lab Results  Component Value Date   CHOL 110 05/20/2017   HDL 33 (L) 05/20/2017   LDLCALC 74 02/02/2017   TRIG 106 05/20/2017   CHOLHDL 3.3 05/20/2017      Also, the patient has history of T2_NIDDM (2013) and has had no symptoms of reactive hypoglycemia, diabetic polys, paresthesias or visual blurring.  He alleges CBG's are less than 120's mg%. Last A1c was not at goal: Lab Results  Component Value Date   HGBA1C 6.5 (H) 05/20/2017      Further, the patient also has history of Vitamin D Deficiency ("34"/2012) and supplements vitamin D without any suspected side-effects. Last vitamin D was at goal: Lab Results  Component Value Date   VD25OH 62 02/02/2017   Current Outpatient Medications on File Prior to Visit  Medication Sig  . allopurinol (ZYLOPRIM) 300 MG tablet Take 1 tablet daily to prevent Gout (Patient taking differently: Take 300 mg by mouth daily. daily to prevent Gout)  . aspirin EC 81 MG tablet Take 81 mg by mouth at bedtime.  Marland Kitchen atenolol (TENORMIN) 50 MG tablet TAKE 1 TABLET DAILY TO PREVENT HEART ARRHYTHMIA.  . B Complex-C (SUPER B COMPLEX PO) Take 1 tablet by mouth daily.  . fenofibrate micronized  (LOFIBRA) 134 MG capsule TAKE 1 CAPSULE DAILY TO PREVENT GOUT  . metFORMIN (GLUCOPHAGE-XR) 500 MG 24 hr tablet TAKE 2 TABLETS TWICE A DAY  . montelukast (SINGULAIR) 10 MG tablet Take 1 tablet daily for Allergies  . Omega-3 Fatty Acids (FISH OIL) 1000 MG CAPS Take 1,000 mg by mouth 2 (two) times daily.   Marland Kitchen OVER THE COUNTER MEDICATION Takes OTC Advil PRN for headache  . Vitamin D, Cholecalciferol, 1000 UNITS TABS Take 1,000 mg by mouth 2 (two) times daily.    No current facility-administered medications on file prior to visit.    Allergies  Allergen Reactions  . Levaquin [Levofloxacin]     GI Upset  . Valtrex [Valacyclovir Hcl] Hives   PMHx:   Past Medical History:  Diagnosis Date  . Calculus of gallbladder with acute cholecystitis 11/08/2014   Via CT AB 2015   . Diabetes mellitus without complication (HCC)   . Fatty liver disease, nonalcoholic 11/08/2014  . Gout 07/17/2014  . H/O cluster headache   . History of nephrolithiasis 11/08/2014  . 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 Vitamin D deficiency    Immunization History  Administered Date(s) Administered  . Influenza Inj Mdck Quad With Preservative 05/20/2017  . Influenza Split 05/15/2013, 07/17/2014, 06/18/2015  . Influenza, Seasonal, Injecte, Preservative Fre 09/08/2016  . PPD Test 07/17/2014, 09/30/2015, 02/02/2017  . Pneumococcal Polysaccharide-23 02/07/2016  . Tdap 09/30/2015   Past  Surgical History:  Procedure Laterality Date  . NASAL SINUS SURGERY    . TOE SURGERY    . VARICOCELE EXCISION     FHx:    Reviewed / unchanged  SHx:    Reviewed / unchanged   Systems Review:  Constitutional: Denies fever, chills, wt changes, headaches, insomnia, fatigue, night sweats, change in appetite. Eyes: Denies redness, blurred vision, diplopia, discharge, itchy, watery eyes.  ENT: Denies discharge, congestion, post nasal drip, epistaxis, sore throat, earache, hearing loss, dental pain, tinnitus, vertigo, sinus pain, snoring.    CV: Denies chest pain, palpitations, irregular heartbeat, syncope, dyspnea, diaphoresis, orthopnea, PND, claudication or edema. Respiratory: denies cough, dyspnea, DOE, pleurisy, hoarseness, laryngitis, wheezing.  Gastrointestinal: Denies dysphagia, odynophagia, heartburn, reflux, water brash, abdominal pain or cramps, nausea, vomiting, bloating, diarrhea, constipation, hematemesis, melena, hematochezia  or hemorrhoids. Genitourinary: Denies dysuria, frequency, urgency, nocturia, hesitancy, discharge, hematuria or flank pain. Musculoskeletal: Denies arthralgias, myalgias, stiffness, jt. swelling, pain, limping or strain/sprain.  Skin: Denies pruritus, rash, hives, warts, acne, eczema or change in skin lesion(s). Neuro: No weakness, tremor, incoordination, spasms, paresthesia or pain. Psychiatric: Denies confusion, memory loss or sensory loss. Endo: Denies change in weight, skin or hair change.  Heme/Lymph: No excessive bleeding, bruising or enlarged lymph nodes.  Physical Exam  BP 116/74   Pulse (!) 52   Temp (!) 97.5 F (36.4 C)   Resp 16   Ht 6' 3.5" (1.918 m)   Wt 228 lb 9.6 oz (103.7 kg)   BMI 28.20 kg/m   Appears well nourished, well groomed  and in no distress.  Eyes: PERRLA, EOMs, conjunctiva no swelling or erythema. Sinuses: (+) Right frontal/maxillary tenderness ENT/Mouth: EAC's clear, TM's nl w/o erythema, bulging. Nares clear w/o erythema, swelling, exudates. Oropharynx clear without erythema or exudates. Oral hygiene is good. Tongue normal, non obstructing. Hearing intact.  Neck: Supple. Thyroid nl. Car 2+/2+ without bruits, nodes or JVD. Chest: Respirations nl with BS clear & equal w/o rales, rhonchi, wheezing or stridor.  Cor: Heart sounds normal w/ regular rate and rhythm without sig. murmurs, gallops, clicks or rubs. Peripheral pulses normal and equal  without edema.  Abdomen: Soft & bowel sounds normal. Non-tender w/o guarding, rebound, hernias, masses or  organomegaly.  Lymphatics: Unremarkable.  Musculoskeletal: Full ROM all peripheral extremities, joint stability, 5/5 strength and normal gait.  Skin: Warm, dry without exposed rashes, lesions or ecchymosis apparent.  Neuro: Cranial nerves intact, reflexes equal bilaterally. Sensory-motor testing grossly intact. Tendon reflexes grossly intact.  Pysch: Alert & oriented x 3.  Insight and judgement nl & appropriate. No ideations.  Assessment and Plan:  1. Essential hypertension  - Continue medication, monitor blood pressure at home.  - Continue DASH diet. Reminder to go to the ER if any CP,  SOB, nausea, dizziness, severe HA, changes vision/speech.  - CBC with Differential/Platelet - BASIC METABOLIC PANEL WITH GFR - Magnesium - TSH  2. Hyperlipidemia, mixed  - Continue diet/meds, exercise,& lifestyle modifications.  - Continue monitor periodic cholesterol/liver & renal functions   - Hepatic function panel - Lipid panel - TSH  3. Type 2 diabetes mellitus with stage 2 chronic kidney disease, without long-term current use of insulin (HCC)  - Continue diet, exercise, lifestyle modifications.  - Monitor appropriate labs.  - Hemoglobin A1c - Insulin, random  4. Vitamin D deficiency  - Continue supplementation.  - VITAMIN D 25 Hydroxy   5. Idiopathic gout  - Uric acid  6. Subacute pansinusitis  - predniSONE (DELTASONE) 10 MG  tablet; 1 tab 3 x day for 3 days, then 1 tab 2 x day for 3 days, then 1 tab 1 x day for 5 days  Dispense: 20 tablet; Refill: 0  - azithromycin (ZITHROMAX) 250 MG tablet; Take 2 tablets (500 mg) on  Day 1,  followed by 1 tablet (250 mg) once daily on Days 2 through 5.  Dispense: 6 each; Refill: 1  7. Medication management  - CBC with Differential/Platelet - BASIC METABOLIC PANEL WITH GFR - Hepatic function panel - Magnesium - Lipid panel - TSH - Hemoglobin A1c - Insulin, random - VITAMIN D 25 Hydroxyl - Uric acid       Discussed  regular  exercise, BP monitoring, weight control to achieve/maintain BMI less than 25 and discussed med and SE's. Recommended labs to assess and monitor clinical status with further disposition pending results of labs. Over 30 minutes of exam, counseling, chart review was performed.

## 2017-09-13 NOTE — Patient Instructions (Addendum)
Gout  Gout is painful swelling that can occur in some of your joints. Gout is a type of arthritis. This condition is caused by having too much uric acid in your body. Uric acid is a chemical that forms when your body breaks down substances called purines. Purines are important for building body proteins. When your body has too much uric acid, sharp crystals can form and build up inside your joints. This causes pain and swelling. Gout attacks can happen quickly and be very painful (acute gout). Over time, the attacks can affect more joints and become more frequent (chronic gout). Gout can also cause uric acid to build up under your skin and inside your kidneys.  What are the causes?  This condition is caused by too much uric acid in your blood. This can occur because:  Your kidneys do not remove enough uric acid from your blood. This is the most common cause.  Your body makes too much uric acid. This can occur with some cancers and cancer treatments. It can also occur if your body is breaking down too many red blood cells (hemolytic anemia).  You eat too many foods that are high in purines. These foods include organ meats and some seafood. Alcohol, especially beer, is also high in purines.  A gout attack may be triggered by trauma or stress.  What increases the risk?  This condition is more likely to develop in people who:  Have a family history of gout.  Are male and middle-aged.  Are male and have gone through menopause.  Are obese.  Frequently drink alcohol, especially beer.  Are dehydrated.  Lose weight too quickly.  Have an organ transplant.  Have lead poisoning.  Take certain medicines, including aspirin, cyclosporine, diuretics, levodopa, and niacin.  Have kidney disease or psoriasis.  What are the signs or symptoms?  An attack of acute gout happens quickly. It usually occurs in just one joint. The most common place is the big toe. Attacks often start at night.  Other joints that may be affected include joints of the feet, ankle, knee, fingers, wrist, or elbow. Symptoms may include:  Severe pain.  Warmth.  Swelling.  Stiffness.  Tenderness. The affected joint may be very painful to touch.  Shiny, red, or purple skin.  Chills and fever.  Chronic gout may cause symptoms more frequently. More joints may be involved. You may also have white or yellow lumps (tophi) on your hands or feet or in other areas near your joints.  How is this diagnosed?  This condition is diagnosed based on your symptoms, medical history, and physical exam. You may have tests, such as:  Blood tests to measure uric acid levels.  Removal of joint fluid with a needle (aspiration) to look for uric acid crystals.  X-rays to look for joint damage.  How is this treated?  Treatment for this condition has two phases: treating an acute attack and preventing future attacks. Acute gout treatment may include medicines to reduce pain and swelling, including:  NSAIDs.  Steroids. These are strong anti-inflammatory medicines that can be taken by mouth (orally) or injected into a joint.  Colchicine. This medicine relieves pain and swelling when it is taken soon after an attack. It can be given orally or through an IV tube.  Preventive treatment may include:  Daily use of smaller doses of NSAIDs or colchicine.  Use of a medicine that reduces uric acid levels in your blood.  Changes to your diet. You may  need to see a specialist about healthy eating (dietitian).  Follow these instructions at home: During a Gout Attack   If directed, apply ice to the affected area: ? Put ice in a plastic bag. ? Place a towel between your skin and the bag. ? Leave the ice on for 20 minutes, 2-3 times a day.  Rest the joint as much as possible. If the affected joint is in your leg, you may be given crutches to use.  Raise (elevate) the affected joint above the level of your heart as  often as possible.  Drink enough fluids to keep your urine clear or pale yellow.  Take over-the-counter and prescription medicines only as told by your health care provider.  Do not drive or operate heavy machinery while taking prescription pain medicine.  Follow instructions from your health care provider about eating or drinking restrictions.  Return to your normal activities as told by your health care provider. Ask your health care provider what activities are safe for you.   Avoiding Future Gout Attacks   Follow a low-purine diet as told by your dietitian or health care provider. Avoid foods and drinks that are high in purines, including liver, kidney, anchovies, asparagus, herring, mushrooms, mussels, and beer.  Limit alcohol intake to no more than 1 drink a day for nonpregnant women and 2 drinks a day for men. One drink equals 12 oz of beer, 5 oz of wine, or 1 oz of hard liquor.  Maintain a healthy weight or lose weight if you are overweight. If you want to lose weight, talk with your health care provider. It is important that you do not lose weight too quickly.  Start or maintain an exercise program as told by your health care provider.  Drink enough fluids to keep your urine clear or pale yellow.  Take over-the-counter and prescription medicines only as told by your health care provider.  Keep all follow-up visits as told by your health care provider. This is important.   Contact a health care provider if:   You have another gout attack.  You continue to have symptoms of a gout attack after10 days of treatment.  You have side effects from your medicines.  You have chills or a fever.  You have burning pain when you urinate.  You have pain in your lower back or belly  .  Get help right away if:    You have severe or uncontrolled pain.  You cannot urinate.   +++++++++++++++++++++++++++++ Recommend Adult Low Dose Aspirin or  coated  Aspirin 81 mg daily   To reduce risk of Colon Cancer 20 %,  Skin Cancer 26 % ,  Melanoma 46%  and  Pancreatic cancer 60% +++++++++++++++++++++++++ Vitamin D goal  is between 70-100.  Please make sure that you are taking your Vitamin D as directed.  It is very important as a natural anti-inflammatory  helping hair, skin, and nails, as well as reducing stroke and heart attack risk.  It helps your bones and helps with mood. It also decreases numerous cancer risks so please take it as directed.  Low Vit D is associated with a 200-300% higher risk for CANCER  and 200-300% higher risk for HEART   ATTACK  &  STROKE.   .....................................Marland Kitchen It is also associated with higher death rate at younger ages,  autoimmune diseases like Rheumatoid arthritis, Lupus, Multiple Sclerosis.    Also many other serious conditions, like depression, Alzheimer's Dementia, infertility, muscle aches, fatigue, fibromyalgia -  just to name a few. ++++++++++++++++++++ Recommend the book "The END of DIETING" by Dr Monico Hoar  & the book "The END of DIABETES " by Dr Monico Hoar At [email or web address] - get book & Audio CD's    Being diabetic has a  300% increased risk for heart attack, stroke, cancer, and alzheimer- type vascular dementia. It is very important that you work harder with diet by avoiding all foods that are white. Avoid white rice (brown & wild rice is OK), white potatoes (sweetpotatoes in moderation is OK), White bread or wheat bread or anything made out of white flour like bagels, donuts, rolls, buns, biscuits, cakes, pastries, cookies, pizza crust, and pasta (made from white flour & egg whites) - vegetarian pasta or spinach or wheat pasta is OK. Multigrain breads like Arnold's or [employer], or multigrain sandwich thins or flatbreads.  Diet, exercise and weight loss can reverse and cure diabetes in the early stages.  Diet, exercise and weight loss is very important in the control and prevention of complications of  diabetes which affects every system in your body, ie. Brain - dementia/stroke, eyes - glaucoma/blindness, heart - heart attack/heart failure, kidneys - dialysis, stomach - gastric paralysis, intestines - malabsorption, nerves - severe painful neuritis, circulation - gangrene & loss of a leg(s), and finally cancer and Alzheimers.    I recommend avoid fried & greasy foods,  sweets/candy, white rice (brown or wild rice or Quinoa is OK), white potatoes (sweet potatoes are OK) - anything made from white flour - bagels, doughnuts, rolls, buns, biscuits,white and wheat breads, pizza crust and traditional pasta made of white flour & egg white(vegetarian pasta or spinach or wheat pasta is OK).  Multi-grain bread is OK - like multi-grain flat bread or sandwich thins. Avoid alcohol in excess. Exercise is also important.    Eat all the vegetables you want - avoid meat, especially red meat and dairy - especially cheese.  Cheese is the most concentrated form of trans-fats which is the worst thing to clog up our arteries. Veggie cheese is OK which can be found in the fresh produce section at [employer] or [employer] or [employer]  +++++++++++++++++++++ DASH Eating Plan  DASH stands for "Dietary Approaches to Stop Hypertension."   The DASH eating plan is a healthy eating plan that has been shown to reduce high blood pressure (hypertension). Additional health benefits may include reducing the risk of type 2 diabetes mellitus, heart disease, and stroke. The DASH eating plan may also help with weight loss. WHAT DO I NEED TO KNOW ABOUT THE DASH EATING PLAN? For the DASH eating plan, you will follow these general guidelines:  Choose foods with a percent daily value for sodium of less than 5% (as listed on the food label).  Use salt-free seasonings or herbs instead of table salt or sea salt.  Check with your health care provider or pharmacist before using salt substitutes.  Eat lower-sodium products, often labeled  as "lower sodium" or "no salt added."  Eat fresh foods.  Eat more vegetables, fruits, and low-fat dairy products.  Choose whole grains. Look for the word "whole" as the first word in the ingredient list.  Choose fish   Limit sweets, desserts, sugars, and sugary drinks.  Choose heart-healthy fats.  Eat veggie cheese   Eat more home-cooked food and less restaurant, buffet, and fast food.  Limit fried foods.  Cook foods using methods other than frying.  Limit canned vegetables. If you do use them, rinse  them well to decrease the sodium.  When eating at a restaurant, ask that your food be prepared with less salt, or no salt if possible.                      WHAT FOODS CAN I EAT? Read Dr Francis DowseJoel Fuhrman's books on The End of Dieting & The End of Diabetes  Grains Whole grain or whole wheat bread. Brown rice. Whole grain or whole wheat pasta. Quinoa, bulgur, and whole grain cereals. Low-sodium cereals. Corn or whole wheat flour tortillas. Whole grain cornbread. Whole grain crackers. Low-sodium crackers.  Vegetables Fresh or frozen vegetables (raw, steamed, roasted, or grilled). Low-sodium or reduced-sodium tomato and vegetable juices. Low-sodium or reduced-sodium tomato sauce and paste. Low-sodium or reduced-sodium canned vegetables.   Fruits All fresh, canned (in natural juice), or frozen fruits.  Protein Products  All fish and seafood.  Dried beans, peas, or lentils. Unsalted nuts and seeds. Unsalted canned beans.  Dairy Low-fat dairy products, such as skim or 1% milk, 2% or reduced-fat cheeses, low-fat ricotta or cottage cheese, or plain low-fat yogurt. Low-sodium or reduced-sodium cheeses.  Fats and Oils Tub margarines without trans fats. Light or reduced-fat mayonnaise and salad dressings (reduced sodium). Avocado. Safflower, olive, or canola oils. Natural peanut or almond butter.  Other Unsalted popcorn and pretzels. The items listed above may not be a complete list of  recommended foods or beverages. Contact your dietitian for more options.  +++++++++++++++  WHAT FOODS ARE NOT RECOMMENDED? Grains/ White flour or wheat flour White bread. White pasta. White rice. Refined cornbread. Bagels and croissants. Crackers that contain trans fat.  Vegetables  Creamed or fried vegetables. Vegetables in a . Regular canned vegetables. Regular canned tomato sauce and paste. Regular tomato and vegetable juices.  Fruits Dried fruits. Canned fruit in light or heavy syrup. Fruit juice.  Meat and Other Protein Products Meat in general - RED meat & White meat.  Fatty cuts of meat. Ribs, chicken wings, all processed meats as bacon, sausage, bologna, salami, fatback, hot dogs, bratwurst and packaged luncheon meats.  Dairy Whole or 2% milk, cream, half-and-half, and cream cheese. Whole-fat or sweetened yogurt. Full-fat cheeses or blue cheese. Non-dairy creamers and whipped toppings. Processed cheese, cheese spreads, or cheese curds.  Condiments Onion and garlic salt, seasoned salt, table salt, and sea salt. Canned and packaged gravies. Worcestershire sauce. Tartar sauce. Barbecue sauce. Teriyaki sauce. Soy sauce, including reduced sodium. Steak sauce. Fish sauce. Oyster sauce. Cocktail sauce. Horseradish. Ketchup and mustard. Meat flavorings and tenderizers. Bouillon cubes. Hot sauce. Tabasco sauce. Marinades. Taco seasonings. Relishes.  Fats and Oils Butter, stick margarine, lard, shortening and bacon fat. Coconut, palm kernel, or palm oils. Regular salad dressings.  Pickles and olives. Salted popcorn and pretzels.  The items listed above may not be a complete list of foods and beverages to avoid.

## 2017-09-14 LAB — CBC WITH DIFFERENTIAL/PLATELET
Basophils Absolute: 29 cells/uL (ref 0–200)
Basophils Relative: 0.4 %
EOS PCT: 4.3 %
Eosinophils Absolute: 310 cells/uL (ref 15–500)
HCT: 41.1 % (ref 38.5–50.0)
HEMOGLOBIN: 13.4 g/dL (ref 13.2–17.1)
LYMPHS ABS: 1865 {cells}/uL (ref 850–3900)
MCH: 28.6 pg (ref 27.0–33.0)
MCHC: 32.6 g/dL (ref 32.0–36.0)
MCV: 87.6 fL (ref 80.0–100.0)
MPV: 11.7 fL (ref 7.5–12.5)
Monocytes Relative: 6 %
NEUTROS ABS: 4565 {cells}/uL (ref 1500–7800)
Neutrophils Relative %: 63.4 %
Platelets: 275 10*3/uL (ref 140–400)
RBC: 4.69 10*6/uL (ref 4.20–5.80)
RDW: 13.2 % (ref 11.0–15.0)
Total Lymphocyte: 25.9 %
WBC mixed population: 432 cells/uL (ref 200–950)
WBC: 7.2 10*3/uL (ref 3.8–10.8)

## 2017-09-14 LAB — HEPATIC FUNCTION PANEL
AG RATIO: 1.9 (calc) (ref 1.0–2.5)
ALBUMIN MSPROF: 4.7 g/dL (ref 3.6–5.1)
ALT: 19 U/L (ref 9–46)
AST: 13 U/L (ref 10–35)
Alkaline phosphatase (APISO): 55 U/L (ref 40–115)
BILIRUBIN TOTAL: 0.4 mg/dL (ref 0.2–1.2)
Bilirubin, Direct: 0.1 mg/dL (ref 0.0–0.2)
Globulin: 2.5 g/dL (calc) (ref 1.9–3.7)
Indirect Bilirubin: 0.3 mg/dL (calc) (ref 0.2–1.2)
TOTAL PROTEIN: 7.2 g/dL (ref 6.1–8.1)

## 2017-09-14 LAB — BASIC METABOLIC PANEL WITH GFR
BUN: 22 mg/dL (ref 7–25)
CALCIUM: 10 mg/dL (ref 8.6–10.3)
CO2: 27 mmol/L (ref 20–32)
CREATININE: 1.22 mg/dL (ref 0.70–1.25)
Chloride: 109 mmol/L (ref 98–110)
GFR, Est African American: 73 mL/min/{1.73_m2} (ref >=60)
GFR, Est Non African American: 63 mL/min/{1.73_m2} (ref >=60)
GLUCOSE: 151 mg/dL — AB (ref 65–99)
POTASSIUM: 5.1 mmol/L (ref 3.5–5.3)
Sodium: 140 mmol/L (ref 135–146)

## 2017-09-14 LAB — VITAMIN D 25 HYDROXY (VIT D DEFICIENCY, FRACTURES): Vit D, 25-Hydroxy: 58 ng/mL (ref 30–100)

## 2017-09-14 LAB — HEMOGLOBIN A1C
Hgb A1c MFr Bld: 7.4 % of total Hgb — ABNORMAL HIGH (ref <5.7)
Mean Plasma Glucose: 166 (calc)
eAG (mmol/L): 9.2 (calc)

## 2017-09-14 LAB — LIPID PANEL
CHOLESTEROL: 133 mg/dL (ref <200)
HDL: 33 mg/dL — AB (ref >40)
LDL Cholesterol (Calc): 74 mg/dL (calc)
NON-HDL CHOLESTEROL (CALC): 100 mg/dL (ref <130)
TRIGLYCERIDES: 159 mg/dL — AB (ref <150)
Total CHOL/HDL Ratio: 4 (calc) (ref <5.0)

## 2017-09-14 LAB — MAGNESIUM: Magnesium: 1.8 mg/dL (ref 1.5–2.5)

## 2017-09-14 LAB — TSH: TSH: 1.5 mIU/L (ref 0.40–4.50)

## 2017-09-14 LAB — INSULIN, RANDOM: Insulin: 14.8 u[IU]/mL (ref 2.0–19.6)

## 2017-09-14 LAB — URIC ACID: Uric Acid, Serum: 5.1 mg/dL (ref 4.0–8.0)

## 2017-10-07 ENCOUNTER — Other Ambulatory Visit: Payer: Self-pay | Admitting: Internal Medicine

## 2017-10-07 DIAGNOSIS — E782 Mixed hyperlipidemia: Secondary | ICD-10-CM

## 2017-11-25 ENCOUNTER — Other Ambulatory Visit: Payer: Self-pay | Admitting: Internal Medicine

## 2017-11-25 DIAGNOSIS — E1121 Type 2 diabetes mellitus with diabetic nephropathy: Secondary | ICD-10-CM

## 2017-11-25 DIAGNOSIS — N182 Chronic kidney disease, stage 2 (mild): Secondary | ICD-10-CM

## 2017-11-25 DIAGNOSIS — E1122 Type 2 diabetes mellitus with diabetic chronic kidney disease: Secondary | ICD-10-CM

## 2017-12-24 ENCOUNTER — Ambulatory Visit: Payer: Self-pay | Admitting: Adult Health

## 2018-01-05 DIAGNOSIS — E663 Overweight: Secondary | ICD-10-CM | POA: Insufficient documentation

## 2018-01-05 DIAGNOSIS — N182 Chronic kidney disease, stage 2 (mild): Secondary | ICD-10-CM

## 2018-01-05 DIAGNOSIS — E1122 Type 2 diabetes mellitus with diabetic chronic kidney disease: Secondary | ICD-10-CM | POA: Insufficient documentation

## 2018-01-05 NOTE — Progress Notes (Signed)
FOLLOW UP  Assessment and Plan:   Hypertension Well controlled with current medications  Monitor blood pressure at home; patient to call if consistently greater than 130/80 Continue DASH diet.   Reminder to go to the ER if any CP, SOB, nausea, dizziness, severe HA, changes vision/speech, left arm numbness and tingling and jaw pain.  Cholesterol Currently very nearly at goal; diet discussed  Continue low cholesterol diet and exercise.  Check lipid panel.   Diabetes with diabetic chronic kidney disease Continue medication: metformin Continue diet and exercise.  Perform daily foot/skin check, notify office of any concerning changes.  Check A1C  Overweight with co morbidities Long discussion about weight loss, diet, and exercise Recommended diet heavy in fruits and veggies and low in animal meats, cheeses, and dairy products, appropriate calorie intake Discussed ideal weight for height  Will follow up in 3 months   Vitamin D Def At goal at last visit; continue supplementation to maintain goal of 70-100 Defer Vit D level  Gout Continue allopurinol Diet discussed Check uric acid as needed  Poison ivy rash  Can use benadryl, calamine lotion, contact precautions discussed.         Triamcinolone cream sent in - can apply BID   Continue diet and meds as discussed. Further disposition pending results of labs. Discussed med's effects and SE's.   Over 30 minutes of exam, counseling, chart review, and critical decision making was performed.   Future Appointments  Date Time Provider Department Center  03/28/2018 11:00 AM Lucky Cowboy, MD [facility] None    ----------------------------------------------------------------------------------------------------------------------  HPI 62 y.o. male  presents for 3 month follow up on hypertension, cholesterol, diabetes, weight, gout and vitamin D deficiency. He presents today with poison ivy rash to his left knee/foot which began 5  days ago.   BMI is Body mass index is 27.73 kg/m., he has been working on diet and exercise. Wt Readings from Last 3 Encounters:  01/06/18 224 lb 12.8 oz (102 kg)  09/13/17 228 lb 9.6 oz (103.7 kg)  05/20/17 219 lb 12.8 oz (99.7 kg)   His blood pressure has been controlled at home, today their BP is BP: 128/78  He does workout. He denies chest pain, shortness of breath, dizziness.   He is on cholesterol medication (fenofibrate daily) and denies myalgias. His cholesterol is not at goal. The cholesterol last visit was:   Lab Results  Component Value Date   CHOL 133 09/13/2017   HDL 33 (L) 09/13/2017   LDLCALC 74 09/13/2017   TRIG 159 (H) 09/13/2017   CHOLHDL 4.0 09/13/2017    He has been working on diet and exercise for T2 diabetes (on metformin), and denies foot ulcerations, hypoglycemia , increased appetite, nausea, paresthesia of the feet, polydipsia, polyuria, visual disturbances, vomiting and weight loss. He does check a fasting value daily and running 130s. Last A1C in the office was:  Lab Results  Component Value Date   HGBA1C 7.4 (H) 09/13/2017   Patient is on Vitamin D supplement and approaching goal at most recent check:    Lab Results  Component Value Date   VD25OH 58 09/13/2017     Patient is on allopurinol for gout and does not report a recent flare.  Lab Results  Component Value Date   LABURIC 5.1 09/13/2017      Current Medications:  Current Outpatient Medications on File Prior to Visit  Medication Sig  . allopurinol (ZYLOPRIM) 300 MG tablet Take 1 tablet daily to prevent Gout (Patient  taking differently: Take 300 mg by mouth daily. daily to prevent Gout)  . aspirin EC 81 MG tablet Take 81 mg by mouth at bedtime.  Marland Kitchen atenolol (TENORMIN) 50 MG tablet TAKE 1 TABLET DAILY TO PREVENT HEART ARRHYTHMIA.  Marland Kitchen azithromycin (ZITHROMAX) 250 MG tablet Take 2 tablets (500 mg) on  Day 1,  followed by 1 tablet (250 mg) once daily on Days 2 through 5.  . B Complex-C (SUPER B  COMPLEX PO) Take 1 tablet by mouth daily.  . fenofibrate micronized (LOFIBRA) 134 MG capsule TAKE 1 CAPSULE DAILY TO PREVENT GOUT  . metFORMIN (GLUCOPHAGE-XR) 500 MG 24 hr tablet TAKE 2 TABLETS TWICE A DAY  . montelukast (SINGULAIR) 10 MG tablet TAKE 1 TABLET DAILY FOR ALLERGIES  . Omega-3 Fatty Acids (FISH OIL) 1000 MG CAPS Take 1,000 mg by mouth 2 (two) times daily.   Marland Kitchen OVER THE COUNTER MEDICATION Takes OTC Advil PRN for headache  . predniSONE (DELTASONE) 10 MG tablet 1 tab 3 x day for 3 days, then 1 tab 2 x day for 3 days, then 1 tab 1 x day for 5 days  . Vitamin D, Cholecalciferol, 1000 UNITS TABS Take 1,000 mg by mouth 2 (two) times daily.    No current facility-administered medications on file prior to visit.      Allergies:  Allergies  Allergen Reactions  . Levaquin [Levofloxacin]     GI Upset  . Valtrex [Valacyclovir Hcl] Hives     Medical History:  Past Medical History:  Diagnosis Date  . Calculus of gallbladder with acute cholecystitis 11/08/2014   Via CT AB 2015   . Diabetes mellitus without complication (HCC)   . Fatty liver disease, nonalcoholic 11/08/2014  . Gout 07/17/2014  . H/O cluster headache   . History of nephrolithiasis 11/08/2014  . 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 Vitamin D deficiency    Family history- Reviewed and unchanged Social history- Reviewed and unchanged   Review of Systems:  Review of Systems  Constitutional: Negative for malaise/fatigue and weight loss.  HENT: Negative for hearing loss and tinnitus.   Eyes: Negative for blurred vision and double vision.  Respiratory: Negative for cough, shortness of breath and wheezing.   Cardiovascular: Negative for chest pain, palpitations, orthopnea, claudication and leg swelling.  Gastrointestinal: Negative for abdominal pain, blood in stool, constipation, diarrhea, heartburn, melena, nausea and vomiting.  Genitourinary: Negative.   Musculoskeletal: Negative for joint pain and myalgias.  Skin:  Positive for rash.  Neurological: Negative for dizziness, tingling, sensory change, weakness and headaches.  Endo/Heme/Allergies: Negative for polydipsia.  Psychiatric/Behavioral: Negative.   All other systems reviewed and are negative.   Physical Exam: BP 128/78   Wt 224 lb 12.8 oz (102 kg)   BMI 27.73 kg/m  Wt Readings from Last 3 Encounters:  01/06/18 224 lb 12.8 oz (102 kg)  09/13/17 228 lb 9.6 oz (103.7 kg)  05/20/17 219 lb 12.8 oz (99.7 kg)   General Appearance: Well nourished, in no apparent distress. Eyes: PERRLA, EOMs, conjunctiva no swelling or erythema Sinuses: No Frontal/maxillary tenderness ENT/Mouth: Ext aud canals clear, TMs without erythema, bulging. No erythema, swelling, or exudate on post pharynx.  Tonsils not swollen or erythematous. Hearing normal.  Neck: Supple, thyroid normal.  Respiratory: Respiratory effort normal, BS equal bilaterally without rales, rhonchi, wheezing or stridor.  Cardio: RRR with no MRGs. Brisk peripheral pulses without edema.  Abdomen: Soft, + BS.  Non tender, no guarding, rebound, hernias, masses. Lymphatics: Non tender  without lymphadenopathy.  Musculoskeletal: Full ROM, 5/5 strength, Normal gait Skin: Warm, dry; mild erythematous rash with distinct raised lesions without notable discharge to left knee and foot Neuro: Cranial nerves intact. No cerebellar symptoms.  Psych: Awake and oriented X 3, normal affect, Insight and Judgment appropriate.    Dan Maker, NP 9:50 AM [facility]

## 2018-01-06 ENCOUNTER — Ambulatory Visit: Payer: BLUE CROSS/BLUE SHIELD | Admitting: Adult Health

## 2018-01-06 ENCOUNTER — Encounter: Payer: Self-pay | Admitting: Adult Health

## 2018-01-06 VITALS — BP 128/78 | HR 64 | Temp 97.5°F | Resp 16 | Ht 75.5 in | Wt 224.8 lb

## 2018-01-06 DIAGNOSIS — K760 Fatty (change of) liver, not elsewhere classified: Secondary | ICD-10-CM

## 2018-01-06 DIAGNOSIS — E782 Mixed hyperlipidemia: Secondary | ICD-10-CM

## 2018-01-06 DIAGNOSIS — N182 Chronic kidney disease, stage 2 (mild): Secondary | ICD-10-CM | POA: Diagnosis not present

## 2018-01-06 DIAGNOSIS — E559 Vitamin D deficiency, unspecified: Secondary | ICD-10-CM

## 2018-01-06 DIAGNOSIS — I10 Essential (primary) hypertension: Secondary | ICD-10-CM | POA: Diagnosis not present

## 2018-01-06 DIAGNOSIS — Z79899 Other long term (current) drug therapy: Secondary | ICD-10-CM

## 2018-01-06 DIAGNOSIS — M1000 Idiopathic gout, unspecified site: Secondary | ICD-10-CM | POA: Diagnosis not present

## 2018-01-06 DIAGNOSIS — E1122 Type 2 diabetes mellitus with diabetic chronic kidney disease: Secondary | ICD-10-CM

## 2018-01-06 DIAGNOSIS — E663 Overweight: Secondary | ICD-10-CM

## 2018-01-06 MED ORDER — TRIAMCINOLONE ACETONIDE 0.1 % EX OINT: 1.0000 "application " | TOPICAL_OINTMENT | Freq: Two times a day (BID) | CUTANEOUS | 1 refills | Status: DC

## 2018-01-06 MED ORDER — ALLOPURINOL 300 MG PO TABS: ORAL_TABLET | ORAL | 3 refills | Status: DC

## 2018-01-06 MED ORDER — FENOFIBRATE MICRONIZED 134 MG PO CAPS: 134.0000 mg | ORAL_CAPSULE | Freq: Every day | ORAL | 1 refills | Status: DC

## 2018-01-06 NOTE — Patient Instructions (Signed)
Can apply triamcinolone cream twice a day, benadryl gel can also help with itching, oral benadryl at night if having trouble sleeping, calamine lotion/oatmeal baths can also help    Poison Ivy Dermatitis Poison ivy dermatitis is inflammation of the skin that is caused by the allergens on the leaves of the poison ivy plant. The skin reaction often involves redness, swelling, blisters, and extreme itching. What are the causes? This condition is caused by a specific chemical (urushiol) found in the sap of the poison ivy plant. This chemical is sticky and can be easily spread to people, animals, and objects. You can get poison ivy dermatitis by:  Having direct contact with a poison ivy plant.  Touching animals, other people, or objects that have come in contact with poison ivy and have the chemical on them.  What increases the risk? This condition is more likely to develop in:  People who are outdoors often.  People who go outdoors without wearing protective clothing, such as closed shoes, long pants, and a long-sleeved shirt.  What are the signs or symptoms? Symptoms of this condition include:  Redness and itching.  A rash that often includes bumps and blisters. The rash usually appears 48 hours after exposure.  Swelling. This may occur if the reaction is more severe.  Symptoms usually last for 1-2 weeks. However, the first time you develop this condition, symptoms may last 3-4 weeks. How is this diagnosed? This condition may be diagnosed based on your symptoms and a physical exam. Your health care provider may also ask you about any recent outdoor activity. How is this treated? Treatment for this condition will vary depending on how severe it is. Treatment may include:  Hydrocortisone creams or calamine lotions to relieve itching.  Oatmeal baths to soothe the skin.  Over-the-counter antihistamine tablets.  Oral steroid medicine for more severe outbreaks.  Follow these  instructions at home:  Take or apply over-the-counter and prescription medicines only as told by your health care provider.  Wash exposed skin as soon as possible with soap and cold water.  Use hydrocortisone creams or calamine lotion as needed to soothe the skin and relieve itching.  Take oatmeal baths as needed. Use colloidal oatmeal. You can get this at your local pharmacy or grocery store. Follow the instructions on the packaging.  Do not scratch or rub your skin.  While you have the rash, wash clothes right after you wear them. How is this prevented?  Learn to identify the poison ivy plant and avoid contact with the plant. This plant can be recognized by the number of leaves. Generally, poison ivy has three leaves with flowering branches on a single stem. The leaves are typically glossy, and they have jagged edges that come to a point at the front.  If you have been exposed to poison ivy, thoroughly wash with soap and water right away. You have about 30 minutes to remove the plant resin before it will cause the rash. Be sure to wash under your fingernails because any plant resin there will continue to spread the rash.  When hiking or camping, wear clothes that will help you to avoid exposure on the skin. This includes long pants, a long-sleeved shirt, tall socks, and hiking boots. You can also apply preventive lotion to your skin to help limit exposure.  If you suspect that your clothes or outdoor gear came in contact with poison ivy, rinse them off outside with a garden hose before you bring them inside your  house. Contact a health care provider if:  You have open sores in the rash area.  You have more redness, swelling, or pain in the affected area.  You have redness that spreads beyond the rash area.  You have fluid, blood, or pus coming from the affected area.  You have a fever.  You have a rash over a large area of your body.  You have a rash on your eyes, mouth, or  genitals.  Your rash does not improve after a few days. Get help right away if:  Your face swells or your eyes swell shut.  You have trouble breathing.  You have trouble swallowing. This information is not intended to replace advice given to you by your health care provider. Make sure you discuss any questions you have with your health care provider. Document Released: 08/14/2000 Document Revised: 01/23/2016 Document Reviewed: 01/23/2015 Elsevier Interactive Patient Education  [employer].

## 2018-01-06 NOTE — Addendum Note (Signed)
Addended by: Dan Maker on: 01/06/2018 10:08 AM   Modules accepted: Orders

## 2018-01-07 LAB — COMPLETE METABOLIC PANEL WITH GFR
AG RATIO: 1.8 (calc) (ref 1.0–2.5)
ALBUMIN MSPROF: 4.6 g/dL (ref 3.6–5.1)
ALKALINE PHOSPHATASE (APISO): 70 U/L (ref 40–115)
ALT: 16 U/L (ref 9–46)
AST: 16 U/L (ref 10–35)
BILIRUBIN TOTAL: 0.4 mg/dL (ref 0.2–1.2)
BUN / CREAT RATIO: 23 (calc) — AB (ref 6–22)
BUN: 27 mg/dL — AB (ref 7–25)
CHLORIDE: 107 mmol/L (ref 98–110)
CO2: 25 mmol/L (ref 20–32)
Calcium: 9.7 mg/dL (ref 8.6–10.3)
Creat: 1.18 mg/dL (ref 0.70–1.25)
GFR, Est African American: 76 mL/min/{1.73_m2} (ref >=60)
GFR, Est Non African American: 65 mL/min/{1.73_m2} (ref >=60)
GLUCOSE: 135 mg/dL — AB (ref 65–99)
Globulin: 2.5 g/dL (calc) (ref 1.9–3.7)
POTASSIUM: 4.6 mmol/L (ref 3.5–5.3)
SODIUM: 137 mmol/L (ref 135–146)
TOTAL PROTEIN: 7.1 g/dL (ref 6.1–8.1)

## 2018-01-07 LAB — CBC WITH DIFFERENTIAL/PLATELET
BASOS PCT: 0.3 %
Basophils Absolute: 18 cells/uL (ref 0–200)
EOS ABS: 183 {cells}/uL (ref 15–500)
Eosinophils Relative: 3.1 %
HCT: 40.7 % (ref 38.5–50.0)
Hemoglobin: 13.6 g/dL (ref 13.2–17.1)
Lymphs Abs: 1859 cells/uL (ref 850–3900)
MCH: 28.8 pg (ref 27.0–33.0)
MCHC: 33.4 g/dL (ref 32.0–36.0)
MCV: 86.2 fL (ref 80.0–100.0)
MONOS PCT: 7.7 %
MPV: 12 fL (ref 7.5–12.5)
Neutro Abs: 3387 cells/uL (ref 1500–7800)
Neutrophils Relative %: 57.4 %
Platelets: 284 10*3/uL (ref 140–400)
RBC: 4.72 10*6/uL (ref 4.20–5.80)
RDW: 12.4 % (ref 11.0–15.0)
TOTAL LYMPHOCYTE: 31.5 %
WBC mixed population: 454 cells/uL (ref 200–950)
WBC: 5.9 10*3/uL (ref 3.8–10.8)

## 2018-01-07 LAB — LIPID PANEL
Cholesterol: 108 mg/dL (ref <200)
HDL: 32 mg/dL — ABNORMAL LOW (ref >40)
LDL Cholesterol (Calc): 58 mg/dL (calc)
NON-HDL CHOLESTEROL (CALC): 76 mg/dL (ref <130)
Total CHOL/HDL Ratio: 3.4 (calc) (ref <5.0)
Triglycerides: 101 mg/dL (ref <150)

## 2018-01-07 LAB — HEMOGLOBIN A1C
Hgb A1c MFr Bld: 7.9 % of total Hgb — ABNORMAL HIGH (ref <5.7)
Mean Plasma Glucose: 180 (calc)
eAG (mmol/L): 10 (calc)

## 2018-01-07 LAB — TSH: TSH: 1.48 mIU/L (ref 0.40–4.50)

## 2018-01-20 ENCOUNTER — Other Ambulatory Visit: Payer: Self-pay | Admitting: Internal Medicine

## 2018-01-20 DIAGNOSIS — E1121 Type 2 diabetes mellitus with diabetic nephropathy: Secondary | ICD-10-CM

## 2018-01-20 DIAGNOSIS — N182 Chronic kidney disease, stage 2 (mild): Secondary | ICD-10-CM

## 2018-01-20 DIAGNOSIS — E1122 Type 2 diabetes mellitus with diabetic chronic kidney disease: Secondary | ICD-10-CM

## 2018-01-27 ENCOUNTER — Other Ambulatory Visit: Payer: Self-pay | Admitting: Internal Medicine

## 2018-01-27 DIAGNOSIS — M1000 Idiopathic gout, unspecified site: Secondary | ICD-10-CM

## 2018-02-11 ENCOUNTER — Encounter: Payer: Self-pay | Admitting: Internal Medicine

## 2018-03-16 ENCOUNTER — Other Ambulatory Visit: Payer: Self-pay | Admitting: Internal Medicine

## 2018-03-16 DIAGNOSIS — I472 Ventricular tachycardia, unspecified: Secondary | ICD-10-CM

## 2018-03-28 ENCOUNTER — Encounter: Payer: Self-pay | Admitting: Internal Medicine

## 2018-04-02 ENCOUNTER — Other Ambulatory Visit: Payer: Self-pay | Admitting: Adult Health

## 2018-04-09 ENCOUNTER — Other Ambulatory Visit: Payer: Self-pay | Admitting: Adult Health

## 2018-04-09 DIAGNOSIS — E782 Mixed hyperlipidemia: Secondary | ICD-10-CM

## 2018-04-18 ENCOUNTER — Other Ambulatory Visit: Payer: Self-pay | Admitting: Internal Medicine

## 2018-04-18 DIAGNOSIS — E1121 Type 2 diabetes mellitus with diabetic nephropathy: Secondary | ICD-10-CM

## 2018-04-18 DIAGNOSIS — N182 Chronic kidney disease, stage 2 (mild): Secondary | ICD-10-CM

## 2018-04-18 DIAGNOSIS — E1122 Type 2 diabetes mellitus with diabetic chronic kidney disease: Secondary | ICD-10-CM

## 2018-04-18 DIAGNOSIS — M1000 Idiopathic gout, unspecified site: Secondary | ICD-10-CM

## 2018-04-22 ENCOUNTER — Encounter: Payer: Self-pay | Admitting: Internal Medicine

## 2018-06-07 ENCOUNTER — Encounter: Payer: Self-pay | Admitting: Internal Medicine

## 2018-06-07 NOTE — Patient Instructions (Signed)

## 2018-06-07 NOTE — Progress Notes (Signed)
[facility]   Lucky Cowboy, M.D.     Dyanne Carrel. Steffanie Dunn, P.A.-C Judd Gaudier, DNP [facility]                [address]                [place], [place]. [address] Telephone [phone] Telefax [phone] Annual  Screening/Preventative Visit  & Comprehensive Evaluation & Examination     This very nice 62 y.o. MWMpresents for a Screening /Preventative Visit & comprehensive evaluation and management of multiple medical co-morbidities.  Patient has been followed for HTN, HLD, T2_NIDDM  and Vitamin D Deficiency. Patient has hx/o Gout (2007) controlled on Allopurinol.      Patient has hx/o labile HTN predates circa 2010. Patient's BP has been controlled at home.  Today's BP is at goal - 124/72. 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 Patient denies any cardiac symptoms as chest pain, palpitations, shortness of breath, dizziness or ankle swelling.     Patient's hyperlipidemia is controlled with diet and medications. Patient denies myalgias or other medication SE's. Last lipids were  Lab Results  Component Value Date   CHOL 108 01/06/2018   HDL 32 (L) 01/06/2018   LDLCALC 58 01/06/2018   TRIG 101 01/06/2018   CHOLHDL 3.4 01/06/2018      Patient has hx/o T2_NIDDM (2013)  and patient denies reactive hypoglycemic symptoms, visual blurring, diabetic polys or paresthesias. Last A1c was not at goal: Lab Results  Component Value Date   HGBA1C 7.9 (H) 01/06/2018       Finally, patient has history of Vitamin D Deficiency ("34"/2012) and last vitamin D was near goal: Lab Results  Component Value Date   VD25OH 58 09/13/2017   Current Outpatient Medications on File Prior to Visit  Medication Sig  . allopurinol (ZYLOPRIM) 300 MG tablet TAKE 1 TABLET DAILY TO PREVENT GOUT  . aspirin EC 81 MG tablet Take 81 mg by mouth at bedtime.  Marland Kitchen atenolol (TENORMIN) 50 MG tablet TAKE 1 TABLET DAILY TO PREVENT HEART ARRHYTHMIA.  . B  Complex-C (SUPER B COMPLEX PO) Take 1 tablet by mouth daily.  . fenofibrate micronized (LOFIBRA) 134 MG capsule TAKE 1 CAPSULE DAILY TO PREVENT GOUT  . metFORMIN (GLUCOPHAGE-XR) 500 MG 24 hr tablet TAKE 2 TABLETS TWICE A DAY  . montelukast (SINGULAIR) 10 MG tablet TAKE 1 TABLET DAILY FOR ALLERGIES  . Omega-3 Fatty Acids (FISH OIL) 1000 MG CAPS Take 1,000 mg by mouth 2 (two) times daily.   Marland Kitchen OVER THE COUNTER MEDICATION Takes OTC Advil PRN for headache  . triamcinolone ointment (KENALOG) 0.1 % Apply 1 application topically 2 (two) times daily.  . Vitamin D, Cholecalciferol, 1000 UNITS TABS Take 1,000 mg by mouth 2 (two) times daily.    No current facility-administered medications on file prior to visit.    Allergies  Allergen Reactions  . Levaquin [Levofloxacin]     GI Upset  . Valtrex [Valacyclovir Hcl] Hives   Past Medical History:  Diagnosis Date  . Calculus of gallbladder with acute cholecystitis 11/08/2014   Via CT AB 2015   . Diabetes mellitus without complication (HCC)   . Fatty liver disease, nonalcoholic 11/08/2014  . Gout 07/17/2014  . H/O cluster headache   . History of nephrolithiasis 11/08/2014  . 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 Vitamin D deficiency    Health Maintenance  Topic Date Due  . OPHTHALMOLOGY EXAM  04/26/1966  . COLONOSCOPY  04/26/2004  . URINE MICROALBUMIN  02/02/2018  . INFLUENZA VACCINE  03/31/2018  . HEMOGLOBIN A1C  07/09/2018  . FOOT EXAM  06/09/2019  . TETANUS/TDAP  09/29/2025  . Hepatitis C Screening  Completed  . HIV Screening  Completed   Immunization History  Administered Date(s) Administered  . Influenza Inj Mdck Quad With Preservative 05/20/2017  . Influenza Split 05/15/2013, 07/17/2014, 06/18/2015  . Influenza, Seasonal, Injecte, Preservative Fre 09/08/2016  . PPD Test 07/17/2014, 09/30/2015, 02/02/2017  . Pneumococcal Polysaccharide-23 02/07/2016  . Tdap 09/30/2015   Last Colon - ~~ 2005 per Dr Kinnie Scales and overdue - will request  f/u  Past Surgical History:  Procedure Laterality Date  . NASAL SINUS SURGERY    . TOE SURGERY    . VARICOCELE EXCISION     Family History  Problem Relation Age of Onset  . Hypertension Father   . Diabetes Father   . Heart attack Father 58   Social History   Socioeconomic History  . Marital status: Married    Spouse name: Not on file  . Number of children: 1  . Years of education: Not on file  . Highest education level: Not on file  Occupational History  . Occupation: Biochemist, clinical  Tobacco Use  . Smoking status: Never Smoker  . Smokeless tobacco: Never Used  Substance and Sexual Activity  . Alcohol use: No  . Drug use: No  . Sexual activity: Not on file    ROS Constitutional: Denies fever, chills, weight loss/gain, headaches, insomnia,  night sweats or change in appetite. Does c/o fatigue. Eyes: Denies redness, blurred vision, diplopia, discharge, itchy or watery eyes.  ENT: Denies discharge, congestion, post nasal drip, epistaxis, sore throat, earache, hearing loss, dental pain, Tinnitus, Vertigo, Sinus pain or snoring.  Cardio: Denies chest pain, palpitations, irregular heartbeat, syncope, dyspnea, diaphoresis, orthopnea, PND, claudication or edema Respiratory: denies cough, dyspnea, DOE, pleurisy, hoarseness, laryngitis or wheezing.  Gastrointestinal: Denies dysphagia, heartburn, reflux, water brash, pain, cramps, nausea, vomiting, bloating, diarrhea, constipation, hematemesis, melena, hematochezia, jaundice or hemorrhoids Genitourinary: Denies dysuria, frequency, urgency, nocturia, hesitancy, discharge, hematuria or flank pain Musculoskeletal: Denies arthralgia, myalgia, stiffness, Jt. Swelling, pain, limp or strain/sprain. Denies Falls. Skin: Denies puritis, rash, hives, warts, acne, eczema or change in skin lesion Neuro: No weakness, tremor, incoordination, spasms, paresthesia or pain Psychiatric: Denies confusion, memory loss or sensory loss. Denies  Depression. Endocrine: Denies change in weight, skin, hair change, nocturia, and paresthesia, diabetic polys, visual blurring or hyper / hypo glycemic episodes.  Heme/Lymph: No excessive bleeding, bruising or enlarged lymph nodes.  Physical Exam  BP 124/72   Pulse 60   Temp (!) 97.5 F (36.4 C)   Resp 16   Ht 6' 3.5" (1.918 m)   Wt 220 lb 9.6 oz (100.1 kg)   BMI 27.21 kg/m   General Appearance: Well nourished and well groomed and in no apparent distress.  Eyes: PERRLA, EOMs, conjunctiva no swelling or erythema, normal fundi and vessels. Sinuses: No frontal/maxillary tenderness ENT/Mouth: EACs patent / TMs  nl. Nares clear without erythema, swelling, mucoid exudates. Oral hygiene is good. No erythema, swelling, or exudate. Tongue normal, non-obstructing. Tonsils not swollen or erythematous. Hearing normal.  Neck: Supple, thyroid not palpable. No bruits, nodes or JVD. Respiratory: Respiratory effort normal.  BS equal and clear bilateral without rales, rhonci, wheezing or stridor. Cardio: Heart sounds are normal with regular rate and rhythm and no murmurs, rubs or gallops. Peripheral pulses are normal and  equal bilaterally without edema. No aortic or femoral bruits. Chest: symmetric with normal excursions and percussion.  Abdomen: Soft, with Nl bowel sounds. Nontender, no guarding, rebound, hernias, masses, or organomegaly.  Lymphatics: Non tender without lymphadenopathy.  Genitourinary:   DRE - deferred to colonoscopy. Musculoskeletal: Full ROM all peripheral extremities, joint stability, 5/5 strength, and normal gait. Skin: Warm and dry without rashes, lesions, cyanosis, clubbing or  ecchymosis.  Neuro: Cranial nerves intact, reflexes equal bilaterally. Normal muscle tone, no cerebellar symptoms. Sensation intact.  Pysch: Alert and oriented X 3 with normal affect, insight and judgment appropriate.   Assessment and Plan  1. Annual Preventative/Screening Exam   2. Essential  hypertension  - EKG 12-Lead - [place], RETROPERITNL ABD,  LTD - Urinalysis, Routine w reflex microscopic - Microalbumin / creatinine urine ratio - CBC with Differential/Platelet - COMPLETE METABOLIC PANEL WITH GFR - Magnesium - TSH  3. Hyperlipidemia, mixed  - EKG 12-Lead - [place], RETROPERITNL ABD,  LTD - Lipid panel - TSH  4. Type 2 diabetes mellitus with stage 2 chronic kidney disease, without long-term current use of insulin (HCC)  - EKG 12-Lead - [place], RETROPERITNL ABD,  LTD - Urinalysis, Routine w reflex microscopic - Microalbumin / creatinine urine ratio - HM DIABETES FOOT EXAM - LOW EXTREMITY NEUR EXAM DOCUM - Hemoglobin A1c - Insulin, random  5. Vitamin D deficiency  - TSH - VITAMIN D 25 Hydroxyl  6. Idiopathic gout  - Uric acid  7. Screening for colorectal cancer  - POC Hemoccult Bld/Stl   8. Prostate cancer screening  - PSA  9. Screening examination for pulmonary tuberculosis  10. Screening for ischemic heart disease  - EKG 12-Lead - Lipid panel  11. Screening for AAA (aortic abdominal aneurysm)  - [place], RETROPERITNL ABD,  LTD  12. FHx: heart disease  - EKG 12-Lead - [place], RETROPERITNL ABD,  LTD  13. Fatigue  - Iron,Total/Total Iron Binding Cap - Vitamin B12 - Testosterone - CBC with Differential/Platelet - TSH  14. Medication management  - Urinalysis, Routine w reflex microscopic - Microalbumin / creatinine urine ratio - Uric acid - CBC with Differential/Platelet - COMPLETE METABOLIC PANEL WITH GFR - Magnesium - Lipid panel - TSH - Hemoglobin A1c - Insulin, random - VITAMIN D 25 Hydroxyl       Patient was counseled in prudent diet, weight control to achieve/maintain BMI less than 25, BP monitoring, regular exercise and medications as discussed.  Discussed med effects and SE's. Routine screening labs and tests as requested with regular follow-up as recommended. Over 40 minutes of exam, counseling, chart review and high complex critical  decision making was performed

## 2018-06-08 ENCOUNTER — Ambulatory Visit: Payer: BLUE CROSS/BLUE SHIELD | Admitting: Internal Medicine

## 2018-06-08 VITALS — BP 124/72 | HR 60 | Temp 97.5°F | Resp 16 | Ht 75.5 in | Wt 220.6 lb

## 2018-06-08 DIAGNOSIS — I10 Essential (primary) hypertension: Secondary | ICD-10-CM

## 2018-06-08 DIAGNOSIS — E559 Vitamin D deficiency, unspecified: Secondary | ICD-10-CM | POA: Diagnosis not present

## 2018-06-08 DIAGNOSIS — Z1389 Encounter for screening for other disorder: Secondary | ICD-10-CM | POA: Diagnosis not present

## 2018-06-08 DIAGNOSIS — Z130 Encounter for screening for diseases of the blood and blood-forming organs and certain disorders involving the immune mechanism: Secondary | ICD-10-CM | POA: Diagnosis not present

## 2018-06-08 DIAGNOSIS — Z136 Encounter for screening for cardiovascular disorders: Secondary | ICD-10-CM | POA: Diagnosis not present

## 2018-06-08 DIAGNOSIS — Z79899 Other long term (current) drug therapy: Secondary | ICD-10-CM

## 2018-06-08 DIAGNOSIS — Z1211 Encounter for screening for malignant neoplasm of colon: Secondary | ICD-10-CM

## 2018-06-08 DIAGNOSIS — N401 Enlarged prostate with lower urinary tract symptoms: Secondary | ICD-10-CM

## 2018-06-08 DIAGNOSIS — Z111 Encounter for screening for respiratory tuberculosis: Secondary | ICD-10-CM | POA: Diagnosis not present

## 2018-06-08 DIAGNOSIS — R5383 Other fatigue: Secondary | ICD-10-CM

## 2018-06-08 DIAGNOSIS — E1122 Type 2 diabetes mellitus with diabetic chronic kidney disease: Secondary | ICD-10-CM

## 2018-06-08 DIAGNOSIS — E782 Mixed hyperlipidemia: Secondary | ICD-10-CM

## 2018-06-08 DIAGNOSIS — Z125 Encounter for screening for malignant neoplasm of prostate: Secondary | ICD-10-CM | POA: Diagnosis not present

## 2018-06-08 DIAGNOSIS — N182 Chronic kidney disease, stage 2 (mild): Secondary | ICD-10-CM

## 2018-06-08 DIAGNOSIS — Z1329 Encounter for screening for other suspected endocrine disorder: Secondary | ICD-10-CM | POA: Diagnosis not present

## 2018-06-08 DIAGNOSIS — Z131 Encounter for screening for diabetes mellitus: Secondary | ICD-10-CM

## 2018-06-08 DIAGNOSIS — Z23 Encounter for immunization: Secondary | ICD-10-CM | POA: Diagnosis not present

## 2018-06-08 DIAGNOSIS — Z0001 Encounter for general adult medical examination with abnormal findings: Secondary | ICD-10-CM

## 2018-06-08 DIAGNOSIS — M1000 Idiopathic gout, unspecified site: Secondary | ICD-10-CM

## 2018-06-08 DIAGNOSIS — R350 Frequency of micturition: Secondary | ICD-10-CM

## 2018-06-08 DIAGNOSIS — Z8249 Family history of ischemic heart disease and other diseases of the circulatory system: Secondary | ICD-10-CM

## 2018-06-08 DIAGNOSIS — Z0000 Encounter for general adult medical examination without abnormal findings: Secondary | ICD-10-CM

## 2018-06-08 DIAGNOSIS — Z13220 Encounter for screening for lipoid disorders: Secondary | ICD-10-CM | POA: Diagnosis not present

## 2018-06-08 DIAGNOSIS — Z1212 Encounter for screening for malignant neoplasm of rectum: Secondary | ICD-10-CM

## 2018-06-09 LAB — URIC ACID: Uric Acid, Serum: 4.8 mg/dL (ref 4.0–8.0)

## 2018-06-09 LAB — COMPLETE METABOLIC PANEL WITH GFR
AG Ratio: 2 (calc) (ref 1.0–2.5)
ALKALINE PHOSPHATASE (APISO): 57 U/L (ref 40–115)
ALT: 16 U/L (ref 9–46)
AST: 15 U/L (ref 10–35)
Albumin: 4.5 g/dL (ref 3.6–5.1)
BUN: 23 mg/dL (ref 7–25)
CO2: 25 mmol/L (ref 20–32)
Calcium: 9.9 mg/dL (ref 8.6–10.3)
Chloride: 105 mmol/L (ref 98–110)
Creat: 1.01 mg/dL (ref 0.70–1.25)
GFR, Est African American: 91 mL/min/{1.73_m2} (ref >=60)
GFR, Est Non African American: 78 mL/min/{1.73_m2} (ref >=60)
GLUCOSE: 96 mg/dL (ref 65–99)
Globulin: 2.3 g/dL (calc) (ref 1.9–3.7)
Potassium: 4.2 mmol/L (ref 3.5–5.3)
Sodium: 139 mmol/L (ref 135–146)
Total Bilirubin: 0.3 mg/dL (ref 0.2–1.2)
Total Protein: 6.8 g/dL (ref 6.1–8.1)

## 2018-06-09 LAB — CBC WITH DIFFERENTIAL/PLATELET
Basophils Absolute: 33 cells/uL (ref 0–200)
Basophils Relative: 0.5 %
EOS ABS: 254 {cells}/uL (ref 15–500)
Eosinophils Relative: 3.9 %
HEMATOCRIT: 36.1 % — AB (ref 38.5–50.0)
Hemoglobin: 12 g/dL — ABNORMAL LOW (ref 13.2–17.1)
LYMPHS ABS: 1814 {cells}/uL (ref 850–3900)
MCH: 28.4 pg (ref 27.0–33.0)
MCHC: 33.2 g/dL (ref 32.0–36.0)
MCV: 85.3 fL (ref 80.0–100.0)
MPV: 11.9 fL (ref 7.5–12.5)
Monocytes Relative: 6.2 %
NEUTROS PCT: 61.5 %
Neutro Abs: 3998 cells/uL (ref 1500–7800)
Platelets: 265 10*3/uL (ref 140–400)
RBC: 4.23 10*6/uL (ref 4.20–5.80)
RDW: 13.6 % (ref 11.0–15.0)
TOTAL LYMPHOCYTE: 27.9 %
WBC: 6.5 10*3/uL (ref 3.8–10.8)
WBCMIX: 403 {cells}/uL (ref 200–950)

## 2018-06-09 LAB — URINALYSIS, ROUTINE W REFLEX MICROSCOPIC
BILIRUBIN URINE: NEGATIVE
Glucose, UA: NEGATIVE
Hgb urine dipstick: NEGATIVE
KETONES UR: NEGATIVE
Leukocytes, UA: NEGATIVE
NITRITE: NEGATIVE
PROTEIN: NEGATIVE
Specific Gravity, Urine: 1.021 (ref 1.001–1.03)
pH: <=5 (ref 5.0–8.0)

## 2018-06-09 LAB — PSA: PSA: 0.4 ng/mL (ref <=4.0)

## 2018-06-09 LAB — MICROALBUMIN / CREATININE URINE RATIO
CREATININE, URINE: 132 mg/dL (ref 20–320)
Microalb Creat Ratio: 2 mcg/mg creat (ref <30)
Microalb, Ur: 0.3 mg/dL

## 2018-06-09 LAB — HEMOGLOBIN A1C
HEMOGLOBIN A1C: 7 %{Hb} — AB (ref <5.7)
Mean Plasma Glucose: 154 (calc)
eAG (mmol/L): 8.5 (calc)

## 2018-06-09 LAB — LIPID PANEL
CHOLESTEROL: 115 mg/dL (ref <200)
HDL: 36 mg/dL — AB (ref >40)
LDL CHOLESTEROL (CALC): 61 mg/dL
NON-HDL CHOLESTEROL (CALC): 79 mg/dL (ref <130)
TRIGLYCERIDES: 99 mg/dL (ref <150)
Total CHOL/HDL Ratio: 3.2 (calc) (ref <5.0)

## 2018-06-09 LAB — MAGNESIUM: Magnesium: 1.4 mg/dL — ABNORMAL LOW (ref 1.5–2.5)

## 2018-06-09 LAB — TSH: TSH: 1.07 m[IU]/L (ref 0.40–4.50)

## 2018-06-09 LAB — VITAMIN B12: Vitamin B-12: 263 pg/mL (ref 200–1100)

## 2018-06-09 LAB — INSULIN, RANDOM: Insulin: 3 u[IU]/mL (ref 2.0–19.6)

## 2018-06-09 LAB — IRON, TOTAL/TOTAL IRON BINDING CAP
%SAT: 14 % (calc) — ABNORMAL LOW (ref 20–48)
IRON: 53 ug/dL (ref 50–180)
TIBC: 378 mcg/dL (calc) (ref 250–425)

## 2018-06-09 LAB — TESTOSTERONE: TESTOSTERONE: 270 ng/dL (ref 250–827)

## 2018-06-09 LAB — VITAMIN D 25 HYDROXY (VIT D DEFICIENCY, FRACTURES): Vit D, 25-Hydroxy: 39 ng/mL (ref 30–100)

## 2018-06-10 ENCOUNTER — Encounter: Payer: Self-pay | Admitting: *Deleted

## 2018-06-12 ENCOUNTER — Encounter: Payer: Self-pay | Admitting: Internal Medicine

## 2018-06-13 ENCOUNTER — Other Ambulatory Visit: Payer: Self-pay | Admitting: Internal Medicine

## 2018-06-13 DIAGNOSIS — I472 Ventricular tachycardia, unspecified: Secondary | ICD-10-CM

## 2018-06-15 ENCOUNTER — Other Ambulatory Visit: Payer: Self-pay | Admitting: Internal Medicine

## 2018-06-15 DIAGNOSIS — I472 Ventricular tachycardia, unspecified: Secondary | ICD-10-CM

## 2018-06-15 DIAGNOSIS — E1122 Type 2 diabetes mellitus with diabetic chronic kidney disease: Secondary | ICD-10-CM

## 2018-06-15 DIAGNOSIS — N182 Chronic kidney disease, stage 2 (mild): Secondary | ICD-10-CM

## 2018-06-15 DIAGNOSIS — M1000 Idiopathic gout, unspecified site: Secondary | ICD-10-CM

## 2018-06-15 DIAGNOSIS — E1121 Type 2 diabetes mellitus with diabetic nephropathy: Secondary | ICD-10-CM

## 2018-06-15 MED ORDER — ATENOLOL 50 MG PO TABS: ORAL_TABLET | ORAL | 1 refills | Status: DC

## 2018-06-22 IMAGING — CT CT ABD-PELV W/ CM
2 of 5 series · 16 of 46 positions shown, 18 images · IV contrast (APPLIED)
Comparison: CT 10/27/2011

CLINICAL DATA: 61-year-old male with a history of sudden onset left
lower quadrant pain with nausea and vomiting

EXAM:
CT ABDOMEN AND PELVIS WITH CONTRAST
TECHNIQUE: Multidetector CT imaging of the abdomen and pelvis was performed
using the standard protocol following bolus administration of
intravenous contrast.
CONTRAST:  75mL [id number] IOPAMIDOL ([id number]) INJECTION 61%

[Series 3: abdomen 5.0 · axial · 0.72mm/px · z∈[+796,+1226]mm · 13 of 100 slices shown, 15 images]
[im 7/100  soft-tissue]
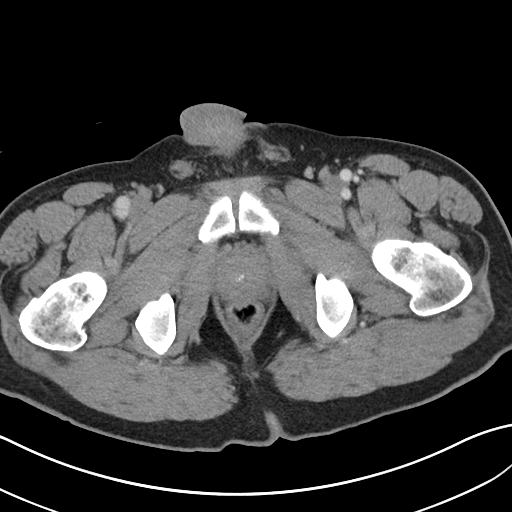
[im 7/100  bone]
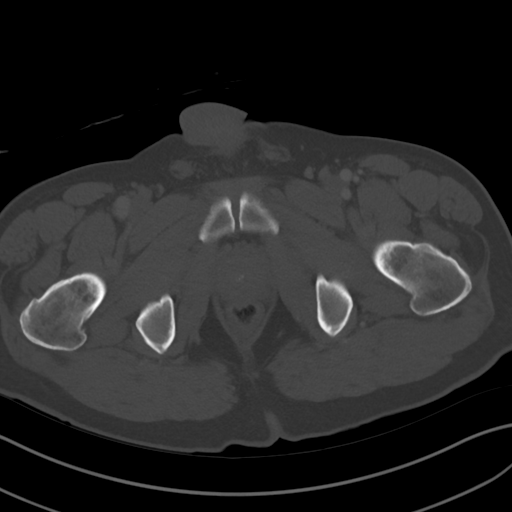
[im 14/100  soft-tissue]
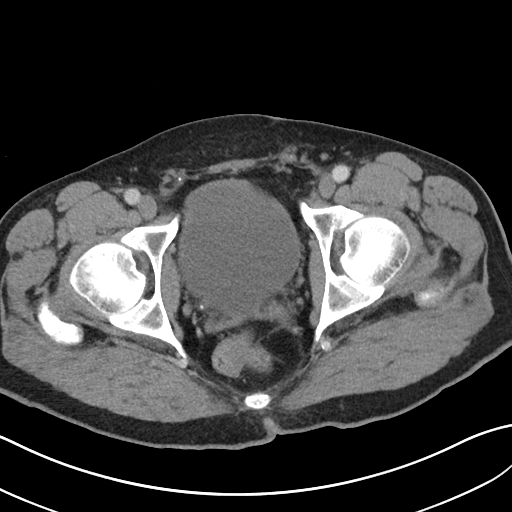
[im 20/100  soft-tissue]
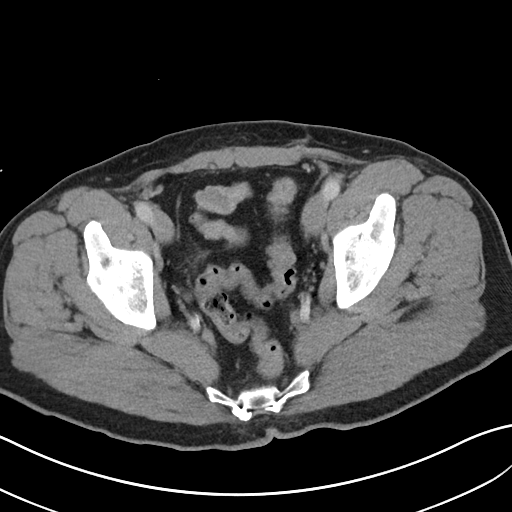
[im 27/100  soft-tissue]
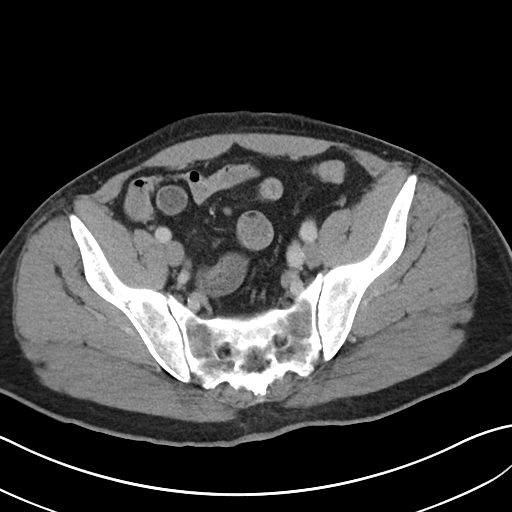
[im 34/100  soft-tissue]
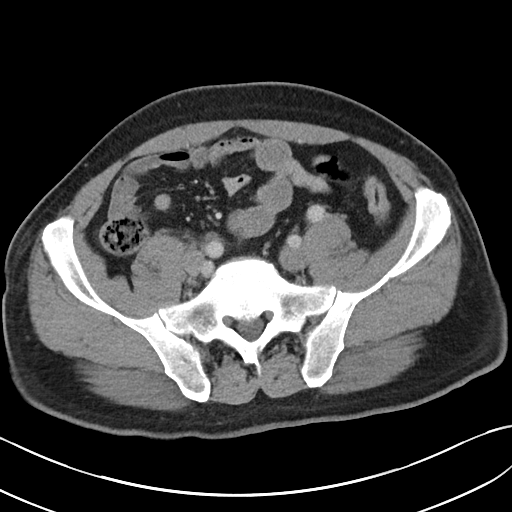
[im 40/100  soft-tissue]
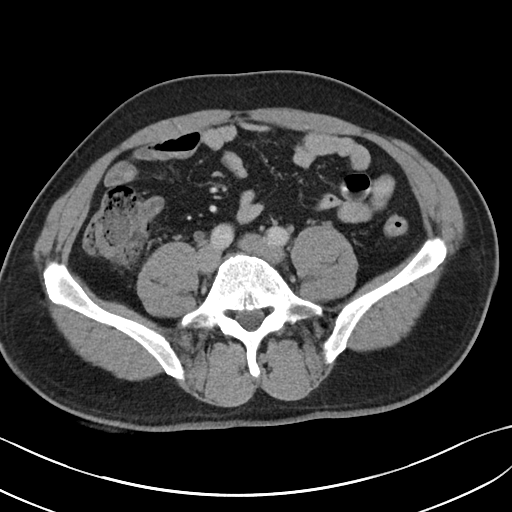
[im 53/100  soft-tissue]
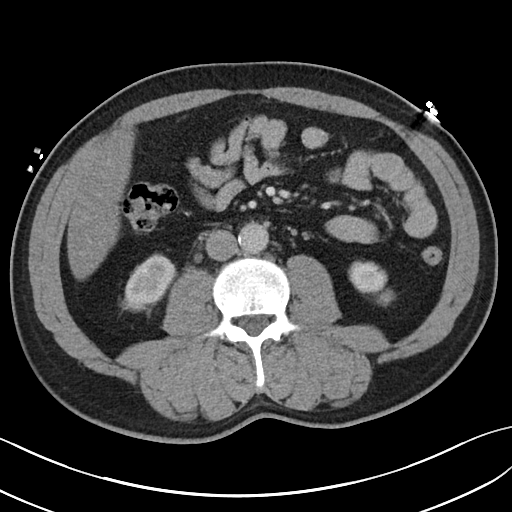
[im 60/100  soft-tissue]
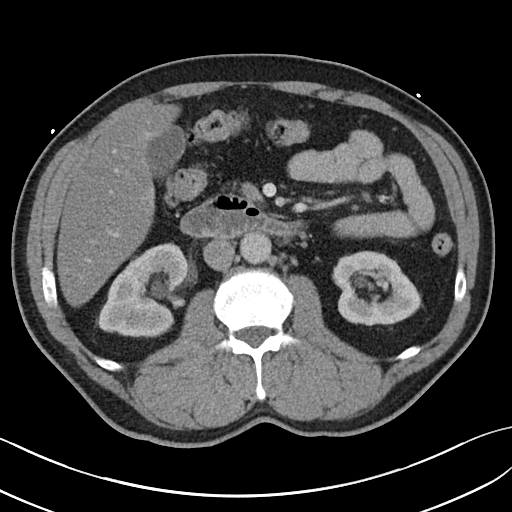
[im 67/100  soft-tissue]
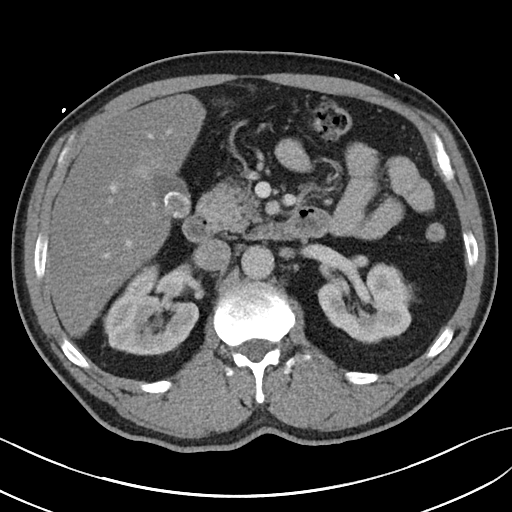
[im 67/100  bone]
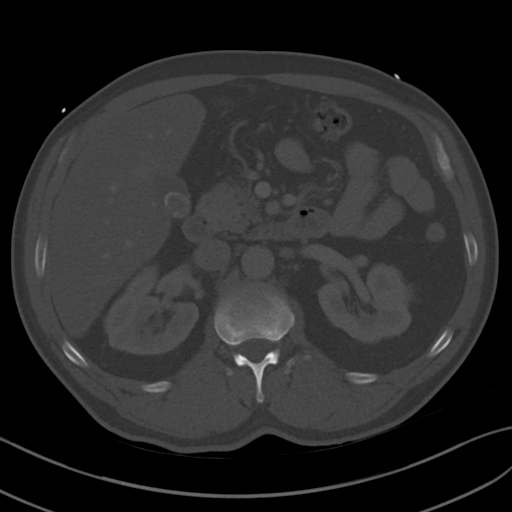
[im 73/100  soft-tissue]
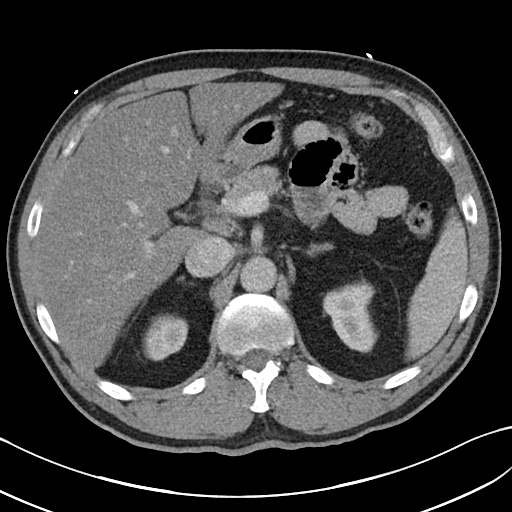
[im 80/100  soft-tissue]
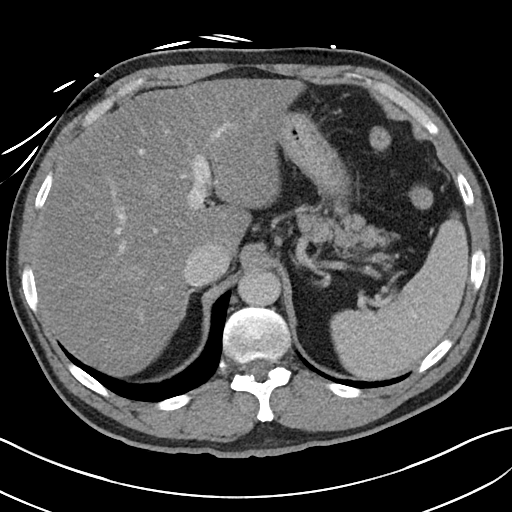
[im 86/100  soft-tissue]
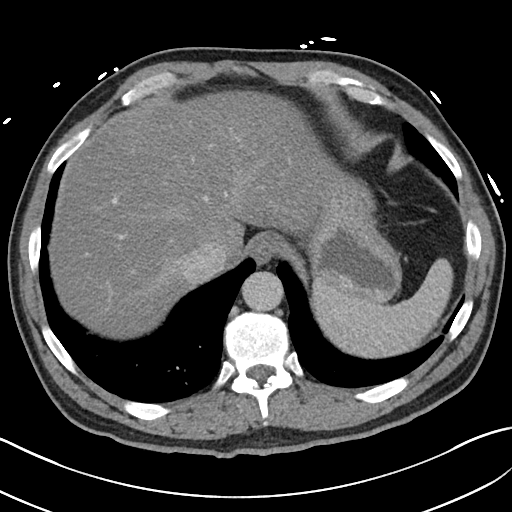
[im 93/100  soft-tissue]
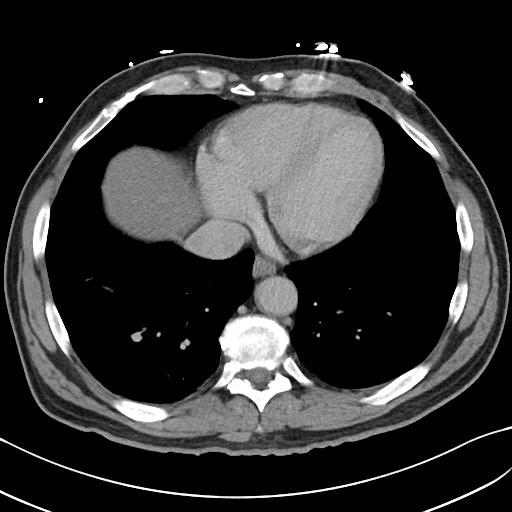

[Series 6: abdomen 3.0 mpr cor · coronal · 0.75mm/px · 3 of 85 slices shown]
[im 29/85  soft-tissue]
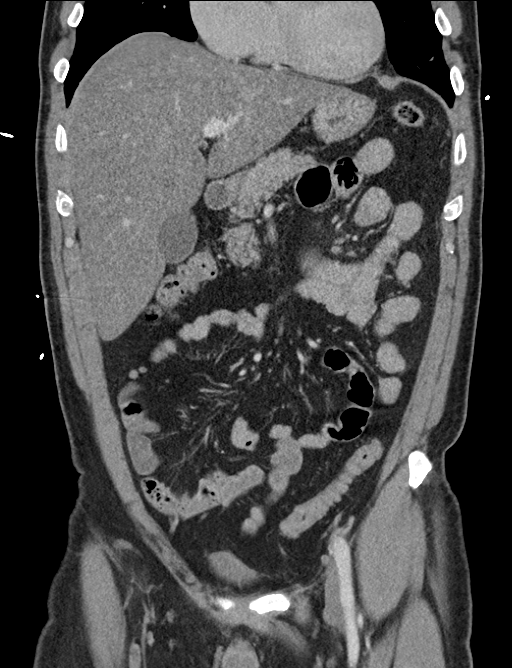
[im 38/85  soft-tissue]
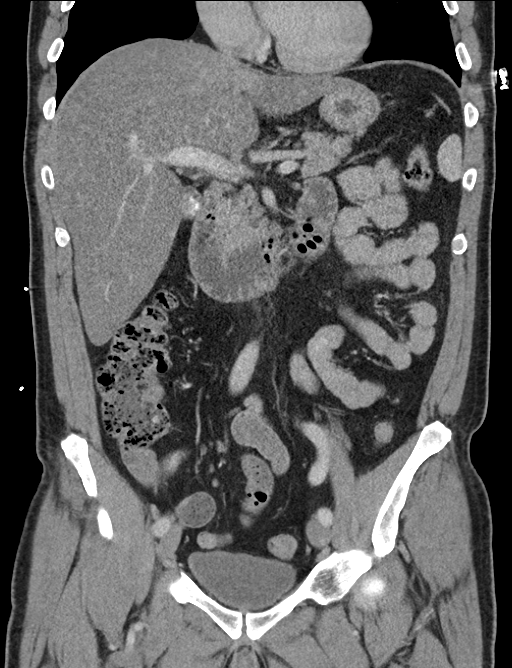
[im 47/85  soft-tissue]
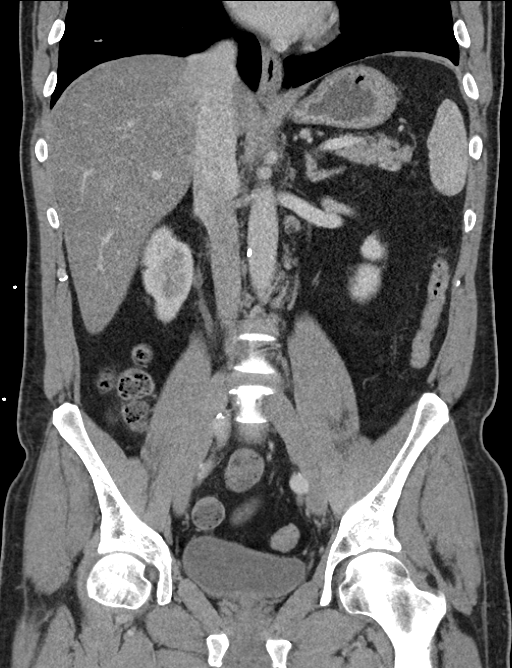

[16 of 46 positions shown; findings below may reference images not displayed]

FINDINGS: Lower chest: Atelectasis of the lung bases.

Hepatobiliary: Decreased attenuation of liver parenchyma. Cranial
caudal span of the right liver measures 22 cm.

Cholelithiasis without evidence of inflammatory changes.

Pancreas: Unremarkable pancreas

Spleen: Unremarkable spleen

Adrenals/Urinary Tract: Re- demonstration of left adrenal lesion
previously characterized as adenoma.

Unremarkable right adrenal gland.

Mild right-sided pelvicaliectasis. Nonobstructive stones in the
right collecting system measuring 3 mm or less.

Mild dilation along the length of the right ureter with associated
inflammatory changes. 2 mm -3 mm stone at the right ureterovesical
junction.

Left kidney unremarkable with no hydronephrosis. No nephrolithiasis.
Unremarkable course of the left ureter.

Urinary bladder partially distended.

Stomach/Bowel: Unremarkable stomach, small bowel without abnormal
distention. No transition point.

Normal appendix.

Mild stool burden. Minimal diverticular change without associated
inflammation.

Vascular/Lymphatic: Scattered calcifications of the abdominal aorta.
No aneurysm. Mesenteric and renal vessels are patent. Iliac and
proximal femoral vessels are patent.

Reproductive: Unremarkable.

Other: Small fat containing umbilical hernia

Musculoskeletal: No displaced fracture. Minimal degenerative changes
of the spine.
IMPRESSION: Small obstructing right ureteral stone at the ureterovesical
junction. There is mild right-sided pelvicaliectasis. If there is
concern for urinary tract infection, recommend correlation with
urinalysis.

Additional nonobstructive stones within the right collecting system.

Cholelithiasis without evidence of acute cholecystitis.

Liver steatosis.

## 2018-07-10 ENCOUNTER — Other Ambulatory Visit: Payer: Self-pay | Admitting: Internal Medicine

## 2018-07-10 DIAGNOSIS — E782 Mixed hyperlipidemia: Secondary | ICD-10-CM

## 2018-07-14 NOTE — Progress Notes (Deleted)
No chief complaint on file.   History of Present Illness: 62 yo Sean [name] with history of DM who is here today for follow up. I saw him as a new patient 03/11/17 for evaluation of abnormal cardiac monitor. He had an EKG in June 2018 with sinus/PVCs with bigeminy. He then wore a cardiac monitor in June 2018 and was found to have several episodes of non-sustained ventricular tachycardia. Baseline rhythm was sinus. He was also noted to have sinus bradycardia with lowest heart rate 41 bpm. Rare PACs. He was started on atenolol and tolerated this well. Prior to the addition of the beta blocker, he was having frequent episodes of "fluttering" in his chest. No prior cardiac issues. Echo 03/19/17 with normal LV size and systolic function. Grade 2 diastolic dysfunction noted. No valve disease.   He is here today for follow up. The patient denies any chest pain, dyspnea, palpitations, lower extremity edema, orthopnea, PND, dizziness, near syncope or syncope.   Primary Care Physician: Lucky Cowboy, MD   Past Medical History:  Diagnosis Date  . Calculus of gallbladder with acute cholecystitis 11/08/2014   Via CT AB 2015   . Diabetes mellitus without complication (HCC)   . Fatty liver disease, nonalcoholic 11/08/2014  . Gout 07/17/2014  . H/O cluster headache   . History of nephrolithiasis 11/08/2014  . 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 Vitamin D deficiency     Past Surgical History:  Procedure Laterality Date  . NASAL SINUS SURGERY    . TOE SURGERY    . VARICOCELE EXCISION      Current Outpatient Medications  Medication Sig Dispense Refill  . allopurinol (ZYLOPRIM) 300 MG tablet TAKE 1 TABLET DAILY TO PREVENT GOUT 90 tablet 1  . aspirin EC 81 MG tablet Take 81 mg by mouth at bedtime.    Marland Kitchen atenolol (TENORMIN) 50 MG tablet Take 1 tablet daily to Prevent Heart Arrhythmia 90 tablet 1  . B Complex-C (SUPER B COMPLEX PO) Take 1 tablet by mouth daily.    . fenofibrate micronized (LOFIBRA) 134 MG  capsule TAKE 1 CAPSULE DAILY TO PREVENT GOUT 90 capsule 1  . metFORMIN (GLUCOPHAGE-XR) 500 MG 24 hr tablet TAKE 2 TABLETS TWICE A DAY 360 tablet 4  . montelukast (SINGULAIR) 10 MG tablet TAKE 1 TABLET DAILY FOR ALLERGIES 90 tablet 1  . Omega-3 Fatty Acids (FISH OIL) 1000 MG CAPS Take 1,000 mg by mouth 2 (two) times daily.     Marland Kitchen OVER THE COUNTER MEDICATION Takes OTC Advil PRN for headache    . triamcinolone ointment (KENALOG) 0.1 % Apply 1 application topically 2 (two) times daily. 80 g 1  . Vitamin D, Cholecalciferol, 1000 UNITS TABS Take 1,000 mg by mouth 2 (two) times daily.      No current facility-administered medications for this visit.     Allergies  Allergen Reactions  . Levaquin [Levofloxacin]     GI Upset  . Valtrex [Valacyclovir Hcl] Hives    Social History   Socioeconomic History  . Marital status: Married    Spouse name: Not on file  . Number of children: 1  . Years of education: Not on file  . Highest education level: Not on file  Occupational History  . Occupation: Biochemist, clinical  Social Needs  . Financial resource strain: Not on file  . Food insecurity:    Worry: Not on file    Inability: Not on file  . Transportation needs:    Medical: Not  on file    Non-medical: Not on file  Tobacco Use  . Smoking status: Never Smoker  . Smokeless tobacco: Never Used  Substance and Sexual Activity  . Alcohol use: No  . Drug use: No  . Sexual activity: Not on file  Lifestyle  . Physical activity:    Days per week: Not on file    Minutes per session: Not on file  . Stress: Not on file  Relationships  . Social connections:    Talks on phone: Not on file    Gets together: Not on file    Attends religious service: Not on file    Active member of club or organization: Not on file    Attends meetings of clubs or organizations: Not on file    Relationship status: Not on file  . Intimate partner violence:    Fear of current or ex partner: Not on file     Emotionally abused: Not on file    Physically abused: Not on file    Forced sexual activity: Not on file  Other Topics Concern  . Not on file  Social History Narrative  . Not on file    Family History  Problem Relation Age of Onset  . Hypertension Father   . Diabetes Father   . Heart attack Father 109    Review of Systems:  As stated in the HPI and otherwise negative.   There were no vitals taken for this visit.  Physical Examination:  General: Well developed, well nourished, NAD  HEENT: OP clear, mucus membranes moist  SKIN: warm, dry. No rashes. Neuro: No focal deficits  Musculoskeletal: Muscle strength 5/5 all ext  Psychiatric: Mood and affect normal  Neck: No JVD, no carotid bruits, no thyromegaly, no lymphadenopathy.  Lungs:Clear bilaterally, no wheezes, rhonci, crackles Cardiovascular: Regular rate and rhythm. No murmurs, gallops or rubs. Abdomen:Soft. Bowel sounds present. Non-tender.  Extremities: No lower extremity edema. Pulses are 2 + in the bilateral DP/PT.  Echo 03/19/17: Left ventricle: The cavity size was normal. Wall thickness was   increased in a pattern of moderate LVH. Systolic function was   normal. The estimated ejection fraction was in the range of 55%   to 60%. Wall motion was normal; there were no regional wall   motion abnormalities. Features are consistent with a pseudonormal   left ventricular filling pattern, with concomitant abnormal   relaxation and increased filling pressure (grade 2 diastolic   dysfunction).   EKG:  EKG is not  *** ordered today. The ekg ordered today demonstrates   Recent Labs: 06/08/2018: ALT 16; BUN 23; Creat 1.01; Hemoglobin 12.0; Magnesium 1.4; Platelets 265; Potassium 4.2; Sodium 139; TSH 1.07   Lipid Panel    Component Value Date/Time   CHOL 115 06/08/2018 1526   TRIG 99 06/08/2018 1526   HDL 36 (L) 06/08/2018 1526   CHOLHDL 3.2 06/08/2018 1526   VLDL 34 (H) 02/02/2017 0938   LDLCALC 61 06/08/2018 1526      Wt Readings from Last 3 Encounters:  06/08/18 220 lb 9.6 oz (100.1 kg)  01/06/18 224 lb 12.8 oz (102 kg)  09/13/17 228 lb 9.6 oz (103.7 kg)     Other studies Reviewed: Additional studies/ records that were reviewed today include:  Review of the above records demonstrates:   Assessment and Plan:   1. Non-sustained VT: He had palpitations and NSVT documented on outpatient cardiac monitor in 2018. Echo in 2018 with normal LV function. He has no further  palpitations on the beta blocker. Continue beta blocker.   Current medicines are reviewed at length with the patient today.  The patient does not have concerns regarding medicines.  The following changes have been made:  no change  Labs/ tests ordered today include:   No orders of the defined types were placed in this encounter.    Disposition:   FU with me in 12 months   Signed, Verne Carrowhristopher McAlhany, MD 07/14/2018 5:55 PM    [facility] [address], [place], [place]  [address] Phone: [phone]; Fax: [phone]

## 2018-07-15 ENCOUNTER — Ambulatory Visit: Payer: BLUE CROSS/BLUE SHIELD | Admitting: Cardiovascular Disease

## 2018-07-15 DIAGNOSIS — R0989 Other specified symptoms and signs involving the circulatory and respiratory systems: Secondary | ICD-10-CM

## 2018-07-19 ENCOUNTER — Encounter: Payer: Self-pay | Admitting: Cardiovascular Disease

## 2018-09-29 ENCOUNTER — Other Ambulatory Visit: Payer: Self-pay | Admitting: Internal Medicine

## 2018-10-06 ENCOUNTER — Ambulatory Visit: Payer: Self-pay | Admitting: Physician Assistant

## 2018-11-03 ENCOUNTER — Other Ambulatory Visit: Payer: Self-pay | Admitting: Internal Medicine

## 2018-11-03 DIAGNOSIS — M1000 Idiopathic gout, unspecified site: Secondary | ICD-10-CM

## 2018-11-21 ENCOUNTER — Other Ambulatory Visit: Payer: Self-pay | Admitting: Internal Medicine

## 2018-11-21 DIAGNOSIS — I472 Ventricular tachycardia, unspecified: Secondary | ICD-10-CM

## 2018-11-23 ENCOUNTER — Encounter: Payer: Self-pay | Admitting: Physician Assistant

## 2018-11-23 ENCOUNTER — Telehealth: Payer: BLUE CROSS/BLUE SHIELD | Admitting: Physician Assistant

## 2018-11-23 ENCOUNTER — Other Ambulatory Visit: Payer: Self-pay

## 2018-11-23 VITALS — Wt 224.0 lb

## 2018-11-23 DIAGNOSIS — E782 Mixed hyperlipidemia: Secondary | ICD-10-CM

## 2018-11-23 DIAGNOSIS — N200 Calculus of kidney: Secondary | ICD-10-CM

## 2018-11-23 DIAGNOSIS — K760 Fatty (change of) liver, not elsewhere classified: Secondary | ICD-10-CM

## 2018-11-23 DIAGNOSIS — I10 Essential (primary) hypertension: Secondary | ICD-10-CM

## 2018-11-23 DIAGNOSIS — N182 Chronic kidney disease, stage 2 (mild): Secondary | ICD-10-CM

## 2018-11-23 DIAGNOSIS — E1122 Type 2 diabetes mellitus with diabetic chronic kidney disease: Secondary | ICD-10-CM

## 2018-11-23 DIAGNOSIS — R509 Fever, unspecified: Secondary | ICD-10-CM

## 2018-11-23 MED ORDER — HYDROCODONE-ACETAMINOPHEN 5-325 MG PO TABS: 1.0000 | ORAL_TABLET | Freq: Four times a day (QID) | ORAL | 0 refills | Status: DC | PRN

## 2018-11-23 MED ORDER — SULFAMETHOXAZOLE-TRIMETHOPRIM 800-160 MG PO TABS: 1.0000 | ORAL_TABLET | Freq: Two times a day (BID) | ORAL | 0 refills | Status: DC

## 2018-11-23 MED ORDER — TAMSULOSIN HCL 0.4 MG PO CAPS: 0.4000 mg | ORAL_CAPSULE | Freq: Every day | ORAL | 0 refills | Status: DC

## 2018-11-23 MED ORDER — HYDROCODONE-ACETAMINOPHEN 5-325 MG PO TABS: 1.0000 | ORAL_TABLET | Freq: Four times a day (QID) | ORAL | 0 refills | Status: AC | PRN

## 2018-11-23 NOTE — Patient Instructions (Signed)
I'm sorry that you are not feeling well at this time.  Symptoms with COVID19 generally have cough and fever, the symptoms you are describing do not meet criteria for testing.  Currently, not all patients are being tested.  We are testing patients that are high risk for clinical severity and you do not meet that criteria.   If the symptoms are mild, we are NOT testing patients to conserve supplies and capacity so our health care workers can care for people who need medical attention even during the peak of the outbreak.  Patients with mild symptoms are being instructed to stay at home and recover for 2 weeks.  You can stay in contact with [place] during that time by mychart, telephone calls or we are starting virtual office visits with video capability.   Mild symptoms include cough and fever WITHOUT ay of the following symptoms: Difficulty breathing, chest discomfort, altered thinking and confusion.  Please notify us immediately if you have these symptoms.    So please stay at home for 2 weeks.  Monitor your temperature twice a day.  You may also take acetaminophen (Tylenol) as needed for fever, if you develop this. Stay hydrated and rest.    Coronavirus disease 2019 (COVID-19) is a respiratory illness that can spread from person to person. The virus that causes COVID-19 is a new virus that was first identified in the country of [place] but is now found in multiple other countries and has spread to the [place].  Symptoms associated with the virus are mild to severe fever, cough, and shortness of breath. There is currently no vaccine to protect against COVID-19, and there is no specific antiviral treatment for the virus.    It is vitally important that if you feel that you have an infection such as this virus or any other virus that you stay home and away from places where you may spread it to others.  You should self-quarantine for 14 days if you have symptoms that could potentially be  coronavirus and avoid contact with people age 18 and older.        Reduce your risk of any infection by using the same precautions used for avoiding the common cold or flu:   Wash your hands often with soap and warm water for at least 20 seconds.  If soap and water are not readily available, use an alcohol-based hand sanitizer with at least 60% alcohol.   If coughing or sneezing, cover your mouth and nose by coughing or sneezing into the elbow areas of your shirt or coat, into a tissue or into your sleeve (not your hands).  Avoid shaking hands with others and consider head nods or verbal greetings only.  Avoid touching your eyes, nose, or mouth with unwashed hands.   Avoid close contact with people who are sick.  Avoid places or events with large numbers of people in one location, like concerts or sporting events.  Carefully consider travel plans you have or are making.  If you are planning any travel outside or inside the [place], visit the CDC's Travelers' Health webpage for the latest health notices.  If you have some symptoms but not all symptoms, continue to monitor at home and seek medical attention if your symptoms worsen.  If you are having a medical emergency, call 911.   HOME CARE  Only take medications as instructed by your medical team.  Drink plenty of fluids and get plenty of rest.  A steam or ultrasonic  humidifier can help if you have congestion.    GET HELP RIGHT AWAY IF:  You develop worsening fever.  You become short of breath  You cough up blood.  Your symptoms become more severe MAKE SURE YOU   Understand these instructions.  Will watch your condition.  Will get help right away if you are not doing well or get worse.    Kidney Stones  Kidney stones (urolithiasis) are solid, rock-like deposits that form inside of the organs that make urine (kidneys). A kidney stone may form in a kidney and move into the bladder, where it can cause intense pain and  block the flow of urine. Kidney stones are created when high levels of certain minerals are found in the urine. They are usually passed through urination, but in some cases, medical treatment may be needed to remove them. What are the causes? Kidney stones may be caused by:  A condition in which certain glands produce too much parathyroid hormone (primary hyperparathyroidism), which causes too much calcium buildup in the blood.  Buildup of uric acid crystals in the bladder (hyperuricosuria). Uric acid is a chemical that the body produces when you eat certain foods. It usually exits the body in the urine.  Narrowing (stricture) of one or both of the tubes that drain urine from the kidneys to the bladder (ureters).  A kidney blockage that is present at birth (congenital obstruction).  Past surgery on the kidney or the ureters, such as gastric bypass surgery. What increases the risk? The following factors make you more likely to develop kidney stones:  Having had a kidney stone in the past.  Having a family history of kidney stones.  Not drinking enough water.  Eating a diet that is high in protein, salt (sodium), or sugar.  Being overweight or obese. What are the signs or symptoms? Symptoms of a kidney stone may include:  Nausea.  Vomiting.  Blood in the urine (hematuria).  Pain in the side of the abdomen, right below the ribs (flank pain). Pain usually spreads (radiates) to the groin.  Needing to urinate frequently or urgently. How is this diagnosed? This condition may be diagnosed based on:  Your medical history.  A physical exam.  Blood tests.  Urine tests.  CT scan.  Abdominal X-ray.  A procedure to examine the inside of the bladder (cystoscopy). How is this treated? Treatment for kidney stones depends on the size, location, and makeup of the stones. Treatment may involve:  Analyzing your urine before and after you pass the stone through urination.  Being  monitored at the hospital until you pass the stone through urination.  Increasing your fluid intake and decreasing the amount of calcium and protein in your diet.  A procedure to break up kidney stones in the bladder using: ? A focused beam of light (laser therapy). ? Shock waves (extracorporeal shock wave lithotripsy).  Surgery to remove kidney stones. This may be needed if you have severe pain or have stones that block your urinary tract. Follow these instructions at home: Eating and drinking  Drink enough fluid to keep your urine clear or pale yellow. This will help you to pass the kidney stone.  If directed, change your diet. This may include: ? Limiting how much sodium you eat. ? Eating more fruits and vegetables. ? Limiting how much meat, poultry, fish, and eggs you eat.  Follow instructions from your health care provider about eating or drinking restrictions. General instructions  Collect urine samples  as told by your health care provider. You may need to collect a urine sample: ? 24 hours after you pass the stone. ? 8-12 weeks after passing the kidney stone, and every 6-12 months after that.  Strain your urine every time you urinate, for as long as directed. Use the strainer that your health care provider recommends.  Do not throw out the kidney stone after passing it. Keep the stone so it can be tested by your health care provider. Testing the makeup of your kidney stone may help prevent you from getting kidney stones in the future.  Take over-the-counter and prescription medicines only as told by your health care provider.  Keep all follow-up visits as told by your health care provider. This is important. You may need follow-up X-rays or ultrasounds to make sure that your stone has passed. How is this prevented? To prevent another kidney stone:  Drink enough fluid to keep your urine clear or pale yellow. This is the best way to prevent kidney stones.  Eat a healthy  diet and follow recommendations from your health care provider about foods to avoid. You may be instructed to eat a low-protein diet. Recommendations vary depending on the type of kidney stone that you have.  Maintain a healthy weight. Contact a health care provider if:  You have pain that gets worse or does not get better with medicine. Get help right away if:  You have a fever or chills.  You develop severe pain.  You develop new abdominal pain.  You faint.  You are unable to urinate. This information is not intended to replace advice given to you by your health care provider. Make sure you discuss any questions you have with your health care provider. Document Released: 08/17/2005 Document Revised: 01/28/2017 Document Reviewed: 01/31/2016 Elsevier Interactive Patient Education  2019 [employer].

## 2018-11-23 NOTE — Progress Notes (Signed)
Assessment and Plan:    Essential hypertension - continue medications, DASH diet, exercise and monitor at home. Call if greater than 130/80.  -     CBC with Differential/Platelet -     BASIC METABOLIC PANEL WITH GFR -     TSH  Fatty liver disease, nonalcoholic Weight loss advised, avoid alcohol/tylenol  Type 2 diabetes mellitus with stage 2 chronic kidney disease, without long-term current use of insulin (HCC) Continue to monitor sugars at home, will call if sugars increase Discussed general issues about diabetes pathophysiology and management., Educational material distributed., Suggested low cholesterol diet., Encouraged aerobic exercise., Discussed foot care., Reminded to get yearly retinal exam.  Mixed hyperlipidemia -continue medications,  decrease fatty foods, increase activity.   Fever He has no travel history and no possible exposure to COVID 19 patient. No SOB, cough, etc Feels similar to kidney stone before Medications: TMP/SMX. Maintain adequate hydration. Follow up if symptoms not improving, and as needed. WILL BE OUT OF WORK FOR AT LEAST 3 DAYS WITHOUT FEVER, AND NEED TO NOT HAVE SYMPTOMS. 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 WILL SEND IN NOTE TO REMAIN OUT OF WORK UNTIL 03/30.  WILL CONTACT PATIENT Sunday.  Kidney stone -     tamsulosin (FLOMAX) 0.4 MG CAPS capsule; Take 1 capsule (0.4 mg total) by mouth daily after supper. -     sulfamethoxazole-trimethoprim (BACTRIM DS,SEPTRA DS) 800-160 MG tablet; Take 1 tablet by mouth 2 (two) times daily. -     Discontinue: HYDROcodone-acetaminophen (NORCO) 5-325 MG tablet; Take 1 tablet by mouth every 6 (six) hours as needed for up to 5 days for severe pain. -     HYDROcodone-acetaminophen (NORCO) 5-325 MG tablet; Take 1 tablet by mouth every 6 (six) hours as needed for up to 5 days for severe pain.  The patient was advised to call immediately if he has any concerning symptoms in the interval. The patient voices  understanding of current treatment options and is in agreement with the current care plan.The patient knows to call the clinic with any problems, questions or concerns or go to the ER if any further progression of symptoms.   Continue diet and meds as discussed. Further disposition pending results of labs. Discussed med's effects and SE's.    HPI 63 y.o. male  presents for 3 month follow up HTN, Chol, DM with CKD, and vitamin D def.  Patient states last night had fever, chills, urinary frequency,    His blood pressure has been controlled at home, today his BP is  .  He does not workout. He denies chest pain, shortness of breath, dizziness.  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 Clifton James.  He is on cholesterol medication, fenofibrate/lipitor and denies myalgias. His cholesterol is at goal. The cholesterol was:  06/08/2018: Cholesterol 115; HDL 36; LDL Cholesterol (Calc) 61; Triglycerides 99  He has been working on diet and exercise for diabetes with diabetic chronic kidney disease, he is on bASA, he is not on ACE/ARB due to hypotension, MF 2000mg  and cinnamon 1000mg  daliy, this AM was 120-130, no hypoglycemia, and denies  paresthesia of the feet, polydipsia, polyuria and visual disturbances. Last A1C was:  Lab Results  Component Value Date   HGBA1C 7.0 (H) 06/08/2018    Lab Results  Component Value Date   GFRNONAA 78 06/08/2018   Patient is on Vitamin D supplement. 06/08/2018: Vit D, 25-Hydroxy 39 He sees Dr. Nickola Major for gout and is on allopurinol no  flares.  Lab Results  Component Value Date   LABURIC 4.8 06/08/2018   BMI is Body mass index is 27.63 kg/m., he is working on diet and exercise. Wt Readings from Last 3 Encounters:  11/23/18 224 lb (101.6 kg)  06/08/18 220 lb 9.6 oz (100.1 kg)  01/06/18 224 lb 12.8 oz (102 kg)    Current Medications:  Current Outpatient Medications on File Prior to Visit  Medication Sig Dispense Refill  . allopurinol (ZYLOPRIM) 300  MG tablet TAKE 1 TABLET DAILY TO PREVENT GOUT 90 tablet 4  . aspirin EC 81 MG tablet Take 81 mg by mouth at bedtime.    Marland Kitchen atenolol (TENORMIN) 50 MG tablet TAKE 1 TABLET DAILY TO PREVENT HEART ARRHYTHMIA 90 tablet 3  . B Complex-C (SUPER B COMPLEX PO) Take 1 tablet by mouth daily.    . fenofibrate micronized (LOFIBRA) 134 MG capsule TAKE 1 CAPSULE DAILY TO PREVENT GOUT 90 capsule 1  . metFORMIN (GLUCOPHAGE-XR) 500 MG 24 hr tablet TAKE 2 TABLETS TWICE A DAY 360 tablet 4  . montelukast (SINGULAIR) 10 MG tablet TAKE 1 TABLET DAILY FOR ALLERGIES 90 tablet 1  . Omega-3 Fatty Acids (FISH OIL) 1000 MG CAPS Take 1,000 mg by mouth 2 (two) times daily.     . Vitamin D, Cholecalciferol, 1000 UNITS TABS Take 1,000 mg by mouth 2 (two) times daily.     Marland Kitchen OVER THE COUNTER MEDICATION Takes OTC Advil PRN for headache    . triamcinolone ointment (KENALOG) 0.1 % Apply 1 application topically 2 (two) times daily. (Patient not taking: Reported on 11/23/2018) 80 g 1   No current facility-administered medications on file prior to visit.    Medical History:  Past Medical History:  Diagnosis Date  . Calculus of gallbladder with acute cholecystitis 11/08/2014   Via CT AB 2015   . Diabetes mellitus without complication (HCC)   . Fatty liver disease, nonalcoholic 11/08/2014  . Gout 07/17/2014  . H/O cluster headache   . History of nephrolithiasis 11/08/2014  . 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 Vitamin D deficiency    Allergies:  Allergies  Allergen Reactions  . Levaquin [Levofloxacin]     GI Upset  . Valtrex [Valacyclovir Hcl] Hives     Review of Systems:  Review of Systems  Constitutional: Negative.   HENT: Negative.   Eyes: Negative.   Respiratory: Negative.   Cardiovascular: Negative.   Gastrointestinal: Negative.   Genitourinary: Negative.   Musculoskeletal: Negative.   Skin: Negative.   Neurological: Negative.   Endo/Heme/Allergies: Negative.   Psychiatric/Behavioral: Negative.     Family  history- Review and unchanged Social history- Review and unchanged Physical Exam: Wt 224 lb (101.6 kg)   BMI 27.63 kg/m  Wt Readings from Last 3 Encounters:  11/23/18 224 lb (101.6 kg)  06/08/18 220 lb 9.6 oz (100.1 kg)  01/06/18 224 lb 12.8 oz (102 kg)   General Appearance: Well nourished, in no apparent distress. Respiratory: Respiratory effort normal, no retractions.  Cardio: no edema  Musculoskeletal: Full ROM  Neuro: No defecits  Psych: Awake and oriented X 3, normal affect, Insight and Judgment appropriate.    Sean Mulling, PA-C 1:56 PM [facility]

## 2018-12-07 ENCOUNTER — Other Ambulatory Visit: Payer: Self-pay | Admitting: Physician Assistant

## 2018-12-07 DIAGNOSIS — N200 Calculus of kidney: Secondary | ICD-10-CM

## 2018-12-08 MED ORDER — TAMSULOSIN HCL 0.4 MG PO CAPS: ORAL_CAPSULE | ORAL | 1 refills | Status: DC

## 2018-12-08 NOTE — Addendum Note (Signed)
Addended by: Quentin Mulling R on: 12/08/2018 09:20 AM   Modules accepted: Orders

## 2019-01-04 ENCOUNTER — Other Ambulatory Visit: Payer: Self-pay | Admitting: Internal Medicine

## 2019-01-04 ENCOUNTER — Ambulatory Visit: Payer: Self-pay | Admitting: Internal Medicine

## 2019-01-04 ENCOUNTER — Encounter: Payer: Self-pay | Admitting: Internal Medicine

## 2019-01-04 DIAGNOSIS — E782 Mixed hyperlipidemia: Secondary | ICD-10-CM

## 2019-01-04 NOTE — Progress Notes (Signed)
   N  O      S  H  O  W                                                                                                                                                                        This very nice 63 y.o. MWM presents for 6 month follow up with HTN, HLD, Pre-Diabetes and Vitamin D Deficiency. Gout predating since 2007 is controlled on Allopurinol.       Patient is followed for Labile  HTN (2010) & BP has been controlled at home. Today's  . Patient is also followed by Dr Sanjuana Kava for hx/o pSVT. Patient has had no complaints of any cardiac type chest pain, palpitations, dyspnea / orthopnea / PND, dizziness, claudication, or dependent edema.      Hyperlipidemia is controlled with diet & fenofibrate.. Patient denies myalgias or other med SE's. Last Lipids were at goal: Lab Results  Component Value Date   CHOL 115 06/08/2018   HDL 36 (L) 06/08/2018   LDLCALC 61 06/08/2018   TRIG 99 06/08/2018   CHOLHDL 3.2 06/08/2018        Also, the patient has history of T2_NIDDM (2013) on Metformin and has had no symptoms of reactive hypoglycemia, diabetic polys, paresthesias or visual blurring.  Last A1c was not at goal: Lab Results  Component Value Date   HGBA1C 7.0 (H) 06/08/2018      Further, the patient also has history of Vitamin D Deficiency ("34" / 2012) and supplements vitamin D without any suspected side-effects. Last vitamin D was still low (goal 70-100): Lab Results  Component Value Date   VD25OH 39 06/08/2018

## 2019-01-18 ENCOUNTER — Encounter: Payer: Self-pay | Admitting: Internal Medicine

## 2019-01-18 NOTE — Patient Instructions (Signed)

## 2019-01-18 NOTE — Progress Notes (Signed)
History of Present Illness:      This very nice 63 y.o. MWM presents for 6 month follow up with HTN, HLD, T2_DM and Vitamin D Deficiency.  Patient's Gout is controlled on his meds.      Patient is followed expectantly with labile HTN & BP has been controlled and today's BP is at goal - 124/76.  Patient also is followed by Dr Sanjuana KavaMcAlhaney for hx/o pSVT. Patient has had no complaints of any cardiac type chest pain, palpitations, dyspnea / orthopnea / PND, dizziness, claudication, or dependent edema.      Hyperlipidemia is controlled with diet & meds. Patient denies myalgias or other med SE's. Last Lipids were at goal: Lab Results  Component Value Date   CHOL 115 06/08/2018   HDL 36 (L) 06/08/2018   LDLCALC 61 06/08/2018   TRIG 99 06/08/2018   CHOLHDL 3.2 06/08/2018       Also, the patient has history of T2_NIDDM since 2013 and has had no symptoms of reactive hypoglycemia, diabetic polys, paresthesias or visual blurring.  Last A1c was not at goal: Lab Results  Component Value Date   HGBA1C 7.0 (H) 06/08/2018      Further, the patient also has history of Vitamin D Deficiency ("34" / 2012) and supplements vitamin D without any suspected side-effects. Last vitamin D was low (goal 70-100): Lab Results  Component Value Date   VD25OH 39 06/08/2018   Current Outpatient Medications on File Prior to Visit  Medication Sig  . allopurinol (ZYLOPRIM) 300 MG tablet TAKE 1 TABLET DAILY TO PREVENT GOUT  . aspirin EC 81 MG tablet Take 81 mg by mouth at bedtime.  Marland Kitchen. atenolol (TENORMIN) 50 MG tablet TAKE 1 TABLET DAILY TO PREVENT HEART ARRHYTHMIA  . B Complex-C (SUPER B COMPLEX PO) Take 1 tablet by mouth daily.  . fenofibrate micronized (LOFIBRA) 134 MG capsule TAKE 1 CAPSULE DAILY TO PREVENT GOUT  . metFORMIN (GLUCOPHAGE-XR) 500 MG 24 hr tablet TAKE 2 TABLETS TWICE A DAY  . montelukast (SINGULAIR) 10 MG tablet TAKE 1 TABLET DAILY FOR ALLERGIES  . Omega-3 Fatty Acids (FISH OIL) 1000 MG CAPS Take 1,000  mg by mouth 2 (two) times daily.   Marland Kitchen. OVER THE COUNTER MEDICATION Takes OTC Advil PRN for headache  . triamcinolone ointment (KENALOG) 0.1 % Apply 1 application topically 2 (two) times daily.  . Vitamin D, Cholecalciferol, 1000 UNITS TABS Take 1,000 mg by mouth 2 (two) times daily.    No current facility-administered medications on file prior to visit.    Allergies  Allergen Reactions  . Levaquin [Levofloxacin]     GI Upset  . Valtrex [Valacyclovir Hcl] Hives   PMHx:   Past Medical History:  Diagnosis Date  . Calculus of gallbladder with acute cholecystitis 11/08/2014   Via CT AB 2015   . Diabetes mellitus without complication (HCC)   . Fatty liver disease, nonalcoholic 11/08/2014  . Gout 07/17/2014  . H/O cluster headache   . History of nephrolithiasis 11/08/2014  . 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 Vitamin D deficiency    Immunization History  Administered Date(s) Administered  . Influenza Inj Mdck Quad With Preservative 05/20/2017, 06/08/2018  . Influenza Split 05/15/2013, 07/17/2014, 06/18/2015  . Influenza, Seasonal, Injecte, Preservative Fre 09/08/2016  . PPD Test 07/17/2014, 09/30/2015, 02/02/2017, 06/08/2018  . Pneumococcal Polysaccharide-23 02/07/2016  . Tdap 09/30/2015   Past Surgical History:  Procedure Laterality Date  . NASAL SINUS SURGERY    . TOE SURGERY    .  VARICOCELE EXCISION     FHx:    Reviewed / unchanged  SHx:    Reviewed / unchanged   Systems Review:  Constitutional: Denies fever, chills, wt changes, headaches, insomnia, fatigue, night sweats, change in appetite. Eyes: Denies redness, blurred vision, diplopia, discharge, itchy, watery eyes.  ENT: Denies discharge, congestion, post nasal drip, epistaxis, sore throat, earache, hearing loss, dental pain, tinnitus, vertigo, sinus pain, snoring.  CV: Denies chest pain, palpitations, irregular heartbeat, syncope, dyspnea, diaphoresis, orthopnea, PND, claudication or edema. Respiratory: denies cough,  dyspnea, DOE, pleurisy, hoarseness, laryngitis, wheezing.  Gastrointestinal: Denies dysphagia, odynophagia, heartburn, reflux, water brash, abdominal pain or cramps, nausea, vomiting, bloating, diarrhea, constipation, hematemesis, melena, hematochezia  or hemorrhoids. Genitourinary: Denies dysuria, frequency, urgency, nocturia, hesitancy, discharge, hematuria or flank pain. Musculoskeletal: Denies arthralgias, myalgias, stiffness, jt. swelling, pain, limping or strain/sprain.  Skin: Denies pruritus, rash, hives, warts, acne, eczema or change in skin lesion(s). Neuro: No weakness, tremor, incoordination, spasms, paresthesia or pain. Psychiatric: Denies confusion, memory loss or sensory loss. Endo: Denies change in weight, skin or hair change.  Heme/Lymph: No excessive bleeding, bruising or enlarged lymph nodes.  Physical Exam  BP 124/76   Pulse 60   Temp (!) 97.1 F (36.2 C)   Resp 16   Ht 6' 3.5" (1.918 m)   Wt 226 lb (102.5 kg)   BMI 27.88 kg/m   Appears  well nourished, well groomed  and in no distress.  Eyes: PERRLA, EOMs, conjunctiva no swelling or erythema. Sinuses: No frontal/maxillary tenderness ENT/Mouth: EAC's clear, TM's nl w/o erythema, bulging. Nares clear w/o erythema, swelling, exudates. Oropharynx clear without erythema or exudates. Oral hygiene is good. Tongue normal, non obstructing. Hearing intact.  Neck: Supple. Thyroid not palpable. Car 2+/2+ without bruits, nodes or JVD. Chest: Respirations nl with BS clear & equal w/o rales, rhonchi, wheezing or stridor.  Cor: Heart sounds normal w/ regular rate and rhythm without sig. murmurs, gallops, clicks or rubs. Peripheral pulses normal and equal  without edema.  Abdomen: Soft & bowel sounds normal. Non-tender w/o guarding, rebound, hernias, masses or organomegaly.  Lymphatics: Unremarkable.  Musculoskeletal: Full ROM all peripheral extremities, joint stability, 5/5 strength and normal gait.  Skin: Warm, dry without  exposed rashes, lesions or ecchymosis apparent.  Neuro: Cranial nerves intact, reflexes equal bilaterally. Sensory-motor testing grossly intact. Tendon reflexes grossly intact.  Pysch: Alert & oriented x 3.  Insight and judgement nl & appropriate. No ideations.  Assessment and Plan:  1. Essential hypertension  - Continue medication, monitor blood pressure at home.  - Continue DASH diet.  Reminder to go to the ER if any CP,  SOB, nausea, dizziness, severe HA, changes vision/speech.  - CBC with Differential/Platelet - COMPLETE METABOLIC PANEL WITH GFR - Magnesium - TSH  2. Hyperlipidemia, mixed  - Continue diet/meds, exercise,& lifestyle modifications.  - Continue monitor periodic cholesterol/liver & renal functions   - Lipid panel - TSH  3. Type 2 diabetes mellitus with stage 2 chronic kidney disease, without long-term current use of insulin (HCC)  - Continue diet, exercise  - Lifestyle modifications.  - Monitor appropriate labs.  - Hemoglobin A1c - Insulin, random  4. Vitamin D deficiency  - Continue supplementation.  - VITAMIN D 25 Hydroxyl  5. Idiopathic gout  - Uric acid  6. Medication management  - CBC with Differential/Platelet - COMPLETE METABOLIC PANEL WITH GFR - Magnesium - Lipid panel - TSH - Hemoglobin A1c - Insulin, random - VITAMIN D 25 Hydroxyl - Uric acid  Discussed  regular exercise, BP monitoring, weight control to achieve/maintain BMI less than 25 and discussed med and SE's. Recommended labs to assess and monitor clinical status with further disposition pending results of labs. I discussed the assessment and treatment plan with the patient. The patient was provided an opportunity to ask questions and all were answered. The patient agreed with the plan and demonstrated an understanding of the instructions. I provided  minutes of non-face-to-face time during this encounter and over 40 minutes of exam, counseling, chart review and  complex  critical decision making was performed    Marinus Maw, MD

## 2019-01-19 ENCOUNTER — Other Ambulatory Visit: Payer: Self-pay

## 2019-01-19 ENCOUNTER — Ambulatory Visit: Payer: BLUE CROSS/BLUE SHIELD | Admitting: Internal Medicine

## 2019-01-19 VITALS — BP 124/76 | HR 60 | Temp 97.1°F | Resp 16 | Ht 75.5 in | Wt 226.0 lb

## 2019-01-19 DIAGNOSIS — Z79899 Other long term (current) drug therapy: Secondary | ICD-10-CM | POA: Diagnosis not present

## 2019-01-19 DIAGNOSIS — E559 Vitamin D deficiency, unspecified: Secondary | ICD-10-CM

## 2019-01-19 DIAGNOSIS — M1000 Idiopathic gout, unspecified site: Secondary | ICD-10-CM

## 2019-01-19 DIAGNOSIS — N182 Chronic kidney disease, stage 2 (mild): Secondary | ICD-10-CM

## 2019-01-19 DIAGNOSIS — E1122 Type 2 diabetes mellitus with diabetic chronic kidney disease: Secondary | ICD-10-CM

## 2019-01-19 DIAGNOSIS — E782 Mixed hyperlipidemia: Secondary | ICD-10-CM

## 2019-01-19 DIAGNOSIS — I10 Essential (primary) hypertension: Secondary | ICD-10-CM | POA: Diagnosis not present

## 2019-01-20 LAB — COMPLETE METABOLIC PANEL WITH GFR
AG Ratio: 1.8 (calc) (ref 1.0–2.5)
ALT: 19 U/L (ref 9–46)
AST: 17 U/L (ref 10–35)
Albumin: 4.8 g/dL (ref 3.6–5.1)
Alkaline phosphatase (APISO): 63 U/L (ref 35–144)
BUN/Creatinine Ratio: 21 (calc) (ref 6–22)
BUN: 26 mg/dL — ABNORMAL HIGH (ref 7–25)
CO2: 28 mmol/L (ref 20–32)
Calcium: 10.2 mg/dL (ref 8.6–10.3)
Chloride: 103 mmol/L (ref 98–110)
Creat: 1.26 mg/dL — ABNORMAL HIGH (ref 0.70–1.25)
GFR, Est African American: 69 mL/min/{1.73_m2} (ref >=60)
GFR, Est Non African American: 60 mL/min/{1.73_m2} (ref >=60)
Globulin: 2.6 g/dL (calc) (ref 1.9–3.7)
Glucose, Bld: 135 mg/dL — ABNORMAL HIGH (ref 65–99)
Potassium: 4.7 mmol/L (ref 3.5–5.3)
Sodium: 137 mmol/L (ref 135–146)
Total Bilirubin: 0.4 mg/dL (ref 0.2–1.2)
Total Protein: 7.4 g/dL (ref 6.1–8.1)

## 2019-01-20 LAB — URIC ACID: Uric Acid, Serum: 6.3 mg/dL (ref 4.0–8.0)

## 2019-01-20 LAB — CBC WITH DIFFERENTIAL/PLATELET
Absolute Monocytes: 502 cells/uL (ref 200–950)
Basophils Absolute: 38 cells/uL (ref 0–200)
Basophils Relative: 0.5 %
Eosinophils Absolute: 251 cells/uL (ref 15–500)
Eosinophils Relative: 3.3 %
HCT: 42.5 % (ref 38.5–50.0)
Hemoglobin: 13.8 g/dL (ref 13.2–17.1)
Lymphs Abs: 2212 cells/uL (ref 850–3900)
MCH: 28 pg (ref 27.0–33.0)
MCHC: 32.5 g/dL (ref 32.0–36.0)
MCV: 86.2 fL (ref 80.0–100.0)
MPV: 12 fL (ref 7.5–12.5)
Monocytes Relative: 6.6 %
Neutro Abs: 4598 cells/uL (ref 1500–7800)
Neutrophils Relative %: 60.5 %
Platelets: 293 10*3/uL (ref 140–400)
RBC: 4.93 10*6/uL (ref 4.20–5.80)
RDW: 13.3 % (ref 11.0–15.0)
Total Lymphocyte: 29.1 %
WBC: 7.6 10*3/uL (ref 3.8–10.8)

## 2019-01-20 LAB — TSH: TSH: 1.43 mIU/L (ref 0.40–4.50)

## 2019-01-20 LAB — VITAMIN D 25 HYDROXY (VIT D DEFICIENCY, FRACTURES): Vit D, 25-Hydroxy: 47 ng/mL (ref 30–100)

## 2019-01-20 LAB — LIPID PANEL
Cholesterol: 127 mg/dL (ref <200)
HDL: 31 mg/dL — ABNORMAL LOW (ref >=40)
LDL Cholesterol (Calc): 74 mg/dL (calc)
Non-HDL Cholesterol (Calc): 96 mg/dL (calc) (ref <130)
Total CHOL/HDL Ratio: 4.1 (calc) (ref <5.0)
Triglycerides: 138 mg/dL (ref <150)

## 2019-01-20 LAB — HEMOGLOBIN A1C
Hgb A1c MFr Bld: 8.5 % of total Hgb — ABNORMAL HIGH (ref <5.7)
Mean Plasma Glucose: 197 (calc)
eAG (mmol/L): 10.9 (calc)

## 2019-01-20 LAB — INSULIN, RANDOM: Insulin: 7.4 u[IU]/mL

## 2019-01-20 LAB — MAGNESIUM: Magnesium: 1.9 mg/dL (ref 1.5–2.5)

## 2019-01-21 ENCOUNTER — Encounter: Payer: Self-pay | Admitting: Internal Medicine

## 2019-01-30 ENCOUNTER — Encounter: Payer: Self-pay | Admitting: *Deleted

## 2019-03-28 ENCOUNTER — Other Ambulatory Visit: Payer: Self-pay | Admitting: Internal Medicine

## 2019-04-04 ENCOUNTER — Other Ambulatory Visit: Payer: Self-pay | Admitting: Internal Medicine

## 2019-04-04 DIAGNOSIS — E782 Mixed hyperlipidemia: Secondary | ICD-10-CM

## 2019-04-20 NOTE — Progress Notes (Deleted)
Assessment and Plan:    Essential hypertension - continue medications, DASH diet, exercise and monitor at home. Call if greater than 130/80.  -     CBC with Differential/Platelet -     BASIC METABOLIC PANEL WITH GFR -     TSH  Fatty liver disease, nonalcoholic Weight loss advised, avoid alcohol/tylenol  Type 2 diabetes mellitus with stage 2 chronic kidney disease, without long-term current use of insulin (HCC) Continue to monitor sugars at home, will call if sugars increase Discussed general issues about diabetes pathophysiology and management., Educational material distributed., Suggested low cholesterol diet., Encouraged aerobic exercise., Discussed foot care., Reminded to get yearly retinal exam.  Mixed hyperlipidemia -continue medications,  decrease fatty foods, increase activity.   Fever He has no travel history and no possible exposure to COVID 19 patient. No SOB, cough, etc Feels similar to kidney stone before Medications: TMP/SMX. Maintain adequate hydration. Follow up if symptoms not improving, and as needed. WILL BE OUT OF WORK FOR AT LEAST 3 DAYS WITHOUT FEVER, AND NEED TO NOT HAVE SYMPTOMS. 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 WILL SEND IN NOTE TO REMAIN OUT OF WORK UNTIL 03/30.  WILL CONTACT PATIENT Sunday.  Kidney stone -     tamsulosin (FLOMAX) 0.4 MG CAPS capsule; Take 1 capsule (0.4 mg total) by mouth daily after supper. -     sulfamethoxazole-trimethoprim (BACTRIM DS,SEPTRA DS) 800-160 MG tablet; Take 1 tablet by mouth 2 (two) times daily. -     Discontinue: HYDROcodone-acetaminophen (NORCO) 5-325 MG tablet; Take 1 tablet by mouth every 6 (six) hours as needed for up to 5 days for severe pain. -     HYDROcodone-acetaminophen (NORCO) 5-325 MG tablet; Take 1 tablet by mouth every 6 (six) hours as needed for up to 5 days for severe pain.  The patient was advised to call immediately if he has any concerning symptoms in the interval. The patient voices  understanding of current treatment options and is in agreement with the current care plan.The patient knows to call the clinic with any problems, questions or concerns or go to the ER if any further progression of symptoms.   Continue diet and meds as discussed. Further disposition pending results of labs. Discussed med's effects and SE's.    HPI 63 y.o. male  presents for 3 month follow up HTN, Chol, DM with CKD, and vitamin D def.  Patient states last night had fever, chills, urinary frequency,    His blood pressure has been controlled at home, today his BP is  .  He does not workout. He denies chest pain, shortness of breath, dizziness.  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 Angelena Form.  He is on cholesterol medication, fenofibrate/lipitor and denies myalgias. His cholesterol is at goal. The cholesterol was:  01/19/2019: Cholesterol 127; HDL 31; LDL Cholesterol (Calc) 74; Triglycerides 138  He has been working on diet and exercise for diabetes with diabetic chronic kidney disease, he is on bASA, he is not on ACE/ARB due to hypotension, MF 2000mg  and cinnamon 1000mg  daliy, this AM was 120-130, no hypoglycemia, and denies  paresthesia of the feet, polydipsia, polyuria and visual disturbances. Last A1C was:  Lab Results  Component Value Date   HGBA1C 8.5 (H) 01/19/2019    Lab Results  Component Value Date   GFRNONAA 60 01/19/2019   Patient is on Vitamin D supplement. 01/19/2019: Vit D, 25-Hydroxy 15 He sees Dr. Trudie Reed for gout and is on allopurinol no  flares.  Lab Results  Component Value Date   LABURIC 6.3 01/19/2019   BMI is There is no height or weight on file to calculate BMI., he is working on diet and exercise. Wt Readings from Last 3 Encounters:  01/19/19 226 lb (102.5 kg)  11/23/18 224 lb (101.6 kg)  06/08/18 220 lb 9.6 oz (100.1 kg)    Current Medications:  Current Outpatient Medications on File Prior to Visit  Medication Sig Dispense Refill  .  allopurinol (ZYLOPRIM) 300 MG tablet TAKE 1 TABLET DAILY TO PREVENT GOUT 90 tablet 4  . aspirin EC 81 MG tablet Take 81 mg by mouth at bedtime.    Marland Kitchen. atenolol (TENORMIN) 50 MG tablet TAKE 1 TABLET DAILY TO PREVENT HEART ARRHYTHMIA 90 tablet 3  . B Complex-C (SUPER B COMPLEX PO) Take 1 tablet by mouth daily.    . fenofibrate micronized (LOFIBRA) 134 MG capsule TAKE 1 CAPSULE DAILY TO PREVENT GOUT 90 capsule 3  . metFORMIN (GLUCOPHAGE-XR) 500 MG 24 hr tablet TAKE 2 TABLETS TWICE A DAY 360 tablet 4  . montelukast (SINGULAIR) 10 MG tablet TAKE 1 TABLET DAILY FOR ALLERGIES 90 tablet 3  . Omega-3 Fatty Acids (FISH OIL) 1000 MG CAPS Take 1,000 mg by mouth 2 (two) times daily.     Marland Kitchen. OVER THE COUNTER MEDICATION Takes OTC Advil PRN for headache    . triamcinolone ointment (KENALOG) 0.1 % Apply 1 application topically 2 (two) times daily. 80 g 1  . Vitamin D, Cholecalciferol, 1000 UNITS TABS Take 1,000 mg by mouth 2 (two) times daily.      No current facility-administered medications on file prior to visit.    Medical History:  Past Medical History:  Diagnosis Date  . Calculus of gallbladder with acute cholecystitis 11/08/2014   Via CT AB 2015   . Diabetes mellitus without complication (HCC)   . Fatty liver disease, nonalcoholic 11/08/2014  . Gout 07/17/2014  . H/O cluster headache   . History of nephrolithiasis 11/08/2014  . 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 Vitamin D deficiency    Allergies:  Allergies  Allergen Reactions  . Levaquin [Levofloxacin]     GI Upset  . Valtrex [Valacyclovir Hcl] Hives     Review of Systems:  Review of Systems  Constitutional: Negative.   HENT: Negative.   Eyes: Negative.   Respiratory: Negative.   Cardiovascular: Negative.   Gastrointestinal: Negative.   Genitourinary: Negative.   Musculoskeletal: Negative.   Skin: Negative.   Neurological: Negative.   Endo/Heme/Allergies: Negative.   Psychiatric/Behavioral: Negative.     Family history- Review and  unchanged Social history- Review and unchanged Physical Exam: There were no vitals taken for this visit. Wt Readings from Last 3 Encounters:  01/19/19 226 lb (102.5 kg)  11/23/18 224 lb (101.6 kg)  06/08/18 220 lb 9.6 oz (100.1 kg)   General Appearance: Well nourished, in no apparent distress. Respiratory: Respiratory effort normal, no retractions.  Cardio: no edema  Musculoskeletal: Full ROM  Neuro: No defecits  Psych: Awake and oriented X 3, normal affect, Insight and Judgment appropriate.    Quentin MullingAmanda Beckie Viscardi, PA-C 1:52 PM [facility]

## 2019-04-26 ENCOUNTER — Ambulatory Visit: Payer: BLUE CROSS/BLUE SHIELD | Admitting: Physician Assistant

## 2019-07-04 ENCOUNTER — Encounter: Payer: Self-pay | Admitting: Internal Medicine

## 2019-07-31 ENCOUNTER — Other Ambulatory Visit: Payer: Self-pay | Admitting: Internal Medicine

## 2019-07-31 DIAGNOSIS — E1122 Type 2 diabetes mellitus with diabetic chronic kidney disease: Secondary | ICD-10-CM

## 2019-07-31 DIAGNOSIS — E1121 Type 2 diabetes mellitus with diabetic nephropathy: Secondary | ICD-10-CM

## 2019-07-31 DIAGNOSIS — N182 Chronic kidney disease, stage 2 (mild): Secondary | ICD-10-CM

## 2019-08-02 ENCOUNTER — Encounter: Payer: Self-pay | Admitting: Internal Medicine

## 2019-10-01 ENCOUNTER — Encounter: Payer: Self-pay | Admitting: Internal Medicine

## 2019-10-01 NOTE — Patient Instructions (Signed)

## 2019-10-01 NOTE — Progress Notes (Signed)
Annual  Screening/Preventative Visit  & Comprehensive Evaluation & Examination     This very nice 64 y.o. MWM presents for a Screening /Preventative Visit & comprehensive evaluation and management of multiple medical co-morbidities.  Patient has been followed for HTN, HLD, T2_NIDDM  and Vitamin D Deficiency. His Gout is controlled with his meds.      HTN predates since 2010. Patient's BP has been controlled at home.  Today's BP is  132/88.  Patient hjas hx/o pSVT & is also followed by Dr Sanjuana Kava. Patient denies any cardiac symptoms as chest pain, palpitations, shortness of breath, dizziness or ankle swelling.     Patient's hyperlipidemia is controlled with diet and medications. Patient denies myalgias or other medication SE's. Last lipids were at goal:  Lab Results  Component Value Date   CHOL 127 01/19/2019   HDL 31 (L) 01/19/2019   LDLCALC 74 01/19/2019   TRIG 138 01/19/2019   CHOLHDL 4.1 01/19/2019       Patient has hx/o T2_NIDDM since 2013 and patient denies reactive hypoglycemic symptoms, visual blurring, diabetic polys or paresthesias. He reports FBG's usually less than 140 mg%.  Last A1c was not at goal:  Lab Results  Component Value Date   HGBA1C 8.5 (H) 01/19/2019        Finally, patient has history of Vitamin D Deficiency ("34" / 2012) and last vitamin D was still low (goal is betw 70-100):  Lab Results  Component Value Date   VD25OH 47 01/19/2019    Current Outpatient Medications on File Prior to Visit  Medication Sig  . allopurinol (ZYLOPRIM) 300 MG tablet TAKE 1 TABLET DAILY TO PREVENT GOUT  . aspirin EC 81 MG tablet Take 81 mg by mouth at bedtime.  Marland Kitchen atenolol (TENORMIN) 50 MG tablet TAKE 1 TABLET DAILY TO PREVENT HEART ARRHYTHMIA  . B Complex-C (SUPER B COMPLEX PO) Take 1 tablet by mouth daily.  . fenofibrate micronized (LOFIBRA) 134 MG capsule TAKE 1 CAPSULE DAILY TO PREVENT GOUT  . metFORMIN (GLUCOPHAGE-XR) 500 MG 24 hr tablet Take 2 tablets 2 x /day with  Meals for Diabetes  . montelukast (SINGULAIR) 10 MG tablet TAKE 1 TABLET DAILY FOR ALLERGIES  . Omega-3 Fatty Acids (FISH OIL) 1000 MG CAPS Take 1,000 mg by mouth 2 (two) times daily.   Marland Kitchen OVER THE COUNTER MEDICATION Takes OTC Advil PRN for headache  . Vitamin D, Cholecalciferol, 1000 UNITS TABS Take 1,000 mg by mouth 2 (two) times daily.    No current facility-administered medications on file prior to visit.   Allergies  Allergen Reactions  . Levaquin [Levofloxacin]     GI Upset  . Valtrex [Valacyclovir Hcl] Hives   Past Medical History:  Diagnosis Date  . Calculus of gallbladder with acute cholecystitis 11/08/2014   Via CT AB 2015   . Diabetes mellitus without complication (HCC)   . Fatty liver disease, nonalcoholic 11/08/2014  . Gout 07/17/2014  . H/O cluster headache   . History of nephrolithiasis 11/08/2014  . 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 Vitamin D deficiency    Health Maintenance  Topic Date Due  . OPHTHALMOLOGY EXAM  [birth year]/1964  . COLONOSCOPY  04/26/2004  . INFLUENZA VACCINE  04/01/2019  . PNA vac Low Risk Adult (1 of 2 - PCV13) 04/27/2019  . URINE MICROALBUMIN  06/09/2019  . HEMOGLOBIN A1C  07/22/2019  . FOOT EXAM  09/30/2020  . TETANUS/TDAP  09/29/2025  . Hepatitis C Screening  Completed  . HIV Screening  Completed   Immunization History  Administered Date(s) Administered  . Influenza Inj Mdck Quad With Preservative 05/20/2017, 06/08/2018  . Influenza Split 05/15/2013, 07/17/2014, 06/18/2015  . Influenza, Seasonal, Injecte, Preservative Fre 09/08/2016  . PPD Test 07/17/2014, 09/30/2015, 02/02/2017, 06/08/2018  . Pneumococcal Polysaccharide-23 02/07/2016  . Tdap 09/30/2015   Last Colon - ~ 2005 per Dr Earlean Shawl and overdue (patient aware and will confirm coverage with his insurance carrier).  Past Surgical History:  Procedure Laterality Date  . NASAL SINUS SURGERY    . TOE SURGERY    . VARICOCELE EXCISION     Family History  Problem Relation Age of  Onset  . Hypertension Father   . Diabetes Father   . Heart attack Father 39   Social History   Socioeconomic History  . Marital status: Married    Spouse name: Not on file  . Number of children: 1son =- Aaron Edelman  Occupational History  . Occupation: [employer] Services operational officer  Tobacco Use  . Smoking status: Never Smoker  . Smokeless tobacco: Never Used  Substance and Sexual Activity  . Alcohol use: No  . Drug use: No  . Sexual activity: Not on file    ROS Constitutional: Denies fever, chills, weight loss/gain, headaches, insomnia,  night sweats or change in appetite. Does c/o fatigue. Eyes: Denies redness, blurred vision, diplopia, discharge, itchy or watery eyes.  ENT: Denies discharge, congestion, post nasal drip, epistaxis, sore throat, earache, hearing loss, dental pain, Tinnitus, Vertigo, Sinus pain or snoring.  Cardio: Denies chest pain, palpitations, irregular heartbeat, syncope, dyspnea, diaphoresis, orthopnea, PND, claudication or edema Respiratory: denies cough, dyspnea, DOE, pleurisy, hoarseness, laryngitis or wheezing.  Gastrointestinal: Denies dysphagia, heartburn, reflux, water brash, pain, cramps, nausea, vomiting, bloating, diarrhea, constipation, hematemesis, melena, hematochezia, jaundice or hemorrhoids Genitourinary: Denies dysuria, frequency, urgency, nocturia, hesitancy, discharge, hematuria or flank pain Musculoskeletal: Denies arthralgia, myalgia, stiffness, Jt. Swelling, pain, limp or strain/sprain. Denies Falls. Skin: Denies puritis, rash, hives, warts, acne, eczema or change in skin lesion Neuro: No weakness, tremor, incoordination, spasms, paresthesia or pain Psychiatric: Denies confusion, memory loss or sensory loss. Denies Depression. Endocrine: Denies change in weight, skin, hair change, nocturia, and paresthesia, diabetic polys, visual blurring or hyper / hypo glycemic episodes.  Heme/Lymph: No excessive bleeding, bruising or enlarged lymph nodes.  Physical  Exam  BP 132/88   Pulse 68   Temp (!) 97.5 F (36.4 C)   Resp 16   Ht 6' 3.5" (1.918 m)   Wt 220 lb 6.4 oz (100 kg)   BMI 27.18 kg/m   General Appearance: Well nourished and well groomed and in no apparent distress.  Eyes: PERRLA, EOMs, conjunctiva no swelling or erythema, normal fundi and vessels. Sinuses: No frontal/maxillary tenderness ENT/Mouth: EACs patent / TMs  nl. Nares clear without erythema, swelling, mucoid exudates. Oral hygiene is good. No erythema, swelling, or exudate. Tongue normal, non-obstructing. Tonsils not swollen or erythematous. Hearing normal.  Neck: Supple, thyroid not palpable. No bruits, nodes or JVD. Respiratory: Respiratory effort normal.  BS equal and clear bilateral without rales, rhonci, wheezing or stridor. Cardio: Heart sounds are normal with regular rate and rhythm and no murmurs, rubs or gallops. Peripheral pulses are normal and equal bilaterally without edema. No aortic or femoral bruits. Chest: symmetric with normal excursions and percussion.  Abdomen: Soft, with Nl bowel sounds. Nontender, no guarding, rebound, hernias, masses, or organomegaly.  Lymphatics: Non tender without lymphadenopathy.  Musculoskeletal: Full ROM all peripheral extremities, joint stability, 5/5 strength, and normal gait. Skin: Warm  and dry without rashes, lesions, cyanosis, clubbing or  ecchymosis.  Neuro: Cranial nerves intact, reflexes equal bilaterally. Normal muscle tone, no cerebellar symptoms. Sensation intact.  Pysch: Alert and oriented X 3 with normal affect, insight and judgment appropriate.   Assessment and Plan  1. Annual Preventative/Screening Exam    2. Essential hypertension  - EKG 12-Lead - [place], RETROPERITNL ABD,  LTD - Urinalysis, Routine w reflex microscopic - Microalbumin / creatinine urine ratio - CBC with Differential/Platelet - COMPLETE METABOLIC PANEL WITH GFR - Magnesium - TSH  3. Hyperlipidemia associated with type 2 diabetes mellitus  (HCC)  - EKG 12-Lead - [place], RETROPERITNL ABD,  LTD - TSH - Hemoglobin A1c - Lipid panel  4. Type 2 diabetes mellitus with stage 2 chronic kidney disease, without long-term current use of insulin (HCC)  - EKG 12-Lead - [place], RETROPERITNL ABD,  LTD - HM DIABETES FOOT EXAM - LOW EXTREMITY NEUR EXAM DOCUM - Hemoglobin A1c - Insulin, random  5. Vitamin D deficiency  - VITAMIN D 25 Hydroxy   6. Idiopathic gout, unspecified chronicity, unspecified site  - Uric acid  7. BPH with obstruction/lower urinary tract symptoms  - PSA  8. Screening for colorectal cancer  - POC Hemoccult Bld/Stl  9. Prostate cancer screening  - PSA  10. Screening for ischemic heart disease  - EKG 12-Lead  11. FHx: heart disease  - EKG 12-Lead - [place], RETROPERITNL ABD,  LTD  12. Screening for AAA (aortic abdominal aneurysm)  - [place], RETROPERITNL ABD,  LTD  13. Medication management  - Uric acid - CBC with Differential/Platelet - COMPLETE METABOLIC PANEL WITH GFR - Magnesium - TSH - Hemoglobin A1c - Insulin, random - VITAMIN D 25 Hydroxy  - Lipid panel        Patient was counseled in prudent diet, weight control to achieve/maintain BMI less than 25, BP monitoring, regular exercise and medications as discussed.  Discussed med effects and SE's. Routine screening labs and tests as requested with regular follow-up as recommended. Over 40 minutes of exam, counseling, chart review and high complex critical decision making was performed   Sean Maw, MD

## 2019-10-02 ENCOUNTER — Other Ambulatory Visit: Payer: Self-pay

## 2019-10-02 ENCOUNTER — Ambulatory Visit (INDEPENDENT_AMBULATORY_CARE_PROVIDER_SITE_OTHER): Payer: BC Managed Care – PPO | Admitting: Internal Medicine

## 2019-10-02 VITALS — BP 132/88 | HR 68 | Temp 97.5°F | Resp 16 | Ht 75.5 in | Wt 220.4 lb

## 2019-10-02 DIAGNOSIS — Z23 Encounter for immunization: Secondary | ICD-10-CM | POA: Diagnosis not present

## 2019-10-02 DIAGNOSIS — I10 Essential (primary) hypertension: Secondary | ICD-10-CM | POA: Diagnosis not present

## 2019-10-02 DIAGNOSIS — N410 Acute prostatitis: Secondary | ICD-10-CM

## 2019-10-02 DIAGNOSIS — Z0000 Encounter for general adult medical examination without abnormal findings: Secondary | ICD-10-CM

## 2019-10-02 DIAGNOSIS — M1000 Idiopathic gout, unspecified site: Secondary | ICD-10-CM

## 2019-10-02 DIAGNOSIS — E559 Vitamin D deficiency, unspecified: Secondary | ICD-10-CM

## 2019-10-02 DIAGNOSIS — Z125 Encounter for screening for malignant neoplasm of prostate: Secondary | ICD-10-CM

## 2019-10-02 DIAGNOSIS — E1169 Type 2 diabetes mellitus with other specified complication: Secondary | ICD-10-CM | POA: Diagnosis not present

## 2019-10-02 DIAGNOSIS — N182 Chronic kidney disease, stage 2 (mild): Secondary | ICD-10-CM

## 2019-10-02 DIAGNOSIS — Z8249 Family history of ischemic heart disease and other diseases of the circulatory system: Secondary | ICD-10-CM | POA: Diagnosis not present

## 2019-10-02 DIAGNOSIS — Z1212 Encounter for screening for malignant neoplasm of rectum: Secondary | ICD-10-CM

## 2019-10-02 DIAGNOSIS — Z1211 Encounter for screening for malignant neoplasm of colon: Secondary | ICD-10-CM

## 2019-10-02 DIAGNOSIS — E785 Hyperlipidemia, unspecified: Secondary | ICD-10-CM | POA: Diagnosis not present

## 2019-10-02 DIAGNOSIS — Z0001 Encounter for general adult medical examination with abnormal findings: Secondary | ICD-10-CM

## 2019-10-02 DIAGNOSIS — Z136 Encounter for screening for cardiovascular disorders: Secondary | ICD-10-CM | POA: Diagnosis not present

## 2019-10-02 DIAGNOSIS — Z79899 Other long term (current) drug therapy: Secondary | ICD-10-CM

## 2019-10-02 DIAGNOSIS — N138 Other obstructive and reflux uropathy: Secondary | ICD-10-CM

## 2019-10-02 DIAGNOSIS — E1122 Type 2 diabetes mellitus with diabetic chronic kidney disease: Secondary | ICD-10-CM

## 2019-10-03 ENCOUNTER — Other Ambulatory Visit: Payer: Self-pay | Admitting: Internal Medicine

## 2019-10-03 DIAGNOSIS — R390 Extravasation of urine: Secondary | ICD-10-CM | POA: Diagnosis not present

## 2019-10-03 DIAGNOSIS — N410 Acute prostatitis: Secondary | ICD-10-CM | POA: Diagnosis not present

## 2019-10-03 DIAGNOSIS — N390 Urinary tract infection, site not specified: Secondary | ICD-10-CM | POA: Diagnosis not present

## 2019-10-03 LAB — COMPLETE METABOLIC PANEL WITH GFR
AG Ratio: 1.8 (calc) (ref 1.0–2.5)
ALT: 18 U/L (ref 9–46)
AST: 14 U/L (ref 10–35)
Albumin: 4.9 g/dL (ref 3.6–5.1)
Alkaline phosphatase (APISO): 64 U/L (ref 35–144)
BUN/Creatinine Ratio: 20 (calc) (ref 6–22)
BUN: 26 mg/dL — ABNORMAL HIGH (ref 7–25)
CO2: 24 mmol/L (ref 20–32)
Calcium: 10.3 mg/dL (ref 8.6–10.3)
Chloride: 104 mmol/L (ref 98–110)
Creat: 1.28 mg/dL — ABNORMAL HIGH (ref 0.70–1.25)
GFR, Est African American: 68 mL/min/{1.73_m2} (ref >=60)
GFR, Est Non African American: 58 mL/min/{1.73_m2} — ABNORMAL LOW (ref >=60)
Globulin: 2.8 g/dL (calc) (ref 1.9–3.7)
Glucose, Bld: 212 mg/dL — ABNORMAL HIGH (ref 65–99)
Potassium: 5 mmol/L (ref 3.5–5.3)
Sodium: 136 mmol/L (ref 135–146)
Total Bilirubin: 0.4 mg/dL (ref 0.2–1.2)
Total Protein: 7.7 g/dL (ref 6.1–8.1)

## 2019-10-03 LAB — URINALYSIS, ROUTINE W REFLEX MICROSCOPIC
Bilirubin Urine: NEGATIVE
Glucose, UA: NEGATIVE
Hgb urine dipstick: NEGATIVE
Hyaline Cast: NONE SEEN /LPF
Nitrite: POSITIVE — AB
Specific Gravity, Urine: 1.023 (ref 1.001–1.03)
pH: <=5 (ref 5.0–8.0)

## 2019-10-03 LAB — CBC WITH DIFFERENTIAL/PLATELET
Absolute Monocytes: 467 cells/uL (ref 200–950)
Basophils Absolute: 22 cells/uL (ref 0–200)
Basophils Relative: 0.3 %
Eosinophils Absolute: 190 cells/uL (ref 15–500)
Eosinophils Relative: 2.6 %
HCT: 43.4 % (ref 38.5–50.0)
Hemoglobin: 14.2 g/dL (ref 13.2–17.1)
Lymphs Abs: 2088 cells/uL (ref 850–3900)
MCH: 28.3 pg (ref 27.0–33.0)
MCHC: 32.7 g/dL (ref 32.0–36.0)
MCV: 86.6 fL (ref 80.0–100.0)
MPV: 11.5 fL (ref 7.5–12.5)
Monocytes Relative: 6.4 %
Neutro Abs: 4533 cells/uL (ref 1500–7800)
Neutrophils Relative %: 62.1 %
Platelets: 279 10*3/uL (ref 140–400)
RBC: 5.01 10*6/uL (ref 4.20–5.80)
RDW: 13.4 % (ref 11.0–15.0)
Total Lymphocyte: 28.6 %
WBC: 7.3 10*3/uL (ref 3.8–10.8)

## 2019-10-03 LAB — PSA: PSA: 0.7 ng/mL (ref <=4.0)

## 2019-10-03 LAB — MICROALBUMIN / CREATININE URINE RATIO
Creatinine, Urine: 290 mg/dL (ref 20–320)
Microalb Creat Ratio: 18 mcg/mg creat (ref <30)
Microalb, Ur: 5.3 mg/dL

## 2019-10-03 LAB — TSH: TSH: 2.4 mIU/L (ref 0.40–4.50)

## 2019-10-03 LAB — HEMOGLOBIN A1C
Hgb A1c MFr Bld: 8.5 % of total Hgb — ABNORMAL HIGH (ref <5.7)
Mean Plasma Glucose: 197 (calc)
eAG (mmol/L): 10.9 (calc)

## 2019-10-03 LAB — LIPID PANEL
Cholesterol: 127 mg/dL (ref <200)
HDL: 31 mg/dL — ABNORMAL LOW (ref >=40)
LDL Cholesterol (Calc): 71 mg/dL (calc)
Non-HDL Cholesterol (Calc): 96 mg/dL (calc) (ref <130)
Total CHOL/HDL Ratio: 4.1 (calc) (ref <5.0)
Triglycerides: 171 mg/dL — ABNORMAL HIGH (ref <150)

## 2019-10-03 LAB — MAGNESIUM: Magnesium: 1.9 mg/dL (ref 1.5–2.5)

## 2019-10-03 LAB — VITAMIN D 25 HYDROXY (VIT D DEFICIENCY, FRACTURES): Vit D, 25-Hydroxy: 45 ng/mL (ref 30–100)

## 2019-10-03 LAB — URIC ACID: Uric Acid, Serum: 5.2 mg/dL (ref 4.0–8.0)

## 2019-10-03 LAB — INSULIN, RANDOM: Insulin: 16.4 u[IU]/mL

## 2019-10-03 NOTE — Addendum Note (Signed)
Addended by: Emerson Monte on: 10/03/2019 02:23 PM   Modules accepted: Orders

## 2019-10-05 ENCOUNTER — Other Ambulatory Visit: Payer: Self-pay | Admitting: Internal Medicine

## 2019-10-05 DIAGNOSIS — N410 Acute prostatitis: Secondary | ICD-10-CM

## 2019-10-05 LAB — URINE CULTURE
MICRO NUMBER:: 10107924
SPECIMEN QUALITY:: ADEQUATE

## 2019-10-05 MED ORDER — SULFAMETHOXAZOLE-TRIMETHOPRIM 800-160 MG PO TABS: ORAL_TABLET | ORAL | 0 refills | Status: DC

## 2019-11-16 ENCOUNTER — Other Ambulatory Visit: Payer: Self-pay | Admitting: Internal Medicine

## 2019-11-16 DIAGNOSIS — I472 Ventricular tachycardia, unspecified: Secondary | ICD-10-CM

## 2019-12-20 ENCOUNTER — Encounter: Payer: Self-pay | Admitting: General Practice

## 2020-01-02 DIAGNOSIS — I700 Atherosclerosis of aorta: Secondary | ICD-10-CM | POA: Insufficient documentation

## 2020-01-02 NOTE — Progress Notes (Deleted)
FOLLOW UP  Assessment and Plan:   Hypertension Well controlled with current medications  Monitor blood pressure at home; patient to call if consistently greater than 130/80 Continue DASH diet.   Reminder to go to the ER if any CP, SOB, nausea, dizziness, severe HA, changes vision/speech, left arm numbness and tingling and jaw pain.  Hyperlipidemia associated with T2DM (HCC) Currently very nearly at goal of LDL <70; diet discussed  Continue low cholesterol diet and exercise.  Check lipid panel.   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  Perform daily foot/skin check, notify office of any concerning changes.  Check A1C  CKD II/III associated with T2DM (HCC) Increase fluids, avoid NSAIDS, monitor sugars, will monitor  Overweight with co morbidities Long discussion about weight loss, diet, and exercise Recommended diet heavy in fruits and veggies and low in animal meats, cheeses, and dairy products, appropriate calorie intake Discussed ideal weight for height  Will follow up in 3 months   Vitamin D Def Near goal at last visit; *** continue supplementation to maintain goal of 60-100 Defer Vit D level  Gout Continue allopurinol Diet discussed Check uric acid as needed   Continue diet and meds as discussed. Further disposition pending results of labs. Discussed med's effects and SE's.   Over 30 minutes of exam, counseling, chart review, and critical decision making was performed.   Future Appointments  Date Time Provider Department Center  01/03/2020 11:00 AM Judd Gaudier, NP [facility] None  04/04/2020 10:30 AM Lucky Cowboy, MD [facility] None  10/11/2020 11:00 AM Lucky Cowboy, MD [facility] None    ----------------------------------------------------------------------------------------------------------------------  HPI 64 y.o. male  presents for 3 month follow up on hypertension,  cholesterol, diabetes with CKD, weight, gout and vitamin D deficiency.   BMI is There is no height or weight on file to calculate BMI., he has been working on diet and exercise. Wt Readings from Last 3 Encounters:  10/02/19 220 lb 6.4 oz (100 kg)  01/19/19 226 lb (102.5 kg)  11/23/18 224 lb (101.6 kg)   He has abdominal aortic atherosclerosis per CT 2018.  His blood pressure has been controlled at home, today their BP is    He does workout. He denies chest pain, shortness of breath, dizziness.   He is on cholesterol medication (fenofibrate daily) and denies myalgias. His cholesterol is near LDL goal of <70. The cholesterol last visit was:   Lab Results  Component Value Date   CHOL 127 10/02/2019   HDL 31 (L) 10/02/2019   LDLCALC 71 10/02/2019   TRIG 171 (H) 10/02/2019   CHOLHDL 4.1 10/02/2019    He has been working on diet and exercise for T2 diabetes (on metformin ***), and denies foot ulcerations, hypoglycemia , increased appetite, nausea, paresthesia of the feet, polydipsia, polyuria, visual disturbances, vomiting and weight loss. He does check a fasting value daily and running 130s. Last A1C in the office was:  Lab Results  Component Value Date   HGBA1C 8.5 (H) 10/02/2019    He has CKD II/III associated with T2DM monitored at this office:  Lab Results  Component Value Date   GFRNONAA 58 (L) 10/02/2019   Patient is on Vitamin D supplement and approaching goal at most recent check:    Lab Results  Component Value Date   VD25OH 45 10/02/2019     Patient is on allopurinol for gout and does not report a recent flare.  Lab Results  Component Value Date  LABURIC 5.2 10/02/2019      Current Medications:  Current Outpatient Medications on File Prior to Visit  Medication Sig  . allopurinol (ZYLOPRIM) 300 MG tablet TAKE 1 TABLET DAILY TO PREVENT GOUT  . aspirin EC 81 MG tablet Take 81 mg by mouth at bedtime.  Marland Kitchen atenolol (TENORMIN) 50 MG tablet Take 1 tablet  Daily for Heart  Rhythm  . B Complex-C (SUPER B COMPLEX PO) Take 1 tablet by mouth daily.  . fenofibrate micronized (LOFIBRA) 134 MG capsule TAKE 1 CAPSULE DAILY TO PREVENT GOUT  . metFORMIN (GLUCOPHAGE-XR) 500 MG 24 hr tablet Take 2 tablets 2 x /day with Meals for Diabetes  . montelukast (SINGULAIR) 10 MG tablet TAKE 1 TABLET DAILY FOR ALLERGIES  . Omega-3 Fatty Acids (FISH OIL) 1000 MG CAPS Take 1,000 mg by mouth 2 (two) times daily.   Marland Kitchen OVER THE COUNTER MEDICATION Takes OTC Advil PRN for headache  . sulfamethoxazole-trimethoprim (BACTRIM DS) 800-160 MG tablet Take 1 tablet 2 x /day with Meals for Infection  . Vitamin D, Cholecalciferol, 1000 UNITS TABS Take 1,000 mg by mouth 2 (two) times daily.    No current facility-administered medications on file prior to visit.     Allergies:  Allergies  Allergen Reactions  . Levaquin [Levofloxacin]     GI Upset  . Valtrex [Valacyclovir Hcl] Hives     Medical History:  Past Medical History:  Diagnosis Date  . Calculus of gallbladder with acute cholecystitis 11/08/2014   Via CT AB 2015   . Diabetes mellitus without complication ([place])   . Fatty liver disease, nonalcoholic 11/13/1759  . Gout 07/17/2014  . H/O cluster headache   . History of nephrolithiasis 11/08/2014  . 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 Vitamin D deficiency    Family history- Reviewed and unchanged Social history- Reviewed and unchanged   Review of Systems:  Review of Systems  Constitutional: Negative for malaise/fatigue and weight loss.  HENT: Negative for hearing loss and tinnitus.   Eyes: Negative for blurred vision and double vision.  Respiratory: Negative for cough, shortness of breath and wheezing.   Cardiovascular: Negative for chest pain, palpitations, orthopnea, claudication and leg swelling.  Gastrointestinal: Negative for abdominal pain, blood in stool, constipation, diarrhea, heartburn, melena, nausea and vomiting.  Genitourinary: Negative.   Musculoskeletal: Negative  for joint pain and myalgias.  Skin: Negative for rash.  Neurological: Negative for dizziness, tingling, sensory change, weakness and headaches.  Endo/Heme/Allergies: Negative for polydipsia.  Psychiatric/Behavioral: Negative.   All other systems reviewed and are negative.   Physical Exam: There were no vitals taken for this visit. Wt Readings from Last 3 Encounters:  10/02/19 220 lb 6.4 oz (100 kg)  01/19/19 226 lb (102.5 kg)  11/23/18 224 lb (101.6 kg)   General Appearance: Well nourished, in no apparent distress. Eyes: PERRLA, EOMs, conjunctiva no swelling or erythema Sinuses: No Frontal/maxillary tenderness ENT/Mouth: Ext aud canals clear, TMs without erythema, bulging. No erythema, swelling, or exudate on post pharynx.  Tonsils not swollen or erythematous. Hearing normal.  Neck: Supple, thyroid normal.  Respiratory: Respiratory effort normal, BS equal bilaterally without rales, rhonchi, wheezing or stridor.  Cardio: RRR with no MRGs. Brisk peripheral pulses without edema.  Abdomen: Soft, + BS.  Non tender, no guarding, rebound, hernias, masses. Lymphatics: Non tender without lymphadenopathy.  Musculoskeletal: Full ROM, 5/5 strength, Normal gait Skin: Warm, dry; without rash, concerning lesions, ecchymosis Neuro: Cranial nerves intact. No cerebellar symptoms.  Psych: Awake and oriented X  3, normal affect, Insight and Judgment appropriate.    Dan Maker, NP 8:09 AM [facility]

## 2020-01-03 ENCOUNTER — Ambulatory Visit: Payer: BC Managed Care – PPO | Admitting: Adult Health

## 2020-01-27 ENCOUNTER — Other Ambulatory Visit: Payer: Self-pay | Admitting: Internal Medicine

## 2020-01-27 DIAGNOSIS — M1000 Idiopathic gout, unspecified site: Secondary | ICD-10-CM

## 2020-03-23 ENCOUNTER — Other Ambulatory Visit: Payer: Self-pay | Admitting: Internal Medicine

## 2020-03-30 ENCOUNTER — Other Ambulatory Visit: Payer: Self-pay | Admitting: Internal Medicine

## 2020-03-30 DIAGNOSIS — E782 Mixed hyperlipidemia: Secondary | ICD-10-CM

## 2020-04-04 ENCOUNTER — Encounter: Payer: Self-pay | Admitting: Internal Medicine

## 2020-04-04 ENCOUNTER — Ambulatory Visit (INDEPENDENT_AMBULATORY_CARE_PROVIDER_SITE_OTHER): Payer: BC Managed Care – PPO | Admitting: Internal Medicine

## 2020-04-04 DIAGNOSIS — M1000 Idiopathic gout, unspecified site: Secondary | ICD-10-CM

## 2020-04-04 DIAGNOSIS — E559 Vitamin D deficiency, unspecified: Secondary | ICD-10-CM

## 2020-04-04 DIAGNOSIS — N182 Chronic kidney disease, stage 2 (mild): Secondary | ICD-10-CM

## 2020-04-04 DIAGNOSIS — E785 Hyperlipidemia, unspecified: Secondary | ICD-10-CM

## 2020-04-04 DIAGNOSIS — I10 Essential (primary) hypertension: Secondary | ICD-10-CM

## 2020-04-04 DIAGNOSIS — Z5329 Procedure and treatment not carried out because of patient's decision for other reasons: Secondary | ICD-10-CM

## 2020-04-04 DIAGNOSIS — E1169 Type 2 diabetes mellitus with other specified complication: Secondary | ICD-10-CM

## 2020-04-04 DIAGNOSIS — E1122 Type 2 diabetes mellitus with diabetic chronic kidney disease: Secondary | ICD-10-CM

## 2020-04-04 NOTE — Progress Notes (Signed)
   N  O S  H  O  W                                                                                                                                                       This very nice 64 y.o.  MWMpresents for 6 month follow up with HTN, HLD,  T2_NIDDM  and Vitamin D Deficiency. Patient has hx/o Gout controlled on his meds.       Patient is treated for HTN  (2010) & BP has been controlled at home. Today's  .  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 Patient has had no complaints of any cardiac type chest pain, palpitations, dyspnea / orthopnea / PND, dizziness, claudication, or dependent edema.      Hyperlipidemia is controlled with diet & meds. Patient denies myalgias or other med SE's. Last Lipids were at goal with slightly elevated Trig's:  Lab Results  Component Value Date   CHOL 127 10/02/2019   HDL 31 (L) 10/02/2019   LDLCALC 71 10/02/2019   TRIG 171 (H) 10/02/2019   CHOLHDL 4.1 10/02/2019    Also, the patient has history of  poorly controlled T2_NIDDM (2013)  and has had no symptoms of reactive hypoglycemia, diabetic polys, paresthesias or visual blurring.  Last A1c was not at goal:  Lab Results  Component Value Date   HGBA1C 8.5 (H) 10/02/2019       Further, the patient also has history of Vitamin D Deficiency ("34" / 2012) and supplements vitamin D without any suspected side-effects. Last vitamin D was still low:  Lab Results  Component Value Date   VD25OH 45 10/02/2019

## 2020-07-16 ENCOUNTER — Other Ambulatory Visit: Payer: Self-pay

## 2020-07-16 ENCOUNTER — Encounter: Payer: Self-pay | Admitting: Internal Medicine

## 2020-07-16 ENCOUNTER — Ambulatory Visit (INDEPENDENT_AMBULATORY_CARE_PROVIDER_SITE_OTHER): Payer: BC Managed Care – PPO | Admitting: Internal Medicine

## 2020-07-16 VITALS — BP 122/68 | HR 74 | Temp 97.5°F | Ht 75.5 in | Wt 217.0 lb

## 2020-07-16 DIAGNOSIS — E1122 Type 2 diabetes mellitus with diabetic chronic kidney disease: Secondary | ICD-10-CM | POA: Diagnosis not present

## 2020-07-16 DIAGNOSIS — M1000 Idiopathic gout, unspecified site: Secondary | ICD-10-CM

## 2020-07-16 DIAGNOSIS — E785 Hyperlipidemia, unspecified: Secondary | ICD-10-CM | POA: Diagnosis not present

## 2020-07-16 DIAGNOSIS — N182 Chronic kidney disease, stage 2 (mild): Secondary | ICD-10-CM

## 2020-07-16 DIAGNOSIS — E1169 Type 2 diabetes mellitus with other specified complication: Secondary | ICD-10-CM | POA: Diagnosis not present

## 2020-07-16 DIAGNOSIS — E559 Vitamin D deficiency, unspecified: Secondary | ICD-10-CM | POA: Diagnosis not present

## 2020-07-16 DIAGNOSIS — Z23 Encounter for immunization: Secondary | ICD-10-CM

## 2020-07-16 DIAGNOSIS — Z79899 Other long term (current) drug therapy: Secondary | ICD-10-CM | POA: Diagnosis not present

## 2020-07-16 DIAGNOSIS — I10 Essential (primary) hypertension: Secondary | ICD-10-CM

## 2020-07-16 MED ORDER — GLUCOSE BLOOD VI STRP: ORAL_STRIP | 1 refills | Status: DC

## 2020-07-16 NOTE — Patient Instructions (Signed)

## 2020-07-16 NOTE — Progress Notes (Signed)
History of Present Illness:       This very nice 64 y.o.  MWM presents for belated 6 month follow up with HTN, HLD, Pre-Diabetes and Vitamin D Deficiency.       Patient is treated for HTN circa 2010  & BP has been controlled at home. Today's BP -  122/68  is at goal..  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  Patient has had no complaints of any cardiac type chest pain, palpitations, dyspnea / orthopnea / PND, dizziness, claudication, or dependent edema.      Hyperlipidemia is controlled with diet & meds. Patient denies myalgias or other med SE's. Last Lipids were at goal except slightly elevated Trig's:  Lab Results  Component Value Date   CHOL 127 10/02/2019   HDL 31 (L) 10/02/2019   LDLCALC 71 10/02/2019   TRIG 171 (H) 10/02/2019   CHOLHDL 4.1 10/02/2019    Also, the patient has history of T2_NIDDM (2013)  and has had no symptoms of reactive hypoglycemia, diabetic polys, paresthesias or visual blurring.  Last A1c was  not at goal:  Lab Results  Component Value Date   HGBA1C 8.5 (H) 10/02/2019       Further, the patient also has history of Vitamin D Deficiency ("34" /2012) and supplements vitamin D without any suspected side-effects. Last vitamin D was not at goal   Lab Results  Component Value Date   VD25OH 45 10/02/2019    Current Outpatient Medications on File Prior to Visit  Medication Sig  . allopurinol  300 MG  TAKE 1 TABLET DAILY TO PREVENT GOUT  . aspirin EC 81 MG  Take 81 mg by mouth at bedtime.  Marland Kitchen atenolol (50 MG  Take 1 tablet  Daily for Heart Rhythm  . SUPER B COMPLEX  Take 1 tablet by mouth daily.  . fenofibrate  134 MG  TAKE 1 CAPSULE DAILY  . metFORMIN-XR 500 MG  Take 2 tablets 2 x /day with Meals for Diabetes  . montelukast  10 MG TAKE 1 TABLET DAILY FOR ALLERGIES  . Omega-3 FISH OIL 1000 MG  Take 1,000 mg by mouth 2 (two) times daily.   . OTC Advil  Takes PRN for headache  . Vitamin D 1000 UNITS  Take 1,000 mg by mouth 2 (two) times  daily.     Allergies  Allergen Reactions  . Levaquin [Levofloxacin]     GI Upset  . Valtrex [Valacyclovir Hcl] Hives    PMHx:   Past Medical History:  Diagnosis Date  . Calculus of gallbladder with acute cholecystitis 11/08/2014   Via CT AB 2015   . Diabetes mellitus without complication (HCC)   . Fatty liver disease, nonalcoholic 11/08/2014  . Gout 07/17/2014  . H/O cluster headache   . History of nephrolithiasis 11/08/2014  . 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 Vitamin D deficiency     Immunization History  Administered Date(s) Administered  . Influenza Inj Mdck Quad With Preservative 05/20/2017, 06/08/2018  . Influenza Split 05/15/2013, 07/17/2014, 06/18/2015  . Influenza, High Dose Seasonal PF 10/02/2019  . Influenza, Seasonal, Injecte, Preservative Fre 09/08/2016  . PPD Test 07/17/2014, 09/30/2015, 02/02/2017, 06/08/2018  . Pneumococcal Conjugate-13 10/02/2019  . Pneumococcal Polysaccharide-23 02/07/2016  . Tdap 09/30/2015    Past Surgical History:  Procedure Laterality Date  . NASAL SINUS SURGERY    . TOE SURGERY    . VARICOCELE EXCISION      FHx:  Reviewed / unchanged  SHx:    Reviewed / unchanged   Systems Review:  Constitutional: Denies fever, chills, wt changes, headaches, insomnia, fatigue, night sweats, change in appetite. Eyes: Denies redness, blurred vision, diplopia, discharge, itchy, watery eyes.  ENT: Denies discharge, congestion, post nasal drip, epistaxis, sore throat, earache, hearing loss, dental pain, tinnitus, vertigo, sinus pain, snoring.  CV: Denies chest pain, palpitations, irregular heartbeat, syncope, dyspnea, diaphoresis, orthopnea, PND, claudication or edema. Respiratory: denies cough, dyspnea, DOE, pleurisy, hoarseness, laryngitis, wheezing.  Gastrointestinal: Denies dysphagia, odynophagia, heartburn, reflux, water brash, abdominal pain or cramps, nausea, vomiting, bloating, diarrhea, constipation, hematemesis, melena, hematochezia   or hemorrhoids. Genitourinary: Denies dysuria, frequency, urgency, nocturia, hesitancy, discharge, hematuria or flank pain. Musculoskeletal: Denies arthralgias, myalgias, stiffness, jt. swelling, pain, limping or strain/sprain.  Skin: Denies pruritus, rash, hives, warts, acne, eczema or change in skin lesion(s). Neuro: No weakness, tremor, incoordination, spasms, paresthesia or pain. Psychiatric: Denies confusion, memory loss or sensory loss. Endo: Denies change in weight, skin or hair change.  Heme/Lymph: No excessive bleeding, bruising or enlarged lymph nodes.  Physical Exam  BP 122/68   Pulse 74   Temp (!) 97.5 F (36.4 C)   Ht 6' 3.5" (1.918 m)   Wt 217 lb (98.4 kg)   SpO2 97%   BMI 26.77 kg/m   Appears  well nourished, well groomed  and in no distress.  Eyes: PERRLA, EOMs, conjunctiva no swelling or erythema. Sinuses: No frontal/maxillary tenderness ENT/Mouth: EAC's clear, TM's nl w/o erythema, bulging. Nares clear w/o erythema, swelling, exudates. Oropharynx clear without erythema or exudates. Oral hygiene is good. Tongue normal, non obstructing. Hearing intact.  Neck: Supple. Thyroid not palpable. Car 2+/2+ without bruits, nodes or JVD. Chest: Respirations nl with BS clear & equal w/o rales, rhonchi, wheezing or stridor.  Cor: Heart sounds normal w/ regular rate and rhythm without sig. murmurs, gallops, clicks or rubs. Peripheral pulses normal and equal  without edema.  Abdomen: Soft & bowel sounds normal. Non-tender w/o guarding, rebound, hernias, masses or organomegaly.  Lymphatics: Unremarkable.  Musculoskeletal: Full ROM all peripheral extremities, joint stability, 5/5 strength and normal gait.  Skin: Warm, dry without exposed rashes, lesions or ecchymosis apparent.  Neuro: Cranial nerves intact, reflexes equal bilaterally. Sensory-motor testing grossly intact. Tendon reflexes grossly intact.  Pysch: Alert & oriented x 3.  Insight and judgement nl & appropriate. No  ideations.  Assessment and Plan:  1. Essential hypertension  - Continue medication, monitor blood pressure at home.  - Continue DASH diet.  Reminder to go to the ER if any CP,  SOB, nausea, dizziness, severe HA, changes vision/speech.  - CBC with Differential/Platelet - COMPLETE METABOLIC PANEL WITH GFR - Magnesium - TSH  2.  - Continue diet/meds, exercise,& lifestyle modifications.  - Continue monitor periodic cholesterol/liver & renal functions  Hyperlipidemia associated with type 2 diabetes mellitus (HCC)  - Lipid panel - TSH  3. Type 2 diabetes mellitus with stage 2 chronic kidney disease, without long-term current use of insulin (HCC)  - Continue diet, exercise  - Lifestyle modifications.  - Monitor appropriate labs.  - Hemoglobin A1c - Insulin, random  4. Vitamin D deficiency  - Continue supplementation.  - VITAMIN D 25 Hydroxy (Vit-D Deficiency, Fractures)  5. Idiopathic gout, unspecified chronicity, unspecified site  - Uric acid  6. Medication management  - CBC with Differential/Platelet - COMPLETE METABOLIC PANEL WITH GFR - Magnesium - Lipid panel - TSH - Hemoglobin A1c - Insulin, random - VITAMIN D 25 Hydroxy  -  Uric acid  7. Need for immunization against influenza       Discussed  regular exercise, BP monitoring, weight control to achieve/maintain BMI less than 25 and discussed med and SE's. Recommended labs to assess and monitor clinical status with further disposition pending results of labs.  I discussed the assessment and treatment plan with the patient. The patient was provided an opportunity to ask questions and all were answered. The patient agreed with the plan and demonstrated an understanding of the instructions.  I provided over 30 minutes of exam, counseling, chart review and  complex critical decision making.   Marinus Maw, MD

## 2020-07-17 LAB — COMPLETE METABOLIC PANEL WITH GFR
AG Ratio: 1.9 (calc) (ref 1.0–2.5)
ALT: 15 U/L (ref 9–46)
AST: 12 U/L (ref 10–35)
Albumin: 4.7 g/dL (ref 3.6–5.1)
Alkaline phosphatase (APISO): 65 U/L (ref 35–144)
BUN: 21 mg/dL (ref 7–25)
CO2: 24 mmol/L (ref 20–32)
Calcium: 10.3 mg/dL (ref 8.6–10.3)
Chloride: 107 mmol/L (ref 98–110)
Creat: 1.07 mg/dL (ref 0.70–1.25)
GFR, Est African American: 83 mL/min/{1.73_m2} (ref >=60)
GFR, Est Non African American: 72 mL/min/{1.73_m2} (ref >=60)
Globulin: 2.5 g/dL (calc) (ref 1.9–3.7)
Glucose, Bld: 127 mg/dL — ABNORMAL HIGH (ref 65–99)
Potassium: 5 mmol/L (ref 3.5–5.3)
Sodium: 141 mmol/L (ref 135–146)
Total Bilirubin: 0.4 mg/dL (ref 0.2–1.2)
Total Protein: 7.2 g/dL (ref 6.1–8.1)

## 2020-07-17 LAB — CBC WITH DIFFERENTIAL/PLATELET
Absolute Monocytes: 448 cells/uL (ref 200–950)
Basophils Absolute: 40 cells/uL (ref 0–200)
Basophils Relative: 0.5 %
Eosinophils Absolute: 192 cells/uL (ref 15–500)
Eosinophils Relative: 2.4 %
HCT: 44.4 % (ref 38.5–50.0)
Hemoglobin: 14.4 g/dL (ref 13.2–17.1)
Lymphs Abs: 2344 cells/uL (ref 850–3900)
MCH: 28.3 pg (ref 27.0–33.0)
MCHC: 32.4 g/dL (ref 32.0–36.0)
MCV: 87.2 fL (ref 80.0–100.0)
MPV: 12 fL (ref 7.5–12.5)
Monocytes Relative: 5.6 %
Neutro Abs: 4976 cells/uL (ref 1500–7800)
Neutrophils Relative %: 62.2 %
Platelets: 279 10*3/uL (ref 140–400)
RBC: 5.09 10*6/uL (ref 4.20–5.80)
RDW: 13 % (ref 11.0–15.0)
Total Lymphocyte: 29.3 %
WBC: 8 10*3/uL (ref 3.8–10.8)

## 2020-07-17 LAB — LIPID PANEL
Cholesterol: 116 mg/dL (ref <200)
HDL: 32 mg/dL — ABNORMAL LOW (ref >=40)
LDL Cholesterol (Calc): 61 mg/dL (calc)
Non-HDL Cholesterol (Calc): 84 mg/dL (calc) (ref <130)
Total CHOL/HDL Ratio: 3.6 (calc) (ref <5.0)
Triglycerides: 146 mg/dL (ref <150)

## 2020-07-17 LAB — HEMOGLOBIN A1C
Hgb A1c MFr Bld: 10.4 % of total Hgb — ABNORMAL HIGH (ref <5.7)
Mean Plasma Glucose: 252 (calc)
eAG (mmol/L): 13.9 (calc)

## 2020-07-17 LAB — INSULIN, RANDOM: Insulin: 4.7 u[IU]/mL

## 2020-07-17 LAB — TSH: TSH: 1.71 mIU/L (ref 0.40–4.50)

## 2020-07-17 LAB — URIC ACID: Uric Acid, Serum: 4.3 mg/dL (ref 4.0–8.0)

## 2020-07-17 LAB — VITAMIN D 25 HYDROXY (VIT D DEFICIENCY, FRACTURES): Vit D, 25-Hydroxy: 49 ng/mL (ref 30–100)

## 2020-07-17 LAB — MAGNESIUM: Magnesium: 1.8 mg/dL (ref 1.5–2.5)

## 2020-07-17 NOTE — Progress Notes (Signed)
========================================================== ==========================================================  -     Total Chol = 116 and LDL Chol = 61 - Both  Excellent   - Very low risk for Heart Attack  / Stroke =============================================================  - A1c much worse - up from 8.5% to now 10.4% - way too high    Being diabetic has a  300% increased risk for heart attack,  stroke, cancer, and alzheimer- type vascular dementia.   -  It is very important that you work harder with diet by  avoiding all foods that are white except chicken,   fish & calliflower.  - Avoid white rice  (brown & wild rice is OK),   - Avoid white potatoes  (sweet potatoes in moderation is OK),   White bread or wheat bread or anything made out of   white flour like bagels, donuts, rolls, buns, biscuits, cakes,  - pastries, cookies, pizza crust, and pasta (made from  white flour & egg whites)   - vegetarian pasta or spinach or wheat pasta is OK.  - Multigrain breads like Arnold's, Pepperidge Farm or   multigrain sandwich thins or high fiber breads like   [place] bread or "Dave's Killer" breads that are  4 to 5 grams fiber per slice !  are best.    Diet, exercise and weight loss can reverse and cure  diabetes in the early stages.    - Diet, exercise and weight loss is very important in the   control and prevention of complications of diabetes which  affects every system in your body, ie.   -Brain - dementia/stroke,  - eyes - glaucoma/blindness,  - heart - heart attack/heart failure,  - kidneys - dialysis,  - stomach - gastric paralysis,  - intestines - malabsorption,  - nerves - severe painful neuritis,  - circulation - gangrene & loss of a leg(s)  - and finally  . . . . . . . . . . . . . . . . . .    - cancer and Alzheimers. ==========================================================  -  Vitamin D = 49 - low -   - Vitamin D goal is between 70-100.    - Please INCREASE your Vitamin D up to  5,000 unit caps x 2 caps = 10,000 units every day  - It is very important as a natural anti-inflammatory and helping the  immune system protect against viral infections, like the Covid-19    helping hair, skin, and nails, as well as reducing stroke and  heart attack risk.   - It helps your bones and helps with mood.  - It also decreases numerous cancer risks so please  take it as directed.   - Low Vit D is associated with a 200-300% higher risk for  CANCER   and 200-300% higher risk for HEART   ATTACK  &  STROKE.    - It is also associated with higher death rate at younger ages,   autoimmune diseases like Rheumatoid arthritis, Lupus,  Multiple Sclerosis.     - Also many other serious conditions, like depression, Alzheimer's  Dementia, infertility, muscle aches, fatigue, fibromyalgia   - just to name a few. ==========================================================  - Uric Acid  /Gout test - Normal & OK -Continue Allopurinol same ==========================================================  -  All Else - CBC - Kidneys - Electrolytes - Liver - Magnesium & Thyroid    - all  Normal / OK ==========================================================

## 2020-07-25 ENCOUNTER — Other Ambulatory Visit: Payer: Self-pay | Admitting: Internal Medicine

## 2020-07-25 DIAGNOSIS — E1122 Type 2 diabetes mellitus with diabetic chronic kidney disease: Secondary | ICD-10-CM

## 2020-07-25 DIAGNOSIS — E1121 Type 2 diabetes mellitus with diabetic nephropathy: Secondary | ICD-10-CM

## 2020-08-21 ENCOUNTER — Encounter: Payer: BC Managed Care – PPO | Admitting: Internal Medicine

## 2020-10-11 ENCOUNTER — Encounter: Payer: BC Managed Care – PPO | Admitting: Internal Medicine

## 2020-10-23 ENCOUNTER — Encounter: Payer: BC Managed Care – PPO | Admitting: Internal Medicine

## 2020-11-07 ENCOUNTER — Other Ambulatory Visit: Payer: Self-pay | Admitting: *Deleted

## 2020-11-07 DIAGNOSIS — I472 Ventricular tachycardia, unspecified: Secondary | ICD-10-CM

## 2020-11-07 DIAGNOSIS — E1122 Type 2 diabetes mellitus with diabetic chronic kidney disease: Secondary | ICD-10-CM

## 2020-11-07 MED ORDER — GLUCOSE BLOOD VI STRP: ORAL_STRIP | 1 refills | Status: DC

## 2020-11-07 MED ORDER — ATENOLOL 50 MG PO TABS: ORAL_TABLET | ORAL | 3 refills | Status: DC

## 2020-11-11 ENCOUNTER — Other Ambulatory Visit: Payer: Self-pay | Admitting: Internal Medicine

## 2020-11-11 DIAGNOSIS — I472 Ventricular tachycardia, unspecified: Secondary | ICD-10-CM

## 2020-12-19 ENCOUNTER — Other Ambulatory Visit: Payer: Self-pay

## 2020-12-19 ENCOUNTER — Encounter: Payer: Self-pay | Admitting: Internal Medicine

## 2020-12-19 ENCOUNTER — Ambulatory Visit (INDEPENDENT_AMBULATORY_CARE_PROVIDER_SITE_OTHER): Payer: BC Managed Care – PPO | Admitting: Internal Medicine

## 2020-12-19 VITALS — BP 126/80 | HR 63 | Temp 97.5°F | Resp 16 | Ht 75.5 in | Wt 211.4 lb

## 2020-12-19 DIAGNOSIS — Z79899 Other long term (current) drug therapy: Secondary | ICD-10-CM | POA: Diagnosis not present

## 2020-12-19 DIAGNOSIS — E785 Hyperlipidemia, unspecified: Secondary | ICD-10-CM

## 2020-12-19 DIAGNOSIS — Z8249 Family history of ischemic heart disease and other diseases of the circulatory system: Secondary | ICD-10-CM | POA: Diagnosis not present

## 2020-12-19 DIAGNOSIS — I10 Essential (primary) hypertension: Secondary | ICD-10-CM

## 2020-12-19 DIAGNOSIS — I700 Atherosclerosis of aorta: Secondary | ICD-10-CM | POA: Diagnosis not present

## 2020-12-19 DIAGNOSIS — Z0001 Encounter for general adult medical examination with abnormal findings: Secondary | ICD-10-CM

## 2020-12-19 DIAGNOSIS — Z1211 Encounter for screening for malignant neoplasm of colon: Secondary | ICD-10-CM

## 2020-12-19 DIAGNOSIS — E1169 Type 2 diabetes mellitus with other specified complication: Secondary | ICD-10-CM

## 2020-12-19 DIAGNOSIS — E1122 Type 2 diabetes mellitus with diabetic chronic kidney disease: Secondary | ICD-10-CM

## 2020-12-19 DIAGNOSIS — Z136 Encounter for screening for cardiovascular disorders: Secondary | ICD-10-CM | POA: Diagnosis not present

## 2020-12-19 DIAGNOSIS — N138 Other obstructive and reflux uropathy: Secondary | ICD-10-CM

## 2020-12-19 DIAGNOSIS — E559 Vitamin D deficiency, unspecified: Secondary | ICD-10-CM

## 2020-12-19 DIAGNOSIS — Z125 Encounter for screening for malignant neoplasm of prostate: Secondary | ICD-10-CM

## 2020-12-19 DIAGNOSIS — Z0000 Encounter for general adult medical examination without abnormal findings: Secondary | ICD-10-CM

## 2020-12-19 DIAGNOSIS — M1000 Idiopathic gout, unspecified site: Secondary | ICD-10-CM

## 2020-12-19 NOTE — Progress Notes (Signed)
Annual  Screening/Preventative Visit  & Comprehensive Evaluation & Examination   Future Appointments  Date Time Provider Department Center  12/19/2020 11:00 AM Lucky Cowboy, MD [facility] None  12/23/2021  2:00 PM Lucky Cowboy, MD [facility] None                                               This very nice 65 y.o.  MWM presents for a Screening /Preventative Visit & comprehensive evaluation and management of multiple medical co-morbidities.  Patient has been followed for HTN, HLD, T2_NIDDM  and Vitamin D Deficiency. Patient has Gout controlled on Allopurinol. Abdominal CT scan in 2018 showed Aortic Atherosclerosis.      HTN predates circa 2010. Patient's BP has been controlled at home.  Today's BP is at goal -  126/80. 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  Patient denies any cardiac symptoms as chest pain, palpitations, shortness of breath, dizziness or ankle swelling.      Patient's hyperlipidemia is controlled with diet and Fenofibrate. Patient denies myalgias or other medication SE's. 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 Last A1c was in poor control:  Lab Results  Component Value Date   HGBA1C 10.4 (H) 07/16/2020         Finally, patient has history of Vitamin D Deficiency ("34" /2012)and last vitamin D was sl low (goal 70-100):  Lab Results  Component Value Date   VD25OH 49 07/16/2020    Current Outpatient Medications on File Prior to Visit  Medication Sig  . Allopurinol  300 MG tablet TAKE 1 TABLET DAILY TO PREVENT GOUT  . aspirin EC 81 MG tablet Take  at bedtime.  Marland Kitchen atenolol  50 MG tablet TAKE 1 TABLET DAILY FOR HEART RHYTHM  . SUPER B COMPLEX  Take 1 tablet  daily.  . fenofibrate  134 MG capsule TAKE 1 CAPSULE DAILY TO PREVENT GOUT  . metFORMIN -XR 500 MG  TAKE 2 TABLETS TWICE A DAY WITH MEALS   . montelukast  10 MG tablet TAKE 1 TABLET DAILY FOR ALLERGIES  . Multiple Vitamin  Take  daily.  . Omega-3 FISH OIL 1000 MG  Take  2 times daily.   . OTC Advil  Takes PRN for headache  . Vitamin D, Cholecalciferol, 1000 UNITS TABS Take  2 times daily.      Allergies  Allergen Reactions  . Levaquin [Levofloxacin]     GI Upset  . Valtrex [Valacyclovir Hcl] Hives    Past Medical History:  Diagnosis Date  . Calculus of gallbladder with acute cholecystitis 11/08/2014   Via CT AB 2015   . Diabetes mellitus without complication (HCC)   . Fatty liver disease, nonalcoholic 11/08/2014  . Gout 07/17/2014  . H/O cluster headache   . History of nephrolithiasis 11/08/2014  . 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 Vitamin D deficiency     Health Maintenance  Topic Date Due  . COVID-19 Vaccine (1) Never done  . OPHTHALMOLOGY EXAM  Never done  .  COLONOSCOPY (Pts 45-42yrs Insurance coverage will need to be confirmed)  Never done  . FOOT EXAM  09/30/2020  . URINE MICROALBUMIN  10/01/2020  . HEMOGLOBIN A1C  01/13/2021  . PNA vac Low Risk Adult (2 of 2 - PPSV23) 02/06/2021  . INFLUENZA VACCINE  03/31/2021  . TETANUS/TDAP  09/29/2025  . Hepatitis C Screening  Completed  . HPV VACCINES  Aged Out    Immunization History  Administered Date(s) Administered  . Influenza Inj Mdck Quad With Preservative 05/20/2017, 06/08/2018  . Influenza Split 05/15/2013, 07/17/2014, 06/18/2015  . Influenza, High Dose Seasonal PF 10/02/2019, 07/16/2020  . Influenza, Seasonal, Injecte, Preservative Fre 09/08/2016  . PPD Test 07/17/2014, 09/30/2015, 02/02/2017, 06/08/2018  . Pneumococcal Conjugate-13 10/02/2019  . Pneumococcal Polysaccharide-23 02/07/2016  . Tdap 09/30/2015    Last Colon - 2005  (at age 62 yo) per Dr Kinnie Scales and overdue 10 year f/u in  2015  Past Surgical History:  Procedure Laterality Date  . NASAL SINUS SURGERY    . TOE SURGERY    . VARICOCELE EXCISION      Family History  Problem Relation Age of Onset  . Hypertension Father   . Diabetes Father   . Heart attack Father 4    Social History   Socioeconomic History  . Marital status: Married    Spouse name: Not on file  . Number of children: 1  . Years of education: Not on file  . Highest education level: Not on file  Occupational History  . Occupation: Biochemist, clinical  Tobacco Use  . Smoking status: Never Smoker  . Smokeless tobacco: Never Used  Vaping Use  . Vaping Use: Never used  Substance and Sexual Activity  . Alcohol use: No  . Drug use: No  . Sexual activity: Not on file    ROS Constitutional: Denies fever, chills, weight loss/gain, headaches, insomnia,  night sweats or change in appetite. Does c/o fatigue. Eyes: Denies redness, blurred vision, diplopia, discharge, itchy or watery eyes.  ENT: Denies discharge, congestion, post nasal drip, epistaxis, sore throat, earache, hearing loss, dental pain, Tinnitus, Vertigo, Sinus pain or snoring.  Cardio: Denies chest pain, palpitations, irregular heartbeat, syncope, dyspnea, diaphoresis, orthopnea, PND, claudication or edema Respiratory: denies cough, dyspnea, DOE, pleurisy, hoarseness, laryngitis or wheezing.  Gastrointestinal: Denies dysphagia, heartburn, reflux, water brash, pain, cramps, nausea, vomiting, bloating, diarrhea, constipation, hematemesis, melena, hematochezia, jaundice or hemorrhoids Genitourinary: Denies dysuria, frequency, urgency, nocturia, hesitancy, discharge, hematuria or flank pain Musculoskeletal: Denies arthralgia, myalgia, stiffness, Jt. Swelling, pain, limp or strain/sprain. Denies Falls. Skin: Denies puritis, rash, hives, warts, acne, eczema or change in skin lesion Neuro: No weakness, tremor, incoordination, spasms, paresthesia or pain Psychiatric: Denies confusion,  memory loss or sensory loss. Denies Depression. Endocrine: Denies change in weight, skin, hair change, nocturia, and paresthesia, diabetic polys, visual blurring or hyper / hypo glycemic episodes.  Heme/Lymph: No excessive bleeding, bruising or enlarged lymph nodes.  Physical Exam  BP 126/80   Pulse 63   Temp (!) 97.5 F (36.4 C)   Resp 16   Ht 6' 3.5" (1.918 m)   Wt 211 lb 6.4 oz (95.9 kg)   SpO2 97%   BMI 26.07 kg/m   General Appearance: Well nourished and well groomed and in no apparent distress.  Eyes: PERRLA, EOMs, conjunctiva no swelling or erythema, normal fundi and vessels. Sinuses: No frontal/maxillary tenderness ENT/Mouth: EACs patent / TMs  nl. Nares clear without erythema, swelling, mucoid exudates. Oral hygiene is good. No erythema, swelling, or  exudate. Tongue normal, non-obstructing. Tonsils not swollen or erythematous. Hearing normal.  Neck: Supple, thyroid not palpable. No bruits, nodes or JVD. Respiratory: Respiratory effort normal.  BS equal and clear bilateral without rales, rhonci, wheezing or stridor. Cardio: Heart sounds are normal with regular rate and rhythm and no murmurs, rubs or gallops. Peripheral pulses are normal and equal bilaterally without edema. No aortic or femoral bruits. Chest: symmetric with normal excursions and percussion.  Abdomen: Soft, with Nl bowel sounds. Nontender, no guarding, rebound, hernias, masses, or organomegaly.  Lymphatics: Non tender without lymphadenopathy.  Musculoskeletal: Full ROM all peripheral extremities, joint stability, 5/5 strength, and normal gait. Skin: Warm and dry without rashes, lesions, cyanosis, clubbing or  ecchymosis.  Neuro: Cranial nerves intact, reflexes equal bilaterally. Normal muscle tone, no cerebellar symptoms. Sensation intact.  Pysch: Alert and oriented X 3 with normal affect, insight and judgment appropriate.   Assessment and Plan  1. Annual Preventative/Screening Exam    2. Essential  hypertension  - EKG 12-Lead - [place], RETROPERITNL ABD,  LTD - COMPLETE METABOLIC PANEL WITH GFR - Magnesium - TSH - CBC with Differential/Platelet  3. Hyperlipidemia associated with type 2 diabetes mellitus (HCC)  - EKG 12-Lead - [place], RETROPERITNL ABD,  LTD - Lipid panel - TSH  4. Type 2 diabetes mellitus with stage 2 chronic kidney  disease, without long-term current use of insulin (HCC)  - EKG 12-Lead - [place], RETROPERITNL ABD,  LTD - HM DIABETES FOOT EXAM - LOW EXTREMITY NEUR EXAM DOCUM - Hemoglobin A1c - Insulin, random  5. Vitamin D deficiency  - VITAMIN D 25 Hydroxy (Vit-D Deficiency, Fractures)  6. Abdominal aortic atherosclerosis (HCC) by Abd CT scan 2018   - Lipid panel  7. Idiopathic gout  - Uric acid  8. BPH with obstruction/lower urinary tract symptoms  - PSA  9. Screening for colorectal cancer  - Ambulatory referral to Gastroenterology  10. Prostate cancer screening  - PSA  11. Screening for ischemic heart disease  - EKG 12-Lead  12. FHx: heart disease  - EKG 12-Lead - [place], RETROPERITNL ABD,  LTD  13. Screening for AAA (aortic abdominal aneurysm)  - [place], RETROPERITNL ABD,  LTD  14. Medication management  - Urinalysis, Routine w reflex microscopic - Microalbumin / creatinine urine ratio - Uric acid - COMPLETE METABOLIC PANEL WITH GFR - Magnesium - Lipid panel - TSH - Hemoglobin A1c - Insulin, random - VITAMIN D 25 Hydroxy  - CBC with Differential/Platelet           Patient was counseled in prudent diet, weight control to achieve/maintain BMI less than 25, BP monitoring, regular exercise and medications as discussed.  Discussed med effects and SE's. Routine screening labs and tests as requested with regular follow-up as recommended. Over 40 minutes of exam, counseling, chart review and high complex critical decision making was performed   Marinus Maw, MD

## 2020-12-19 NOTE — Progress Notes (Signed)
AortaScan < 3 cm. Within normal limits, per Dr McKeown. 

## 2020-12-19 NOTE — Patient Instructions (Signed)

## 2020-12-20 LAB — MICROALBUMIN / CREATININE URINE RATIO
Creatinine, Urine: 129 mg/dL (ref 20–320)
Microalb Creat Ratio: 6 mcg/mg creat (ref <30)
Microalb, Ur: 0.8 mg/dL

## 2020-12-20 LAB — URINALYSIS, ROUTINE W REFLEX MICROSCOPIC
Bilirubin Urine: NEGATIVE
Hgb urine dipstick: NEGATIVE
Ketones, ur: NEGATIVE
Leukocytes,Ua: NEGATIVE
Nitrite: NEGATIVE
Protein, ur: NEGATIVE
Specific Gravity, Urine: 1.026 (ref 1.001–1.035)
pH: <=5 (ref 5.0–8.0)

## 2020-12-20 LAB — CBC WITH DIFFERENTIAL/PLATELET
Absolute Monocytes: 442 cells/uL (ref 200–950)
Basophils Absolute: 32 cells/uL (ref 0–200)
Basophils Relative: 0.5 %
Eosinophils Absolute: 160 cells/uL (ref 15–500)
Eosinophils Relative: 2.5 %
HCT: 40.2 % (ref 38.5–50.0)
Hemoglobin: 13.2 g/dL (ref 13.2–17.1)
Lymphs Abs: 2118 cells/uL (ref 850–3900)
MCH: 29.5 pg (ref 27.0–33.0)
MCHC: 32.8 g/dL (ref 32.0–36.0)
MCV: 89.7 fL (ref 80.0–100.0)
MPV: 12.2 fL (ref 7.5–12.5)
Monocytes Relative: 6.9 %
Neutro Abs: 3648 cells/uL (ref 1500–7800)
Neutrophils Relative %: 57 %
Platelets: 234 10*3/uL (ref 140–400)
RBC: 4.48 10*6/uL (ref 4.20–5.80)
RDW: 12.9 % (ref 11.0–15.0)
Total Lymphocyte: 33.1 %
WBC: 6.4 10*3/uL (ref 3.8–10.8)

## 2020-12-20 LAB — LIPID PANEL
Cholesterol: 115 mg/dL (ref <200)
HDL: 27 mg/dL — ABNORMAL LOW (ref >=40)
LDL Cholesterol (Calc): 62 mg/dL (calc)
Non-HDL Cholesterol (Calc): 88 mg/dL (calc) (ref <130)
Total CHOL/HDL Ratio: 4.3 (calc) (ref <5.0)
Triglycerides: 181 mg/dL — ABNORMAL HIGH (ref <150)

## 2020-12-20 LAB — MAGNESIUM: Magnesium: 1.6 mg/dL (ref 1.5–2.5)

## 2020-12-20 LAB — COMPLETE METABOLIC PANEL WITH GFR
AG Ratio: 1.9 (calc) (ref 1.0–2.5)
ALT: 17 U/L (ref 9–46)
AST: 12 U/L (ref 10–35)
Albumin: 4.7 g/dL (ref 3.6–5.1)
Alkaline phosphatase (APISO): 71 U/L (ref 35–144)
BUN: 21 mg/dL (ref 7–25)
CO2: 27 mmol/L (ref 20–32)
Calcium: 10 mg/dL (ref 8.6–10.3)
Chloride: 101 mmol/L (ref 98–110)
Creat: 1.06 mg/dL (ref 0.70–1.25)
GFR, Est African American: 84 mL/min/{1.73_m2} (ref >=60)
GFR, Est Non African American: 73 mL/min/{1.73_m2} (ref >=60)
Globulin: 2.5 g/dL (calc) (ref 1.9–3.7)
Glucose, Bld: 273 mg/dL — ABNORMAL HIGH (ref 65–99)
Potassium: 4.8 mmol/L (ref 3.5–5.3)
Sodium: 135 mmol/L (ref 135–146)
Total Bilirubin: 0.5 mg/dL (ref 0.2–1.2)
Total Protein: 7.2 g/dL (ref 6.1–8.1)

## 2020-12-20 LAB — VITAMIN D 25 HYDROXY (VIT D DEFICIENCY, FRACTURES): Vit D, 25-Hydroxy: 97 ng/mL (ref 30–100)

## 2020-12-20 LAB — PSA: PSA: 0.19 ng/mL (ref <=4.00)

## 2020-12-20 LAB — URIC ACID: Uric Acid, Serum: 2.6 mg/dL — ABNORMAL LOW (ref 4.0–8.0)

## 2020-12-20 LAB — HEMOGLOBIN A1C
Hgb A1c MFr Bld: 12.6 % of total Hgb — ABNORMAL HIGH (ref <5.7)
Mean Plasma Glucose: 315 mg/dL
eAG (mmol/L): 17.4 mmol/L

## 2020-12-20 LAB — TSH: TSH: 1.69 mIU/L (ref 0.40–4.50)

## 2020-12-20 LAB — INSULIN, RANDOM: Insulin: 7.5 u[IU]/mL

## 2020-12-21 ENCOUNTER — Encounter: Payer: Self-pay | Admitting: Internal Medicine

## 2020-12-21 ENCOUNTER — Other Ambulatory Visit: Payer: Self-pay | Admitting: Internal Medicine

## 2020-12-21 DIAGNOSIS — E1122 Type 2 diabetes mellitus with diabetic chronic kidney disease: Secondary | ICD-10-CM

## 2020-12-21 MED ORDER — GLIPIZIDE 5 MG PO TABS: ORAL_TABLET | ORAL | 1 refills | Status: DC

## 2021-01-03 DIAGNOSIS — E1165 Type 2 diabetes mellitus with hyperglycemia: Secondary | ICD-10-CM | POA: Insufficient documentation

## 2021-01-03 NOTE — Progress Notes (Signed)
FOLLOW UP  Assessment and Plan:   Diabetes - poorly controlled Continue medication: metformin 2000 mg New glipizide 5 mg TID -  Concern with possible nocturnal hypoglycemia with PM readings lower than fasting Try checking glucose at 2am - if this is lower than fasting, need to reduce glipizide Reviewed diet - try 1/2 tab only with low carb/small meals, whole tab with larger meals Low processed carb/starch diet reviewed at length  Continue diet and exercise.  Perform daily foot/skin check, notify office of any concerning changes.  Check fructosamine  Hypertension Well controlled with current medications  Monitor blood pressure at home; patient to call if consistently greater than 130/80 Continue DASH diet.   Reminder to go to the ER if any CP, SOB, nausea, dizziness, severe HA, changes vision/speech, left arm numbness and tingling and jaw pain.  Overweight with comorbidities Long discussion about weight loss, diet, and exercise Recommended diet heavy in fruits and veggies and low in animal meats, cheeses, and dairy products, appropriate calorie intake Discussed ideal weight for height  Will follow up in 3 months  Continue diet and meds as discussed. Further disposition pending results of labs. Discussed med's effects and SE's.   Over 15 minutes of exam, counseling, chart review, and critical decision making was performed.   Future Appointments  Date Time Provider Department Center  04/09/2021  2:30 PM Judd Gaudier, NP [facility] None  07/14/2021  3:30 PM Lucky Cowboy, MD [facility] None  12/23/2021  2:00 PM Lucky Cowboy, MD [facility] None    ----------------------------------------------------------------------------------------------------------------------  HPI 65 y.o. male presents for 2 week follow up on T2DM.     He has been working on diet (has cut out bread, had replaced sweets) and exercise for T2DM, has been on metformin 2000 mg daily, glipizide was added  and advised to check glucose BID and keep log. He reports has been taking 5 mg glipizide TID with meals in addition to metformin 2000 mg daily. He has been checking fasting (135-167), and has been checking at bedtime (85-185). Typically fasting higher than HS check.  He denies hyperglycemia, increased appetite, nausea, paresthesia of the feet, polydipsia, polyuria and visual disturbances. Last A1C in the office was:  Lab Results  Component Value Date   HGBA1C 12.6 (H) 12/19/2020   Component     Latest Ref Rng & Units 02/07/2016 05/13/2016 09/08/2016 02/02/2017  Hemoglobin A1C     <5.7 % of total Hgb 6.7 (H) 6.5 (H) 6.7 (H) 7.0 (H)   Component     Latest Ref Rng & Units 05/20/2017 09/13/2017 01/06/2018 06/08/2018  Hemoglobin A1C     <5.7 % of total Hgb 6.5 (H) 7.4 (H) 7.9 (H) 7.0 (H)   Component     Latest Ref Rng & Units 01/19/2019 10/02/2019 07/16/2020 12/19/2020  Hemoglobin A1C     <5.7 % of total Hgb 8.5 (H) 8.5 (H) 10.4 (H) 12.6 (H)    BMI is Body mass index is 26.4 kg/m., he has been working on diet and exercise, doing boiled eggs for breakfast, salads, broccoli with protein for dinner.  Wt Readings from Last 3 Encounters:  01/06/21 214 lb (97.1 kg)  12/19/20 211 lb 6.4 oz (95.9 kg)  07/16/20 217 lb (98.4 kg)     Current Medications:  Current Outpatient Medications on File Prior to Visit  Medication Sig  . allopurinol (ZYLOPRIM) 300 MG tablet TAKE 1 TABLET DAILY TO PREVENT GOUT  . aspirin EC 81 MG tablet Take 81 mg by mouth at  bedtime.  Marland Kitchen atenolol (TENORMIN) 50 MG tablet TAKE 1 TABLET DAILY FOR HEART RHYTHM  . B Complex-C (SUPER B COMPLEX PO) Take 1 tablet by mouth daily.  . fenofibrate micronized (LOFIBRA) 134 MG capsule TAKE 1 CAPSULE DAILY TO PREVENT GOUT  . glipiZIDE (GLUCOTROL) 5 MG tablet Take  1 tablet  3 x /day   with Meals  for Diabetes  . glucose blood test strip Check blood sugar 1 time daily. [id number]  . metFORMIN (GLUCOPHAGE-XR) 500 MG 24 hr tablet TAKE 2 TABLETS TWICE A  DAY WITH MEALS FOR DIABETES  . montelukast (SINGULAIR) 10 MG tablet TAKE 1 TABLET DAILY FOR ALLERGIES  . Multiple Vitamin (MULTIVITAMIN PO) Take by mouth daily.  . Omega-3 Fatty Acids (FISH OIL) 1000 MG CAPS Take 1,000 mg by mouth 2 (two) times daily.   Marland Kitchen OVER THE COUNTER MEDICATION Takes OTC Advil PRN for headache  . Vitamin D, Cholecalciferol, 1000 UNITS TABS Take 1,000 mg by mouth 2 (two) times daily.    No current facility-administered medications on file prior to visit.     Allergies:  Allergies  Allergen Reactions  . Levaquin [Levofloxacin]     GI Upset  . Valtrex [Valacyclovir Hcl] Hives     Medical History:  Past Medical History:  Diagnosis Date  . Calculus of gallbladder with acute cholecystitis 11/08/2014   Via CT AB 2015   . Diabetes mellitus without complication (HCC)   . Fatty liver disease, nonalcoholic 11/08/2014  . Gout 07/17/2014  . H/O cluster headache   . History of nephrolithiasis 11/08/2014  . 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 Vitamin D deficiency    Family history- Reviewed and unchanged Social history- Reviewed and unchanged   Review of Systems:  Review of Systems  Constitutional: Negative for malaise/fatigue and weight loss.  HENT: Negative for hearing loss and tinnitus.   Eyes: Negative for blurred vision and double vision.  Respiratory: Negative for cough, shortness of breath and wheezing.   Cardiovascular: Negative for chest pain, palpitations, orthopnea, claudication and leg swelling.  Gastrointestinal: Negative for abdominal pain, blood in stool, constipation, diarrhea, heartburn, melena, nausea and vomiting.  Genitourinary: Negative.   Musculoskeletal: Negative for joint pain and myalgias.  Skin: Negative for rash.  Neurological: Negative for dizziness, tingling, sensory change, weakness and headaches.  Endo/Heme/Allergies: Negative for polydipsia.  Psychiatric/Behavioral: Negative.   All other systems reviewed and are  negative.     Physical Exam: BP 110/78   Pulse 68   Temp (!) 97.5 F (36.4 C)   Wt 214 lb (97.1 kg)   SpO2 98%   BMI 26.40 kg/m  Wt Readings from Last 3 Encounters:  01/06/21 214 lb (97.1 kg)  12/19/20 211 lb 6.4 oz (95.9 kg)  07/16/20 217 lb (98.4 kg)   General Appearance: Well nourished, in no apparent distress. Eyes: PERRLA, EOMs, conjunctiva no swelling or erythema Sinuses: No Frontal/maxillary tenderness ENT/Mouth: Ext aud canals clear, TMs without erythema, bulging. No erythema, swelling, or exudate on post pharynx.  Tonsils not swollen or erythematous. Hearing normal.  Neck: Supple, thyroid normal.  Respiratory: Respiratory effort normal, BS equal bilaterally without rales, rhonchi, wheezing or stridor.  Cardio: RRR with no MRGs. Brisk peripheral pulses without edema.  Abdomen: Soft, + BS.  Non tender, no guarding, rebound, hernias, masses. Lymphatics: Non tender without lymphadenopathy.  Musculoskeletal: Full ROM, 5/5 strength, Normal gait Skin: Warm, dry without rashes, lesions, ecchymosis.  Neuro: Cranial nerves intact. No cerebellar symptoms.  Psych: Awake and  oriented X 3, normal affect, Insight and Judgment appropriate.    Dan Maker, NP 2:03 PM [facility]

## 2021-01-06 ENCOUNTER — Other Ambulatory Visit: Payer: Self-pay

## 2021-01-06 ENCOUNTER — Encounter: Payer: Self-pay | Admitting: Adult Health

## 2021-01-06 ENCOUNTER — Ambulatory Visit (INDEPENDENT_AMBULATORY_CARE_PROVIDER_SITE_OTHER): Payer: BC Managed Care – PPO | Admitting: Adult Health

## 2021-01-06 VITALS — BP 110/78 | HR 68 | Temp 97.5°F | Wt 214.0 lb

## 2021-01-06 DIAGNOSIS — N182 Chronic kidney disease, stage 2 (mild): Secondary | ICD-10-CM

## 2021-01-06 DIAGNOSIS — E1122 Type 2 diabetes mellitus with diabetic chronic kidney disease: Secondary | ICD-10-CM | POA: Diagnosis not present

## 2021-01-06 DIAGNOSIS — E1165 Type 2 diabetes mellitus with hyperglycemia: Secondary | ICD-10-CM | POA: Diagnosis not present

## 2021-01-06 DIAGNOSIS — E1169 Type 2 diabetes mellitus with other specified complication: Secondary | ICD-10-CM

## 2021-01-06 DIAGNOSIS — E663 Overweight: Secondary | ICD-10-CM

## 2021-01-06 DIAGNOSIS — I10 Essential (primary) hypertension: Secondary | ICD-10-CM | POA: Diagnosis not present

## 2021-01-06 MED ORDER — GLIPIZIDE 5 MG PO TABS: ORAL_TABLET | ORAL | 1 refills | Status: DC

## 2021-01-06 NOTE — Patient Instructions (Signed)
Sugar checked prior to bedtime should always be higher than fasting - try checking 2-3 am glucose a few days, if these are lower than fasting number in the morning, need to reduce glipizide dose  Suggest try 1/2 tab glipizide with smaller/low carb meals, can take whole tab with larger meals with higher starch or carbohydrate. Recommend choosing high fiber sources of carbs (beans, whole grains, fresh fruits, non-starchy veggies) with healthy fats and lean protein.   Continue keeping close log, can also try keeping food log to compare with glucose readings to help learn how your sugars respond to different foods - please bring with you to each visit.      https://www.diabeteseducator.org/docs/default-source/living-with-diabetes/conquering-the-grocery-store-v1.pdf?sfvrsn=4">  Carbohydrate Counting for Diabetes Mellitus, Adult Carbohydrate counting is a method of keeping track of how many carbohydrates you eat. Eating carbohydrates naturally increases the amount of sugar (glucose) in the blood. Counting how many carbohydrates you eat improves your blood glucose control, which helps you manage your diabetes. It is important to know how many carbohydrates you can safely have in each meal. This is different for every person. A dietitian can help you make a meal plan and calculate how many carbohydrates you should have at each meal and snack. What foods contain carbohydrates? Carbohydrates are found in the following foods:  Grains, such as breads and cereals.  Dried beans and soy products.  Starchy vegetables, such as potatoes, peas, and corn.  Fruit and fruit juices.  Milk and yogurt.  Sweets and snack foods, such as cake, cookies, candy, chips, and soft drinks.   How do I count carbohydrates in foods? There are two ways to count carbohydrates in food. You can read food labels or learn standard serving sizes of foods. You can use either of the methods or a combination of both. Using the  Nutrition Facts label The Nutrition Facts list is included on the labels of almost all packaged foods and beverages in the [place] It includes:  The serving size.  Information about nutrients in each serving, including the grams (g) of carbohydrate per serving. To use the Nutrition Facts:  Decide how many servings you will have.  Multiply the number of servings by the number of carbohydrates per serving.  The resulting number is the total amount of carbohydrates that you will be having. Learning the standard serving sizes of foods When you eat carbohydrate foods that are not packaged or do not include Nutrition Facts on the label, you need to measure the servings in order to count the amount of carbohydrates.  Measure the foods that you will eat with a food scale or measuring cup, if needed.  Decide how many standard-size servings you will eat.  Multiply the number of servings by 15. For foods that contain carbohydrates, one serving equals 15 g of carbohydrates. ? For example, if you eat 2 cups or 10 oz (300 g) of strawberries, you will have eaten 2 servings and 30 g of carbohydrates (2 servings x 15 g = 30 g).  For foods that have more than one food mixed, such as soups and casseroles, you must count the carbohydrates in each food that is included. The following list contains standard serving sizes of common carbohydrate-rich foods. Each of these servings has about 15 g of carbohydrates:  1 slice of bread.  1 six-inch (15 cm) tortilla.  ? cup or 2 oz (53 g) cooked rice or pasta.   cup or 3 oz (85 g) cooked or canned, drained and rinsed  beans or lentils.   cup or 3 oz (85 g) starchy vegetable, such as peas, corn, or squash.   cup or 4 oz (120 g) hot cereal.   cup or 3 oz (85 g) boiled or mashed potatoes, or  or 3 oz (85 g) of a large baked potato.   cup or 4 fl oz (118 mL) fruit juice.  1 cup or 8 fl oz (237 mL) milk.  1 small or 4 oz (106 g) apple.   or 2 oz (63 g)  of a medium banana.  1 cup or 5 oz (150 g) strawberries.  3 cups or 1 oz (24 g) popped popcorn. What is an example of carbohydrate counting? To calculate the number of carbohydrates in this sample meal, follow the steps shown below. Sample meal  3 oz (85 g) chicken breast.  ? cup or 4 oz (106 g) brown rice.   cup or 3 oz (85 g) corn.  1 cup or 8 fl oz (237 mL) milk.  1 cup or 5 oz (150 g) strawberries with sugar-free whipped topping. Carbohydrate calculation 1. Identify the foods that contain carbohydrates: ? Rice. ? Corn. ? Milk. ? Strawberries. 2. Calculate how many servings you have of each food: ? 2 servings rice. ? 1 serving corn. ? 1 serving milk. ? 1 serving strawberries. 3. Multiply each number of servings by 15 g: ? 2 servings rice x 15 g = 30 g. ? 1 serving corn x 15 g = 15 g. ? 1 serving milk x 15 g = 15 g. ? 1 serving strawberries x 15 g = 15 g. 4. Add together all of the amounts to find the total grams of carbohydrates eaten: ? 30 g + 15 g + 15 g + 15 g = 75 g of carbohydrates total. What are tips for following this plan? Shopping  Develop a meal plan and then make a shopping list.  Buy fresh and frozen vegetables, fresh and frozen fruit, dairy, eggs, beans, lentils, and whole grains.  Look at food labels. Choose foods that have more fiber and less sugar.  Avoid processed foods and foods with added sugars. Meal planning  Aim to have the same amount of carbohydrates at each meal and for each snack time.  Plan to have regular, balanced meals and snacks. Where to find more information  American Diabetes Association: www.diabetes.org  Centers for Disease Control and Prevention: FootballExhibition.com.br Summary  Carbohydrate counting is a method of keeping track of how many carbohydrates you eat.  Eating carbohydrates naturally increases the amount of sugar (glucose) in the blood.  Counting how many carbohydrates you eat improves your blood glucose control,  which helps you manage your diabetes.  A dietitian can help you make a meal plan and calculate how many carbohydrates you should have at each meal and snack. This information is not intended to replace advice given to you by your health care provider. Make sure you discuss any questions you have with your health care provider. Document Revised: 08/17/2019 Document Reviewed: 08/18/2019 Elsevier Patient Education  2021 Elsevier Inc.   High-Fiber Eating Plan Fiber, also called dietary fiber, is a type of carbohydrate. It is found foods such as fruits, vegetables, whole grains, and beans. A high-fiber diet can have many health benefits. Your health care provider may recommend a high-fiber diet to help:  Prevent constipation. Fiber can make your bowel movements more regular.  Lower your cholesterol.  Relieve the following conditions: ? Inflammation of veins  in the anus (hemorrhoids). ? Inflammation of specific areas of the digestive tract (uncomplicated diverticulosis). ? A problem of the large intestine, also called the colon, that sometimes causes pain and diarrhea (irritable bowel syndrome, or IBS).  Prevent overeating as part of a weight-loss plan.  Prevent heart disease, type 2 diabetes, and certain cancers. What are tips for following this plan? Reading food labels  Check the nutrition facts label on food products for the amount of dietary fiber. Choose foods that have 5 grams of fiber or more per serving.  The goals for recommended daily fiber intake include: ? Men (age 67 or younger): 34-38 g. ? Men (over age 67): 28-34 g. ? Women (age 67 or younger): 25-28 g. ? Women (over age 67): 22-25 g. Your daily fiber goal is _____________ g.   Shopping  Choose whole fruits and vegetables instead of processed forms, such as apple juice or applesauce.  Choose a wide variety of high-fiber foods such as avocados, lentils, oats, and kidney beans.  Read the nutrition facts label of the  foods you choose. Be aware of foods with added fiber. These foods often have high sugar and sodium amounts per serving. Cooking  Use whole-grain flour for baking and cooking.  Cook with brown rice instead of white rice. Meal planning  Start the day with a breakfast that is high in fiber, such as a cereal that contains 5 g of fiber or more per serving.  Eat breads and cereals that are made with whole-grain flour instead of refined flour or white flour.  Eat brown rice, bulgur wheat, or millet instead of white rice.  Use beans in place of meat in soups, salads, and pasta dishes.  Be sure that half of the grains you eat each day are whole grains. General information  You can get the recommended daily intake of dietary fiber by: ? Eating a variety of fruits, vegetables, grains, nuts, and beans. ? Taking a fiber supplement if you are not able to take in enough fiber in your diet. It is better to get fiber through food than from a supplement.  Gradually increase how much fiber you consume. If you increase your intake of dietary fiber too quickly, you may have bloating, cramping, or gas.  Drink plenty of water to help you digest fiber.  Choose high-fiber snacks, such as berries, raw vegetables, nuts, and popcorn. What foods should I eat? Fruits Berries. Pears. Apples. Oranges. Avocado. Prunes and raisins. Dried figs. Vegetables Sweet potatoes. Spinach. Kale. Artichokes. Cabbage. Broccoli. Cauliflower. Green peas. Carrots. Squash. Grains Whole-grain breads. Multigrain cereal. Oats and oatmeal. Brown rice. Barley. Bulgur wheat. Millet. Quinoa. Bran muffins. Popcorn. Rye wafer crackers. Meats and other proteins Navy beans, kidney beans, and pinto beans. Soybeans. Split peas. Lentils. Nuts and seeds. Dairy Fiber-fortified yogurt. Beverages Fiber-fortified soy milk. Fiber-fortified orange juice. Other foods Fiber bars. The items listed above may not be a complete list of recommended  foods and beverages. Contact a dietitian for more information. What foods should I avoid? Fruits Fruit juice. Cooked, strained fruit. Vegetables Fried potatoes. Canned vegetables. Well-cooked vegetables. Grains White bread. Pasta made with refined flour. White rice. Meats and other proteins Fatty cuts of meat. Fried chicken or fried fish. Dairy Milk. Yogurt. Cream cheese. Sour cream. Fats and oils Butters. Beverages Soft drinks. Other foods Cakes and pastries. The items listed above may not be a complete list of foods and beverages to avoid. Talk with your dietitian about what choices are best for  you. Summary  Fiber is a type of carbohydrate. It is found in foods such as fruits, vegetables, whole grains, and beans.  A high-fiber diet has many benefits. It can help to prevent constipation, lower blood cholesterol, aid weight loss, and reduce your risk of heart disease, diabetes, and certain cancers.  Increase your intake of fiber gradually. Increasing fiber too quickly may cause cramping, bloating, and gas. Drink plenty of water while you increase the amount of fiber you consume.  The best sources of fiber include whole fruits and vegetables, whole grains, nuts, seeds, and beans. This information is not intended to replace advice given to you by your health care provider. Make sure you discuss any questions you have with your health care provider. Document Revised: 12/21/2019 Document Reviewed: 12/21/2019 Elsevier Patient Education  2021 [employer].

## 2021-01-09 LAB — FRUCTOSAMINE: Fructosamine: 307 umol/L — ABNORMAL HIGH (ref 205–285)

## 2021-01-14 ENCOUNTER — Other Ambulatory Visit: Payer: Self-pay | Admitting: Internal Medicine

## 2021-01-14 MED ORDER — BENZONATATE 200 MG PO CAPS: ORAL_CAPSULE | ORAL | 1 refills | Status: DC

## 2021-01-14 MED ORDER — AZITHROMYCIN 250 MG PO TABS: ORAL_TABLET | ORAL | 0 refills | Status: DC

## 2021-01-14 MED ORDER — PROMETHAZINE-DM 6.25-15 MG/5ML PO SYRP: ORAL_SOLUTION | ORAL | 1 refills | Status: DC

## 2021-01-21 ENCOUNTER — Other Ambulatory Visit: Payer: Self-pay | Admitting: Adult Health

## 2021-01-21 DIAGNOSIS — M1000 Idiopathic gout, unspecified site: Secondary | ICD-10-CM

## 2021-01-21 NOTE — Progress Notes (Signed)
Assessment and Plan:  Diagnoses and all orders for this visit:  Salivary gland swelling Submandibular gland tenderness Jaw pain Exquisite pain and limited ROM in jaw limits exam, however appears salivary gland etiology with cervical note involvement possible; no fever/chills Possible stone - suck on sour candies Will give steroid and PRN tramadol due to pain severity and obvious inflammation, will proceed with coverage for infection will proceed with course of moxifloxacin -non-toxic appearing today, denies fever/chills Discussed possible need for imaging though unable to coordinate for today,  with close patient follow up - low threshold for CT pending progress tomorrow Will go to the ER if worsening headache, changes vision/speech, imbalance, weakness. -     predniSONE (DELTASONE) 20 MG tablet; 2 tablets daily for 3 days, 1 tablet daily for 4 days. -     traMADol (ULTRAM) 50 MG tablet; 1/2-1 tablet q6 hours as needed for severe pain. -     moxifloxacin (AVELOX) 400 MG tablet; Take 1 tablet (400 mg total) by mouth daily at 8 pm for 7 days.  Further disposition pending results of labs. Discussed med's effects and SE's.   Over 30 minutes of exam, counseling, chart review, and critical decision making was performed.   Future Appointments  Date Time Provider Department Center  04/09/2021  2:30 PM Judd Gaudier, NP [facility] None  07/14/2021  3:30 PM Lucky Cowboy, MD [facility] None  12/23/2021  2:00 PM Lucky Cowboy, MD [facility] None    ------------------------------------------------------------------------------------------------------------------   HPI 65 y.o.male, poorly controlled T2DM presents for evaluation of  He reports 3 weeks ago was working in yard, lots of dust, developed congestion and HA, on the left, Dr. Oneta Rack sent in zpak, did improve HA but completed 4 days ago, since then has recurrent sinus pressure, pressure behind eyes, reports pressure in left maxillary  sinus. Notes aching in teeth, jaw. Moving tongue is painful and notes painful to open mouth, seems shooting pain down jaw, tingling intermittently in lower jaw. Reports pressure sensation in left ear without tinnitis. Tenderness in left neck nodes.   Went to dentist 3 days ago, denied abnormal findings. Notes has significantly worsened since then. Denies fever/chills. HA worse with supine position. Denies dizziness, vision changes, stiff neck, rash, GI sx, myalgias.   Has been taking tylenol/ibuprofen for pain but denies benefit.   Has tried flonase but seems to irritate/feels raw. Did try saline irrigation x 4 with some mild benefit.   Watery eyes, left nostril dripping - taking Singulair, fairly controlled  Reports much improved fasting, has been ranging 89-127.  Lab Results  Component Value Date   HGBA1C 12.6 (H) 12/19/2020     Lab Results  Component Value Date   HGBA1C 12.6 (H) 12/19/2020    Past Medical History:  Diagnosis Date  . Calculus of gallbladder with acute cholecystitis 11/08/2014   Via CT AB 2015   . Diabetes mellitus without complication (HCC)   . Fatty liver disease, nonalcoholic 11/08/2014  . Gout 07/17/2014  . H/O cluster headache   . History of nephrolithiasis 11/08/2014  . 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 Vitamin D deficiency      Allergies  Allergen Reactions  . Levaquin [Levofloxacin]     GI Upset  . Valtrex [Valacyclovir Hcl] Hives   Allergies:  Allergies  Allergen Reactions  . Levaquin [Levofloxacin]     GI Upset  . Valtrex [Valacyclovir Hcl] Hives   Surgical History:  He  has a past surgical history that includes Toe  Surgery; Varicocele excision; and Nasal sinus surgery. Family History:  Hisfamily history includes Diabetes in his father; Heart attack (age of onset: 37) in his father; Hypertension in his father. Social History:   reports that he has never smoked. He has never used smokeless tobacco. He reports that he does not drink  alcohol and does not use drugs.   Current Outpatient Medications on File Prior to Visit  Medication Sig  . allopurinol (ZYLOPRIM) 300 MG tablet TAKE 1 TABLET DAILY TO PREVENT GOUT  . aspirin EC 81 MG tablet Take 81 mg by mouth at bedtime.  Marland Kitchen atenolol (TENORMIN) 50 MG tablet TAKE 1 TABLET DAILY FOR HEART RHYTHM  . azithromycin (ZITHROMAX) 250 MG tablet Take 2 tablets with Food on  Day 1, then 1 tablet Daily with Food for Sinusitis / Bronchitis  . B Complex-C (SUPER B COMPLEX PO) Take 1 tablet by mouth daily.  . benzonatate (TESSALON) 200 MG capsule Take 1 perle 3 x / day to prevent cough  . fenofibrate micronized (LOFIBRA) 134 MG capsule TAKE 1 CAPSULE DAILY TO PREVENT GOUT  . glipiZIDE (GLUCOTROL) 5 MG tablet Take 1/2-1 tablet  3 x /day with Meals for Diabetes  . glucose blood test strip Check blood sugar 1 time daily. [id number]  . metFORMIN (GLUCOPHAGE-XR) 500 MG 24 hr tablet TAKE 2 TABLETS TWICE A DAY WITH MEALS FOR DIABETES  . montelukast (SINGULAIR) 10 MG tablet TAKE 1 TABLET DAILY FOR ALLERGIES  . Multiple Vitamin (MULTIVITAMIN PO) Take by mouth daily.  . Omega-3 Fatty Acids (FISH OIL) 1000 MG CAPS Take 1,000 mg by mouth 2 (two) times daily.   Marland Kitchen OVER THE COUNTER MEDICATION Takes OTC Advil PRN for headache  . promethazine-dextromethorphan (PROMETHAZINE-DM) 6.25-15 MG/5ML syrup Take 1 tsp every 4 hours if needed for cough  . Vitamin D, Cholecalciferol, 1000 UNITS TABS Take 1,000 mg by mouth 2 (two) times daily.    No current facility-administered medications on file prior to visit.    ROS: all negative except above.   Physical Exam:  BP 124/70   Pulse 60   Temp (!) 97.3 F (36.3 C)   Ht 6' 3.5" (1.918 m)   Wt 218 lb (98.9 kg)   SpO2 97%   BMI 26.89 kg/m   General Appearance: Well nourished, well dressed adult male in no apparent distress. Eyes: PERRLA, EOMs, conjunctiva no swelling or erythema Sinuses: No Frontal/maxillary tenderness ENT/Mouth: Ext aud canals clear, TMs  without erythema, bulging. No auricle/tragus/mastoid tenderness. Very limited mouth opening due to pain in mouth/neck. Denies pain at TMJ though unable to palpate internally. No mastoid tenderness or swelling. Limited visibility in mouth - of what can see - No erythema, swelling, or exudate on post pharynx.  Tonsils not swollen or erythematous. Tongue appears normal. Left sublingual gland tenderness. Left submandibular gland tenderness. Exquisite tenderness limits thorough palpation, patient guarding. Hearing normal.  Neck: Supple, thyroid normal.  Respiratory: Respiratory effort normal, BS equal bilaterally without rales, rhonchi, wheezing or stridor.  Cardio: RRR with no MRGs. Brisk peripheral pulses without edema.  Lymphatics: Left anterior cervical chain with swelling, exquisite tenderness limits characterization with patient guarding and inability to tolerate. Subtle swollen appearance.  Musculoskeletal: No cervical spinous tenderness, ROM limited by left anterior neck pain, no muscular tenderness or spasm.  Skin: Warm, dry without rashes, lesions, ecchymosis.  Neuro: Cranial nerves intact. Normal muscle tone, no cerebellar symptoms. Sensation intact.  Psych: Awake and oriented X 3, normal affect, Insight and Judgment appropriate.  Dan Maker, NP 5:50 PM [facility]

## 2021-01-22 ENCOUNTER — Other Ambulatory Visit: Payer: Self-pay

## 2021-01-22 ENCOUNTER — Ambulatory Visit (INDEPENDENT_AMBULATORY_CARE_PROVIDER_SITE_OTHER): Payer: BC Managed Care – PPO | Admitting: Adult Health

## 2021-01-22 ENCOUNTER — Encounter: Payer: Self-pay | Admitting: Adult Health

## 2021-01-22 VITALS — BP 124/70 | HR 60 | Temp 97.3°F | Ht 75.5 in | Wt 218.0 lb

## 2021-01-22 DIAGNOSIS — R6884 Jaw pain: Secondary | ICD-10-CM | POA: Diagnosis not present

## 2021-01-22 DIAGNOSIS — R600 Localized edema: Secondary | ICD-10-CM

## 2021-01-22 DIAGNOSIS — K118 Other diseases of salivary glands: Secondary | ICD-10-CM

## 2021-01-22 MED ORDER — PREDNISONE 20 MG PO TABS: ORAL_TABLET | ORAL | 0 refills | Status: DC

## 2021-01-22 MED ORDER — MOXIFLOXACIN HCL 400 MG PO TABS: 400.0000 mg | ORAL_TABLET | Freq: Every day | ORAL | 0 refills | Status: DC

## 2021-01-22 MED ORDER — TRAMADOL HCL 50 MG PO TABS: ORAL_TABLET | ORAL | 0 refills | Status: DC

## 2021-01-22 NOTE — Patient Instructions (Signed)
Salivary Stone  A salivary stone is a mineral deposit that builds up in the ducts that drain your salivary glands. Most salivary stones are made of calcium. When a stone forms, saliva can back up into the gland and cause painful swelling. Your salivary glands are the glands that produce saliva. You have six major salivary glands. Each gland has a duct that carries saliva into your mouth. Saliva keeps your mouth moist and breaks down the food that you eat. It also helps prevent tooth decay. Two salivary glands are located just in front of your ears (parotid). The ducts for these glands open up inside your cheeks, near your back teeth. You also have two glands under your tongue (sublingual) and two glands under your jaw (submandibular). The ducts for these glands open under your tongue. A stone can form in any salivary gland. The most common place for a salivary stone to develop is in a submandibular salivary gland. What are the causes? Salivary stones may be caused by any condition that reduces the flow of saliva. It is not known why some people form stones. What increases the risk? You are more likely to develop this condition if:  You are male.  You do not drink enough water.  You smoke.  You have any of the following: ? High blood pressure. ? Gout. ? Diabetes. What are the signs or symptoms? The main sign of a salivary gland stone is sudden swelling of a salivary gland when eating. This usually happens under the jaw on one side. Other signs and symptoms may include:  Swelling of the cheek or under the tongue when eating.  Pain in the swollen area.  Trouble chewing or swallowing.  Swelling that goes down after eating. How is this diagnosed? This condition may be diagnosed based on:  Your signs and symptoms.  A physical exam. In many cases, your health care provider will be able to feel the stone in a duct inside your mouth.  Imaging studies, such  as: ? X-rays. ? Ultrasound. ? CT scan. ? MRI. You may need to see an ear, nose, and throat specialist (ENT or otolaryngologist) for diagnosis and treatment. How is this treated? Treatment for this condition depends on the size of the stone.  A small stone that is not causing symptoms may be treated with home care.  For a stone that is large enough to cause symptoms, the treatment options may include: ? Probing and widening of the duct to allow the stone to pass. ? Inserting a thin, flexible scope (endoscope) into the duct to locate and remove the stone. ? Breaking up the stone with sound waves. ? Removing the entire salivary gland. Follow these instructions at home: To relieve discomfort  Follow these instructions every few hours: ? Suck on a lemon candy to stimulate the flow of saliva. ? Put a warm compress over the gland. ? Gently massage the gland. General instructions  Drink enough fluid to keep your urine pale yellow.  Do not use any products that contain nicotine or tobacco, such as cigarettes and e-cigarettes. If you need help quitting, ask your health care provider.  Keep all follow-up visits as told by your health care provider. This is important.   Contact a health care provider if:  You have pain and swelling in your face, jaw, or mouth after eating.  You have persistent swelling in any of these places: ? In front of your ear. ? Under your jaw. ? Inside your mouth.   Get help right away if:  You have pain and swelling in your face, jaw, or mouth, and this is getting worse.  Your pain and swelling make it hard to swallow or breathe. Summary  A salivary stone is a mineral deposit that builds up in the ducts that drain your salivary glands.  When a stone forms, saliva can back up into the gland and cause painful swelling.  Salivary stones may be caused by any condition that reduces the flow of saliva.  Treatment for this condition depends on the size of the  stone. This information is not intended to replace advice given to you by your health care provider. Make sure you discuss any questions you have with your health care provider. Document Revised: 09/27/2017 Document Reviewed: 09/27/2017 Elsevier Patient Education  2021 Elsevier Inc.  

## 2021-01-29 ENCOUNTER — Ambulatory Visit (INDEPENDENT_AMBULATORY_CARE_PROVIDER_SITE_OTHER): Payer: BC Managed Care – PPO | Admitting: Adult Health

## 2021-01-29 ENCOUNTER — Other Ambulatory Visit: Payer: Self-pay

## 2021-01-29 ENCOUNTER — Encounter: Payer: Self-pay | Admitting: Adult Health

## 2021-01-29 VITALS — BP 118/66 | HR 65 | Temp 98.2°F | Wt 209.0 lb

## 2021-01-29 DIAGNOSIS — R609 Edema, unspecified: Secondary | ICD-10-CM

## 2021-01-29 NOTE — Progress Notes (Signed)
Assessment and Plan:  Diagnoses and all orders for this visit:  Salivary gland swelling Submandibular gland tenderness Jaw pain Exquisite pain and limited ROM in jaw limits exam, however clear salivary gland/submandigular swelling with tenderness, no fever/chills, limited benefit with moxifloxacin/steroid, sour candies ? Stone vs mass, not improving with conservative interventions Will proceed with CT - non-contrast to r/o stone, also limited supply of contrast locally.  Will go to the ER if worsening headache, difficulty swallowing, changes vision/speech, imbalance, weakness. -     CT SOFT TISSUE NECK WO CONTRAST; Future  Further disposition pending results of labs. Discussed med's effects and SE's.   Over 15 minutes of exam, counseling, chart review, and critical decision making was performed.   Future Appointments  Date Time Provider Department Center  04/09/2021  2:30 PM Judd Gaudier, NP [facility] None  07/14/2021  3:30 PM Lucky Cowboy, MD [facility] None  12/23/2021  2:00 PM Lucky Cowboy, MD [facility] None    ------------------------------------------------------------------------------------------------------------------   HPI 65 y.o.male, poorly controlled T2DM presents for follow up of jaw/mouth pain suspect for salivary stone.   He reports 4 weeks ago after working in yard had sinus sx, Dr. Oneta Rack sent in Zpak which did improve HA. Unfortunately he then developed pain in left teeth, jaw, painful to move tongue and limited ability to open mouth, tingling intermittently in lower jaw, pressure sensation in left ear without tinnitis, tenderness in left neck nodules, saw dentist without abnormal dental findings, but presented on 01/23/2021 with progressive above sx, but without fever/chills.   Denied dizziness, vision changes, stiff neck, rash, GI sx, myalgias. Limited exam due to pain with palpation and opening mouth, however concern for possible salivary stone,  couldn't r/o injection, was placed on steroid taper and Moxifloxacin due to risks and inflammation, tramadol for severe pain not improved with tylenol/ibuprofen, returns for recheck. He reports slow mild improvement with abx and steroid taper, completed yesterday. He is able to manage pain with tylenol 1000 mg during day, tramadol at night. Reports pain did improve from 9/10 > 7/10. Continued limited ability to eat, submandibular pain limits eating, doing soups/liquids. Did try sour candies, minimal effect.   Reports much improved fasting, has been ranging 89-127.  Lab Results  Component Value Date   HGBA1C 12.6 (H) 12/19/2020    Past Medical History:  Diagnosis Date  . Calculus of gallbladder with acute cholecystitis 11/08/2014   Via CT AB 2015   . Diabetes mellitus without complication (HCC)   . Fatty liver disease, nonalcoholic 11/08/2014  . Gout 07/17/2014  . H/O cluster headache   . History of nephrolithiasis 11/08/2014  . 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 Vitamin D deficiency      Allergies  Allergen Reactions  . Levaquin [Levofloxacin]     GI Upset  . Valtrex [Valacyclovir Hcl] Hives   Allergies:  Allergies  Allergen Reactions  . Levaquin [Levofloxacin]     GI Upset  . Valtrex [Valacyclovir Hcl] Hives   Surgical History:  He  has a past surgical history that includes Toe Surgery; Varicocele excision; and Nasal sinus surgery. Family History:  Hisfamily history includes Diabetes in his father; Heart attack (age of onset: 38) in his father; Hypertension in his father. Social History:   reports that he has never smoked. He has never used smokeless tobacco. He reports that he does not drink alcohol and does not use drugs.   Current Outpatient Medications on File Prior to Visit  Medication Sig  . allopurinol (  ZYLOPRIM) 300 MG tablet TAKE 1 TABLET DAILY TO PREVENT GOUT  . aspirin EC 81 MG tablet Take 81 mg by mouth at bedtime.  Marland Kitchen atenolol (TENORMIN) 50 MG tablet TAKE 1  TABLET DAILY FOR HEART RHYTHM  . B Complex-C (SUPER B COMPLEX PO) Take 1 tablet by mouth daily.  . fenofibrate micronized (LOFIBRA) 134 MG capsule TAKE 1 CAPSULE DAILY TO PREVENT GOUT  . glipiZIDE (GLUCOTROL) 5 MG tablet Take 1/2-1 tablet  3 x /day with Meals for Diabetes  . glucose blood test strip Check blood sugar 1 time daily. [id number]  . metFORMIN (GLUCOPHAGE-XR) 500 MG 24 hr tablet TAKE 2 TABLETS TWICE A DAY WITH MEALS FOR DIABETES  . montelukast (SINGULAIR) 10 MG tablet TAKE 1 TABLET DAILY FOR ALLERGIES  . Multiple Vitamin (MULTIVITAMIN PO) Take by mouth daily.  . Omega-3 Fatty Acids (FISH OIL) 1000 MG CAPS Take 1,000 mg by mouth 2 (two) times daily.   Marland Kitchen OVER THE COUNTER MEDICATION Takes OTC Advil PRN for headache  . traMADol (ULTRAM) 50 MG tablet 1/2-1 tablet q6 hours as needed for severe pain.  . Vitamin D, Cholecalciferol, 1000 UNITS TABS Take 1,000 mg by mouth 2 (two) times daily.   Marland Kitchen moxifloxacin (AVELOX) 400 MG tablet Take 1 tablet (400 mg total) by mouth daily at 8 pm for 7 days.  . predniSONE (DELTASONE) 20 MG tablet 2 tablets daily for 3 days, 1 tablet daily for 4 days.   No current facility-administered medications on file prior to visit.    ROS: all negative except above.   Physical Exam:  BP 118/66   Pulse 65   Temp 98.2 F (36.8 C)   Wt 209 lb (94.8 kg)   SpO2 97%   BMI 25.78 kg/m   General Appearance: Well nourished, well dressed adult male in no apparent distress. Eyes: PERRLA, EOMs, conjunctiva no swelling or erythema Sinuses: No Frontal/maxillary tenderness ENT/Mouth: Ext aud canals clear, TMs without erythema, bulging. No auricle/tragus/mastoid tenderness. Very limited mouth opening due to pain in mouth/neck. Denies pain at TMJ though unable to palpate internally. No mastoid tenderness or swelling. Limited visibility in mouth - of what can see - No erythema, swelling, or exudate on post pharynx.  Tonsils not swollen or erythematous. Tongue appears normal,  no ulcers or erythema. Left sublingual gland tenderness without palpable abnormality. Left submandibular gland tenderness and enlargement. Normal parotic, buccal non-tender. Hearing normal. Neck: Supple, thyroid normal.  Respiratory: Respiratory effort normal, BS equal bilaterally without rales, rhonchi, wheezing or stridor.  Cardio: RRR with no MRGs. Brisk peripheral pulses without edema.  Lymphatics: No palpable cervical lymphadenopathy.   Musculoskeletal: No cervical spinous tenderness, ROM intact, no muscular tenderness or spasm. No palpable bony abnormality or tenderness of jaw.  Skin: Warm, dry without rashes, lesions, ecchymosis.  Neuro: Cranial nerves intact. Normal muscle tone, no cerebellar symptoms. Sensation intact.  Psych: Awake and oriented X 3, normal affect, Insight and Judgment appropriate.     Dan Maker, NP 2:41 PM [facility]

## 2021-01-30 ENCOUNTER — Other Ambulatory Visit: Payer: Self-pay | Admitting: Adult Health

## 2021-01-30 ENCOUNTER — Ambulatory Visit
Admission: RE | Admit: 2021-01-30 | Discharge: 2021-01-30 | Disposition: A | Discharge status: Home / Self Care | Payer: BC Managed Care – PPO | Source: Ambulatory Visit | Attending: Adult Health | Admitting: Adult Health

## 2021-01-30 ENCOUNTER — Ambulatory Visit: Payer: BC Managed Care – PPO | Admitting: Adult Health

## 2021-01-30 DIAGNOSIS — K1120 Sialoadenitis, unspecified: Secondary | ICD-10-CM | POA: Insufficient documentation

## 2021-01-30 DIAGNOSIS — K118 Other diseases of salivary glands: Secondary | ICD-10-CM | POA: Diagnosis not present

## 2021-01-30 DIAGNOSIS — R590 Localized enlarged lymph nodes: Secondary | ICD-10-CM | POA: Diagnosis not present

## 2021-01-30 DIAGNOSIS — K1121 Acute sialoadenitis: Secondary | ICD-10-CM | POA: Diagnosis not present

## 2021-01-30 DIAGNOSIS — R609 Edema, unspecified: Secondary | ICD-10-CM

## 2021-01-30 MED ORDER — PREDNISONE 20 MG PO TABS
ORAL_TABLET | ORAL | 0 refills | Status: DC
Start: 2021-01-30 — End: 2021-05-06

## 2021-01-30 MED ORDER — MOXIFLOXACIN HCL 400 MG PO TABS: 400.0000 mg | ORAL_TABLET | Freq: Every day | ORAL | 0 refills | Status: AC

## 2021-01-30 MED ORDER — TRAMADOL HCL 50 MG PO TABS: ORAL_TABLET | ORAL | 0 refills | Status: DC

## 2021-03-17 ENCOUNTER — Other Ambulatory Visit: Payer: Self-pay | Admitting: Adult Health

## 2021-03-24 ENCOUNTER — Other Ambulatory Visit: Payer: Self-pay | Admitting: Adult Health

## 2021-03-24 DIAGNOSIS — E782 Mixed hyperlipidemia: Secondary | ICD-10-CM

## 2021-04-08 ENCOUNTER — Other Ambulatory Visit: Payer: Self-pay | Admitting: Internal Medicine

## 2021-04-08 DIAGNOSIS — E1122 Type 2 diabetes mellitus with diabetic chronic kidney disease: Secondary | ICD-10-CM

## 2021-04-08 DIAGNOSIS — N182 Chronic kidney disease, stage 2 (mild): Secondary | ICD-10-CM

## 2021-04-08 MED ORDER — GLIPIZIDE 5 MG PO TABS: ORAL_TABLET | ORAL | 3 refills | Status: DC

## 2021-04-09 ENCOUNTER — Ambulatory Visit: Payer: BC Managed Care – PPO | Admitting: Adult Health

## 2021-04-15 ENCOUNTER — Ambulatory Visit: Payer: BC Managed Care – PPO | Admitting: Adult Health

## 2021-05-02 NOTE — Progress Notes (Signed)
MEDICARE ANNUAL WELLNESS VISIT AND FOLLOW UP Assessment:   Sean [name] was seen today for follow-up and medicare wellness.  Diagnoses and all orders for this visit:  Encounter for Medicare annual wellness exam Due annually  Health maintenance reviewed ? Colonoscopy status - GI referral was placed - sent message to referral coordinator to follow up on this status  Abdominal aortic atherosclerosis (HCC) by Abd CT scan 2018  Control blood pressure, cholesterol, glucose, increase exercise.   Essential hypertension Continue medication Monitor blood pressure at home; call if consistently over 130/80 Continue DASH diet.   Reminder to go to the ER if any CP, SOB, nausea, dizziness, severe HA, changes vision/speech, left arm numbness and tingling and jaw pain. -     CBC with Differential/Platelet -     COMPLETE METABOLIC PANEL WITH GFR -     Magnesium  Poorly controlled diabetes mellitus (HCC) Anticipate much improved A1C based on fasting Education: Reviewed 'ABCs' of diabetes management (respective goals in parentheses):  A1C (<7), blood pressure (<130/80), and cholesterol (LDL <70) Eye Exam yearly and Dental Exam every 6 months. Dietary recommendations Physical Activity recommendations -     Hemoglobin A1c  Type 2 diabetes mellitus with stage 2 chronic kidney disease, without long-term current use of insulin (HCC) Continue medications: metformin, glipizide  Discussed GLP-1ra if remains significantly above goal  Continue diet and exercise.  Perform daily foot/skin check, notify office of any concerning changes.  Check A1C -     COMPLETE METABOLIC PANEL WITH GFR -     Hemoglobin A1c -     glucose blood test strip; Check blood sugar 1 time daily. [id number]  Hyperlipidemia associated with type 2 diabetes mellitus (HCC) Continue medications; LDL at goal <70 Continue low cholesterol diet and exercise.  -     Lipid panel -     TSH  CKD stage 2 due to type 2 diabetes mellitus  (HCC) Increase fluids, avoid NSAIDS, monitor sugars, will monitor Functions have been stable, low microalbumin Monitor and add low dose ACEi/ARB if needed, declines at this time -     COMPLETE METABOLIC PANEL WITH GFR  Fatty liver disease, nonalcoholic Weight loss with low processed carbohydrate diet recommended Limit tylenol/alcohol Monitor LFT trend and follow up US if trending up -     COMPLETE METABOLIC PANEL WITH GFR  Idiopathic gout, unspecified chronicity, unspecified site Last uric acid very low, no flares in years Discussed possible risk of increased demential with very low uric acid Will try reduced dose allopurinol 150 mg and recheck  Continue lifestyle efforts  Vitamin D deficiency At goal; continue supplement  Overweight (BMI 25.0-29.9) Long discussion about weight loss, diet, and exercise Recommended diet heavy in fruits and veggies and low in animal meats, cheeses, and dairy products, appropriate calorie intake Discussed appropriate weight for height Follow up at next visit  Calculus of gallbladder Denies sx; avoid excess fatty foods, monitor  History of nephrolithiasis Push water intake   Over 30 minutes of exam, counseling, chart review, and critical decision making was performed  Future Appointments  Date Time Provider Department Center  07/14/2021  3:30 PM Lucky CowboyMcKeown, Cali, MD [facility] None  12/23/2021  2:00 PM Lucky CowboyMcKeown, Oluwaferanmi, MD [facility] None  05/06/2022 11:00 AM Judd Gaudierorbett, Lashica Hannay, NP [facility] None     Plan:   During the course of the visit the patient was educated and counseled about appropriate screening and preventive services including:   Pneumococcal vaccine  Influenza vaccine Prevnar 13 Td vaccine Screening  electrocardiogram Colorectal cancer screening Diabetes screening Glaucoma screening Nutrition counseling    Subjective:  Sean [name] is a 65 y.o. male who presents for Medicare Annual Wellness Visit and 3 month follow up  for HTN, hyperlipidemia, T2DM with CKD, and vitamin D Def.   Recently in June 2022 he was treated for salivary gland adenitis that has resolved without residual problems.   BMI is Body mass index is 27.01 kg/m., he has been working on diet, not intentionally exercising but very active in yard, cutting down trees, etc. 6-7 hours/week.  Wt Readings from Last 3 Encounters:  05/06/21 219 lb (99.3 kg)  01/29/21 209 lb (94.8 kg)  01/22/21 218 lb (98.9 kg)   His blood pressure has been controlled at home, today their BP is BP: 118/72 He does workout. He denies chest pain, shortness of breath, dizziness.   He has aortic atherosclerosis per CT 2018.   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   He is on cholesterol medication (fenofibrate) and denies myalgias. His cholesterol is at goal. The cholesterol last visit was:   Lab Results  Component Value Date   CHOL 115 12/19/2020   HDL 27 (L) 12/19/2020   LDLCALC 62 12/19/2020   TRIG 181 (H) 12/19/2020   CHOLHDL 4.3 12/19/2020   He has been working on diet and exercise for T2DM with CKD II, on metformin 2000 mg daily and denies foot ulcerations, hyperglycemia, hypoglycemia , increased appetite, nausea, paresthesia of the feet, polydipsia, polyuria, and visual disturbances.   He has been working on diet (has cut out bread, had replaced sweets) and exercise for T2DM, has been on metformin 2000 mg daily, also glipizide 5 mg TID with meals. He has been checking fasting (90s-136).  Last A1C in the office was:  Lab Results  Component Value Date   HGBA1C 12.6 (H) 12/19/2020   Last GFR Lab Results  Component Value Date   GFRNONAA 73 12/19/2020   Patient is on Vitamin D supplement.   Lab Results  Component Value Date   VD25OH 97 12/19/2020     Patient is on allopurinol for gout and does report a recent flare.  Lab Results  Component Value Date   LABURIC 2.6 (L) 12/19/2020     Medication Review:  Current Outpatient Medications  (Endocrine & Metabolic):    glipiZIDE (GLUCOTROL) 5 MG tablet, Take 1/2-1 tablet  3 x /day with Meals for Diabetes / Patient knows to take by mouth   metFORMIN (GLUCOPHAGE-XR) 500 MG 24 hr tablet, TAKE 2 TABLETS TWICE A DAY WITH MEALS FOR DIABETES  Current Outpatient Medications (Cardiovascular):    atenolol (TENORMIN) 50 MG tablet, TAKE 1 TABLET DAILY FOR HEART RHYTHM   fenofibrate micronized (LOFIBRA) 134 MG capsule, TAKE 1 CAPSULE DAILY TO PREVENT GOUT  Current Outpatient Medications (Respiratory):    montelukast (SINGULAIR) 10 MG tablet, TAKE 1 TABLET DAILY FOR ALLERGIES  Current Outpatient Medications (Analgesics):    allopurinol (ZYLOPRIM) 300 MG tablet, TAKE 1 TABLET DAILY TO PREVENT GOUT   aspirin EC 81 MG tablet, Take 81 mg by mouth at bedtime.   Current Outpatient Medications (Other):    B Complex-C (SUPER B COMPLEX PO), Take 1 tablet by mouth daily.   Multiple Vitamin (MULTIVITAMIN PO), Take by mouth daily.   Omega-3 Fatty Acids (FISH OIL) 1000 MG CAPS, Take 1,000 mg by mouth 2 (two) times daily.    OVER THE COUNTER MEDICATION, Takes OTC Advil PRN for headache   Vitamin D,  Cholecalciferol, 1000 UNITS TABS, Take 1,000 mg by mouth 2 (two) times daily.    glucose blood test strip, Check blood sugar 1 time daily. [id number]  Allergies: Allergies  Allergen Reactions   Levaquin [Levofloxacin]     GI Upset   Valtrex [Valacyclovir Hcl] Hives    Current Problems (verified) has Vitamin D deficiency; Type 2 diabetes mellitus with stage 2 chronic kidney disease, without long-term current use of insulin (HCC); H/O cluster headache; Hyperlipidemia associated with type 2 diabetes mellitus (HCC); Idiopathic gout; Essential hypertension; History of nephrolithiasis; Fatty liver disease, nonalcoholic; Calculus of gallbladder; CKD stage 2 due to type 2 diabetes mellitus (HCC); Overweight (BMI 25.0-29.9); Abdominal aortic atherosclerosis (HCC) by Abd CT scan 2018 ; and Poorly controlled  diabetes mellitus (HCC) on their problem list.  Screening Tests Immunization History  Administered Date(s) Administered   Influenza Inj Mdck Quad With Preservative 05/20/2017, 06/08/2018   Influenza Split 05/15/2013, 07/17/2014, 06/18/2015   Influenza, High Dose Seasonal PF 10/02/2019, 07/16/2020   Influenza, Seasonal, Injecte, Preservative Fre 09/08/2016   PFIZER(Purple Top)SARS-COV-2 Vaccination 11/29/2019, 12/27/2019   PPD Test 07/17/2014, 09/30/2015, 02/02/2017, 06/08/2018   Pneumococcal Conjugate-13 10/02/2019   Pneumococcal Polysaccharide-23 02/07/2016   Tdap 09/30/2015    Preventative care: Last colonoscopy: reports had WNL at age 22, can't recall where to get records, but was referred to Dr. Kinnie Scales by Dr. Oneta Rack, in 12/2020, never heard to schedule. Will send note to referral coordinator to follow up.   Prior vaccinations: TD or Tdap: 2017  Influenza: 07/2020 - get in Oct Pneumococcal: 2017 Prevnar13: 2021 Shingles/Zostavax: declines Covid 19: 2/2, pfizer   Names of Other Physician/Practitioners you currently use: 1. [facility] here for primary care 2. Dr. ? , eye doctor, last visit remote, states will schedule this month 3. Dr. Valma Cava, dentist, last visit 2022, goes q102m, has in 2 weeks  Patient Care Team: Lucky Cowboy, MD as PCP - General (Internal Medicine) Kathleene Hazel, MD as PCP - Cardiology (Cardiology) Ralene Cork, DO as Consulting Physician (Sports Medicine) Fletcher Anon, MD as Consulting Physician (Pediatrics)  Surgical: He  has a past surgical history that includes Toe Surgery; Varicocele excision; and Nasal sinus surgery. Family His family history includes Diabetes in his father; Heart attack (age of onset: 51) in his father; Hypertension in his father. Social history  He reports that he has never smoked. He has never used smokeless tobacco. He reports that he does not drink alcohol and does  not use drugs.  MEDICARE WELLNESS OBJECTIVES: Physical activity: Current Exercise Habits: The patient has a physically strenuous job, but has no regular exercise apart from work., Exercise limited by: None identified Cardiac risk factors: Cardiac Risk Factors include: advanced age (>53men, >43 women);dyslipidemia;hypertension;male gender;diabetes mellitus Depression/mood screen:   Depression screen [facility] 2/9 05/06/2021  Decreased Interest 0  Down, Depressed, Hopeless 0  PHQ - 2 Score 0    ADLs:  In your present state of health, do you have any difficulty performing the following activities: 05/06/2021 12/19/2020  Hearing? N N  Vision? N N  Difficulty concentrating or making decisions? N N  Walking or climbing stairs? N N  Dressing or bathing? N N  Doing errands, shopping? N N  Some recent data might be hidden     Cognitive Testing  Alert? Yes  Normal Appearance?Yes  Oriented to person? Yes  Place? Yes   Time? Yes  Recall of three objects?  Yes  Can perform simple calculations? Yes  Displays appropriate judgment?Yes  Can read the correct time from a watch face?Yes  EOL planning: Does Patient Have a Medical Advance Directive?: No Would patient like information on creating a medical advance directive?: No - Patient declined   Objective:   Today's Vitals   05/06/21 1032  BP: 118/72  Pulse: (!) 58  Temp: (!) 97.3 F (36.3 C)  SpO2: 96%  Weight: 219 lb (99.3 kg)  Height: 6' 3.5" (1.918 m)   Body mass index is 27.01 kg/m.  General appearance: alert, no distress, WD/WN, male HEENT: normocephalic, sclerae anicteric, TMs pearly, nares patent, no discharge or erythema, pharynx normal Oral cavity: MMM, no lesions Neck: supple, no lymphadenopathy, no thyromegaly, no masses Heart: RRR, normal S1, S2, no murmurs Lungs: CTA bilaterally, no wheezes, rhonchi, or rales Abdomen: +bs, soft, non tender, non distended, no masses, no hepatomegaly, no splenomegaly Musculoskeletal: nontender,  no swelling, no obvious deformity Extremities: no edema, no cyanosis, no clubbing Pulses: 2+ symmetric, upper and lower extremities, normal cap refill Neurological: alert, oriented x 3, CN2-12 intact, strength normal upper extremities and lower extremities, sensation normal throughout, DTRs 2+ throughout, no cerebellar signs, gait normal Psychiatric: normal affect, behavior normal, pleasant   Medicare Attestation I have personally reviewed: The patient's medical and social history Their use of alcohol, tobacco or illicit drugs Their current medications and supplements The patient's functional ability including ADLs,fall risks, home safety risks, cognitive, and hearing and visual impairment Diet and physical activities Evidence for depression or mood disorders  The patient's weight, height, BMI, and visual acuity have been recorded in the chart.  I have made referrals, counseling, and provided education to the patient based on review of the above and I have provided the patient with a written personalized care plan for preventive services.     Dan Maker, NP   05/06/2021

## 2021-05-06 ENCOUNTER — Encounter: Payer: Self-pay | Admitting: Adult Health

## 2021-05-06 ENCOUNTER — Other Ambulatory Visit: Payer: Self-pay

## 2021-05-06 ENCOUNTER — Ambulatory Visit (INDEPENDENT_AMBULATORY_CARE_PROVIDER_SITE_OTHER): Payer: BC Managed Care – PPO | Admitting: Adult Health

## 2021-05-06 VITALS — BP 118/72 | HR 58 | Temp 97.3°F | Ht 75.5 in | Wt 219.0 lb

## 2021-05-06 DIAGNOSIS — R6889 Other general symptoms and signs: Secondary | ICD-10-CM | POA: Diagnosis not present

## 2021-05-06 DIAGNOSIS — I10 Essential (primary) hypertension: Secondary | ICD-10-CM

## 2021-05-06 DIAGNOSIS — E1165 Type 2 diabetes mellitus with hyperglycemia: Secondary | ICD-10-CM | POA: Diagnosis not present

## 2021-05-06 DIAGNOSIS — Z0001 Encounter for general adult medical examination with abnormal findings: Secondary | ICD-10-CM

## 2021-05-06 DIAGNOSIS — E663 Overweight: Secondary | ICD-10-CM

## 2021-05-06 DIAGNOSIS — K760 Fatty (change of) liver, not elsewhere classified: Secondary | ICD-10-CM

## 2021-05-06 DIAGNOSIS — I700 Atherosclerosis of aorta: Secondary | ICD-10-CM

## 2021-05-06 DIAGNOSIS — N182 Chronic kidney disease, stage 2 (mild): Secondary | ICD-10-CM

## 2021-05-06 DIAGNOSIS — E1122 Type 2 diabetes mellitus with diabetic chronic kidney disease: Secondary | ICD-10-CM

## 2021-05-06 DIAGNOSIS — E785 Hyperlipidemia, unspecified: Secondary | ICD-10-CM

## 2021-05-06 DIAGNOSIS — Z87442 Personal history of urinary calculi: Secondary | ICD-10-CM

## 2021-05-06 DIAGNOSIS — E559 Vitamin D deficiency, unspecified: Secondary | ICD-10-CM

## 2021-05-06 DIAGNOSIS — E1169 Type 2 diabetes mellitus with other specified complication: Secondary | ICD-10-CM

## 2021-05-06 DIAGNOSIS — Z0000 Encounter for general adult medical examination without abnormal findings: Secondary | ICD-10-CM

## 2021-05-06 DIAGNOSIS — M1000 Idiopathic gout, unspecified site: Secondary | ICD-10-CM

## 2021-05-06 MED ORDER — GLUCOSE BLOOD VI STRP: ORAL_STRIP | 3 refills | Status: DC

## 2021-05-06 MED ORDER — ALLOPURINOL 300 MG PO TABS: ORAL_TABLET | ORAL | 3 refills | Status: DC

## 2021-05-07 LAB — CBC WITH DIFFERENTIAL/PLATELET
Absolute Monocytes: 502 cells/uL (ref 200–950)
Basophils Absolute: 33 cells/uL (ref 0–200)
Basophils Relative: 0.5 %
Eosinophils Absolute: 211 cells/uL (ref 15–500)
Eosinophils Relative: 3.2 %
HCT: 44.4 % (ref 38.5–50.0)
Hemoglobin: 14.6 g/dL (ref 13.2–17.1)
Lymphs Abs: 2079 cells/uL (ref 850–3900)
MCH: 29.1 pg (ref 27.0–33.0)
MCHC: 32.9 g/dL (ref 32.0–36.0)
MCV: 88.6 fL (ref 80.0–100.0)
MPV: 12.1 fL (ref 7.5–12.5)
Monocytes Relative: 7.6 %
Neutro Abs: 3775 cells/uL (ref 1500–7800)
Neutrophils Relative %: 57.2 %
Platelets: 285 10*3/uL (ref 140–400)
RBC: 5.01 10*6/uL (ref 4.20–5.80)
RDW: 13.3 % (ref 11.0–15.0)
Total Lymphocyte: 31.5 %
WBC: 6.6 10*3/uL (ref 3.8–10.8)

## 2021-05-07 LAB — COMPLETE METABOLIC PANEL WITH GFR
AG Ratio: 1.7 (calc) (ref 1.0–2.5)
ALT: 14 U/L (ref 9–46)
AST: 16 U/L (ref 10–35)
Albumin: 4.8 g/dL (ref 3.6–5.1)
Alkaline phosphatase (APISO): 57 U/L (ref 35–144)
BUN/Creatinine Ratio: 24 (calc) — ABNORMAL HIGH (ref 6–22)
BUN: 28 mg/dL — ABNORMAL HIGH (ref 7–25)
CO2: 22 mmol/L (ref 20–32)
Calcium: 10.7 mg/dL — ABNORMAL HIGH (ref 8.6–10.3)
Chloride: 107 mmol/L (ref 98–110)
Creat: 1.16 mg/dL (ref 0.70–1.35)
Globulin: 2.8 g/dL (calc) (ref 1.9–3.7)
Glucose, Bld: 84 mg/dL (ref 65–99)
Potassium: 4.9 mmol/L (ref 3.5–5.3)
Sodium: 138 mmol/L (ref 135–146)
Total Bilirubin: 0.4 mg/dL (ref 0.2–1.2)
Total Protein: 7.6 g/dL (ref 6.1–8.1)
eGFR: 69 mL/min/{1.73_m2} (ref >=60)

## 2021-05-07 LAB — LIPID PANEL
Cholesterol: 109 mg/dL (ref <200)
HDL: 35 mg/dL — ABNORMAL LOW (ref >=40)
LDL Cholesterol (Calc): 52 mg/dL (calc)
Non-HDL Cholesterol (Calc): 74 mg/dL (calc) (ref <130)
Total CHOL/HDL Ratio: 3.1 (calc) (ref <5.0)
Triglycerides: 132 mg/dL (ref <150)

## 2021-05-07 LAB — HEMOGLOBIN A1C
Hgb A1c MFr Bld: 6.3 % of total Hgb — ABNORMAL HIGH (ref <5.7)
Mean Plasma Glucose: 134 mg/dL
eAG (mmol/L): 7.4 mmol/L

## 2021-05-07 LAB — MAGNESIUM: Magnesium: 1.8 mg/dL (ref 1.5–2.5)

## 2021-05-07 LAB — TSH: TSH: 1.97 mIU/L (ref 0.40–4.50)

## 2021-05-16 ENCOUNTER — Telehealth: Payer: Self-pay | Admitting: Internal Medicine

## 2021-05-16 NOTE — Telephone Encounter (Signed)
Patient messaged referring provider that he had not been called to schedule screening colonoscopy w/ Sean [name]. I returned call to patient regarding colonoscopy scheduling.  Advised patient Sean [name] w/ Sean [name] had made several calls to the number provided by patient, in attempt  to schedule colonoscopy in April. Patient advised this office of best contact number, chart updated. Gave patient Sean [name]  & associates office number to call and schedule. Patient agreeable to call and appreciates information.

## 2021-07-14 ENCOUNTER — Ambulatory Visit: Payer: BC Managed Care – PPO | Admitting: Internal Medicine

## 2021-07-21 ENCOUNTER — Other Ambulatory Visit: Payer: Self-pay | Admitting: Internal Medicine

## 2021-07-21 DIAGNOSIS — E1122 Type 2 diabetes mellitus with diabetic chronic kidney disease: Secondary | ICD-10-CM

## 2021-07-21 DIAGNOSIS — N182 Chronic kidney disease, stage 2 (mild): Secondary | ICD-10-CM

## 2021-07-21 DIAGNOSIS — E1121 Type 2 diabetes mellitus with diabetic nephropathy: Secondary | ICD-10-CM

## 2021-08-07 ENCOUNTER — Ambulatory Visit: Payer: BC Managed Care – PPO | Admitting: Internal Medicine

## 2021-09-02 NOTE — Progress Notes (Signed)
Future Appointments  Date Time Provider Department  09/03/2021 10:30 AM Unk Pinto, MD [facility]  12/23/2021                 CPE   2:00 PM Unk Pinto, MD [facility]  05/06/2022                   Wellness 11:00 AM Liane Comber, NP [facility]    History of Present Illness:       This very nice 66 y.o. MWM presents for 3 month follow up with HTN, HLD, T2_NIDDM and Vitamin D Deficiency. Patient's Gout is controlled on his meds. Abdominal CT scan in 2018 showed Aortic Atherosclerosis.        Patient is treated for HTN  (2010) & BP has been controlled at home. Todays BP is at goal -  128/80.  Patient  has hx/o pSVT  followed by Dr Julianne Handler .    Patient has had no complaints of any cardiac type chest pain, palpitations, dyspnea / orthopnea / PND, dizziness, claudication, or dependent edema.       Hyperlipidemia is controlled with diet & fenofibrates. Patient denies myalgias or other med SEs.   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  Last A1c was not at goal :  Lab Results  Component Value Date   HGBA1C 6.3 (H) 05/06/2021                                                            Further, the patient also has history of Vitamin D Deficiency ("34" /2012) and supplements vitamin D without any suspected side-effects. Last vitamin D was at goal :   Lab Results  Component Value Date   VD25OH 97 12/19/2020     Current Outpatient Medications on File Prior to Visit  Medication Sig   allopurinol (ZYLOPRIM) 300 MG tablet TAKE 1/2 TABLET DAILY TO PREVENT GOUT   aspirin EC 81 MG tablet Take at bedtime.   atenolol (TENORMIN) 50 MG tablet TAKE 1 TABLET DAILY FOR HEART  RHYTHM   UPER B COMPLEX PO) Take 1 tablet daily.   fenofibrate micronized  134 MG capsule TAKE 1 CAPSULE DAILY TO PREVENT GOUT   glipiZIDE 5 MG tablet Take 1/2-1 tablet  3 x /day with Meals for Diabetes / Patient knows to take by mouth   metFORMIN -XR 500 MG 24 hr tablet TAKE 2 TABLETS TWICE A DAY WITH MEALS FOR DIABETES   montelukast (SINGULAIR) 10 MG tablet TAKE 1 TABLET DAILY FOR ALLERGIES   Multiple Vitamin (MULTIVITAMIN PO) Take by daily.   Omega-3  FISH OIL 1000 MG CAPS Take 1,000 mg 2 (two) times daily.    OTC Advil  Takes PRN for headache   Vitamin D, Cholecalciferol, 1000 UNITS TABS Take 1,000 mg by mouth 2 (two) times daily.     Allergies  Allergen Reactions   Levaquin [Levofloxacin]     GI Upset  Valtrex [Valacyclovir Hcl] Hives     PMHx:   Past Medical History:  Diagnosis Date   Calculus of gallbladder with acute cholecystitis 11/08/2014   Via CT AB 2015    Diabetes mellitus without complication ([place])    Fatty liver disease, nonalcoholic [id number]   Gout 07/17/2014   H/O cluster headache    History of nephrolithiasis 11/08/2014   T2_NIDDM w/CKD2 (GFR 63 ml/min)    Vitamin D deficiency      Immunization History  Administered Date(s) Administered   Influenza Inj Mdck Quad  05/20/2017, 06/08/2018   Influenza Split 05/15/2013, 07/17/2014, 06/18/2015   Influenza, High Dose  10/02/2019, 07/16/2020   Influenza, Seasonal 09/08/2016   PFIZER SARS-COV-2 Vacc 11/29/2019, 12/27/2019   PPD Test 02/02/2017, 06/08/2018   Pneumococcal -13 10/02/2019   Pneumococcal -23 02/07/2016   Tdap 09/30/2015     Past Surgical History:  Procedure Laterality Date   NASAL SINUS SURGERY     TOE SURGERY     VARICOCELE EXCISION      FHx:    Reviewed / unchanged  SHx:    Reviewed / unchanged   Systems Review:  Constitutional: Denies fever, chills, wt changes, headaches, insomnia, fatigue, night sweats, change in appetite. Eyes: Denies redness, blurred vision, diplopia,  discharge, itchy, watery eyes.  ENT: Denies discharge, congestion, post nasal drip, epistaxis, sore throat, earache, hearing loss, dental pain, tinnitus, vertigo, sinus pain, snoring.  CV: Denies chest pain, palpitations, irregular heartbeat, syncope, dyspnea, diaphoresis, orthopnea, PND, claudication or edema. Respiratory: denies cough, dyspnea, DOE, pleurisy, hoarseness, laryngitis, wheezing.  Gastrointestinal: Denies dysphagia, odynophagia, heartburn, reflux, water brash, abdominal pain or cramps, nausea, vomiting, bloating, diarrhea, constipation, hematemesis, melena, hematochezia  or hemorrhoids. Genitourinary: Denies dysuria, frequency, urgency, nocturia, hesitancy, discharge, hematuria or flank pain. Musculoskeletal: Denies arthralgias, myalgias, stiffness, jt. swelling, pain, limping or strain/sprain.  Skin: Denies pruritus, rash, hives, warts, acne, eczema or change in skin lesion(s). Neuro: No weakness, tremor, incoordination, spasms, paresthesia or pain. Psychiatric: Denies confusion, memory loss or sensory loss. Endo: Denies change in weight, skin or hair change.  Heme/Lymph: No excessive bleeding, bruising or enlarged lymph nodes.  Physical Exam  BP 128/80    Pulse 71    Temp 97.9 F (36.6 C)    Resp 16    Ht 6' 3.5" (1.918 m)    Wt 222 lb 12.8 oz (101.1 kg)    SpO2 95%    BMI 27.48 kg/m   Appears  well nourished, well groomed  and in no distress.  Eyes: PERRLA, EOMs, conjunctiva no swelling or erythema. Sinuses: No frontal/maxillary tenderness ENT/Mouth: EAC's clear, TM's nl w/o erythema, bulging. Nares clear w/o erythema, swelling, exudates. Oropharynx clear without erythema or exudates. Oral hygiene is good. Tongue normal, non obstructing. Hearing intact.  Neck: Supple. Thyroid not palpable. Car 2+/2+ without bruits, nodes or JVD. Chest: Respirations nl with BS clear & equal w/o rales, rhonchi, wheezing or stridor.  Cor: Heart sounds normal w/ regular rate and rhythm without  sig. murmurs, gallops, clicks or rubs. Peripheral pulses normal and equal  without edema.  Abdomen: Soft & bowel sounds normal. Non-tender w/o guarding, rebound, hernias, masses or organomegaly.  Lymphatics: Unremarkable.  Musculoskeletal: Full ROM all peripheral extremities, joint stability, 5/5 strength and normal gait.  Skin: Warm, dry without exposed rashes, lesions or ecchymosis apparent.  Neuro: Cranial nerves intact, reflexes equal bilaterally. Sensory-motor testing grossly intact. Tendon reflexes grossly intact.  Pysch: Alert & oriented x 3.  Insight and judgement nl & appropriate.  No ideations.  Assessment and Plan:  1. Essential hypertension  - Continue medication, monitor blood pressure at home.  - Continue DASH diet.  Reminder to go to the ER if any CP,  SOB, nausea, dizziness, severe HA, changes vision/speech.   - CBC with Differential/Platelet - COMPLETE METABOLIC PANEL WITH GFR - Magnesium - TSH  2. Hyperlipidemia associated with type 2 diabetes mellitus ([place])  - Continue diet/meds, exercise,& lifestyle modifications.  - Continue monitor periodic cholesterol/liver & renal functions    - Lipid panel - TSH  3. Type 2 diabetes mellitus with stage 2 chronic kidney  disease, without long-term current use of insulin ([facility])  - Continue diet, exercise  - Lifestyle modifications.  - Monitor appropriate labs   - Hemoglobin A1c - Insulin, random  4. Vitamin D deficiency  - Continue supplementation   - VITAMIN D 25 Hydroxy  5. Abdominal aortic atherosclerosis ([place]) by Abd CT scan 2018   - Lipid panel  6. Idiopathic gout, unspecified chronicity, unspecified site  - Uric acid  7. Medication management  - CBC with Differential/Platelet - COMPLETE METABOLIC PANEL WITH GFR - Magnesium - Lipid panel - TSH - Hemoglobin A1c - Insulin, random - VITAMIN D 25 Hydroxy - Uric acid  8. Need for immunization against influenza  - Flu vaccine HIGH DOSE PF (Fluzone  High dose)         Discussed  regular exercise, BP monitoring, weight control to achieve/maintain BMI less than 25 and discussed med and SE's. Recommended labs to assess and monitor clinical status with further disposition pending results of labs.  I discussed the assessment and treatment plan with the patient. The patient was provided an opportunity to ask questions and all were answered. The patient agreed with the plan and demonstrated an understanding of the instructions.  I provided over 30 minutes of exam, counseling, chart review and  complex critical decision making.        The patient was advised to call back or seek an in-person evaluation if the symptoms worsen or if the condition fails to improve as anticipated.   Kirtland Bouchard, MD

## 2021-09-03 ENCOUNTER — Encounter: Payer: Self-pay | Admitting: Internal Medicine

## 2021-09-03 ENCOUNTER — Ambulatory Visit (INDEPENDENT_AMBULATORY_CARE_PROVIDER_SITE_OTHER): Payer: BC Managed Care – PPO | Admitting: Internal Medicine

## 2021-09-03 ENCOUNTER — Other Ambulatory Visit: Payer: Self-pay

## 2021-09-03 VITALS — BP 128/80 | HR 71 | Temp 97.9°F | Resp 16 | Ht 75.5 in | Wt 222.8 lb

## 2021-09-03 DIAGNOSIS — M1000 Idiopathic gout, unspecified site: Secondary | ICD-10-CM

## 2021-09-03 DIAGNOSIS — N182 Chronic kidney disease, stage 2 (mild): Secondary | ICD-10-CM

## 2021-09-03 DIAGNOSIS — Z79899 Other long term (current) drug therapy: Secondary | ICD-10-CM

## 2021-09-03 DIAGNOSIS — E785 Hyperlipidemia, unspecified: Secondary | ICD-10-CM

## 2021-09-03 DIAGNOSIS — E559 Vitamin D deficiency, unspecified: Secondary | ICD-10-CM

## 2021-09-03 DIAGNOSIS — E1122 Type 2 diabetes mellitus with diabetic chronic kidney disease: Secondary | ICD-10-CM

## 2021-09-03 DIAGNOSIS — I700 Atherosclerosis of aorta: Secondary | ICD-10-CM

## 2021-09-03 DIAGNOSIS — I10 Essential (primary) hypertension: Secondary | ICD-10-CM

## 2021-09-03 DIAGNOSIS — Z23 Encounter for immunization: Secondary | ICD-10-CM | POA: Diagnosis not present

## 2021-09-03 DIAGNOSIS — E1169 Type 2 diabetes mellitus with other specified complication: Secondary | ICD-10-CM

## 2021-09-03 NOTE — Patient Instructions (Signed)

## 2021-09-04 LAB — VITAMIN D 25 HYDROXY (VIT D DEFICIENCY, FRACTURES): Vit D, 25-Hydroxy: 49 ng/mL (ref 30–100)

## 2021-09-04 LAB — CBC WITH DIFFERENTIAL/PLATELET
Absolute Monocytes: 389 cells/uL (ref 200–950)
Basophils Absolute: 30 cells/uL (ref 0–200)
Basophils Relative: 0.5 %
Eosinophils Absolute: 112 cells/uL (ref 15–500)
Eosinophils Relative: 1.9 %
HCT: 40.1 % (ref 38.5–50.0)
Hemoglobin: 13.4 g/dL (ref 13.2–17.1)
Lymphs Abs: 2006 cells/uL (ref 850–3900)
MCH: 29.3 pg (ref 27.0–33.0)
MCHC: 33.4 g/dL (ref 32.0–36.0)
MCV: 87.6 fL (ref 80.0–100.0)
MPV: 11.7 fL (ref 7.5–12.5)
Monocytes Relative: 6.6 %
Neutro Abs: 3363 cells/uL (ref 1500–7800)
Neutrophils Relative %: 57 %
Platelets: 270 10*3/uL (ref 140–400)
RBC: 4.58 10*6/uL (ref 4.20–5.80)
RDW: 13.6 % (ref 11.0–15.0)
Total Lymphocyte: 34 %
WBC: 5.9 10*3/uL (ref 3.8–10.8)

## 2021-09-04 LAB — COMPLETE METABOLIC PANEL WITH GFR
AG Ratio: 1.6 (calc) (ref 1.0–2.5)
ALT: 13 U/L (ref 9–46)
AST: 16 U/L (ref 10–35)
Albumin: 4.6 g/dL (ref 3.6–5.1)
Alkaline phosphatase (APISO): 59 U/L (ref 35–144)
BUN/Creatinine Ratio: 19 (calc) (ref 6–22)
BUN: 26 mg/dL — ABNORMAL HIGH (ref 7–25)
CO2: 22 mmol/L (ref 20–32)
Calcium: 10.2 mg/dL (ref 8.6–10.3)
Chloride: 108 mmol/L (ref 98–110)
Creat: 1.36 mg/dL — ABNORMAL HIGH (ref 0.70–1.35)
Globulin: 2.8 g/dL (calc) (ref 1.9–3.7)
Glucose, Bld: 98 mg/dL (ref 65–99)
Potassium: 4.5 mmol/L (ref 3.5–5.3)
Sodium: 140 mmol/L (ref 135–146)
Total Bilirubin: 0.3 mg/dL (ref 0.2–1.2)
Total Protein: 7.4 g/dL (ref 6.1–8.1)
eGFR: 57 mL/min/{1.73_m2} — ABNORMAL LOW (ref >=60)

## 2021-09-04 LAB — HEMOGLOBIN A1C
Hgb A1c MFr Bld: 7.1 % of total Hgb — ABNORMAL HIGH (ref <5.7)
Mean Plasma Glucose: 157 mg/dL
eAG (mmol/L): 8.7 mmol/L

## 2021-09-04 LAB — LIPID PANEL
Cholesterol: 121 mg/dL (ref <200)
HDL: 28 mg/dL — ABNORMAL LOW (ref >=40)
LDL Cholesterol (Calc): 68 mg/dL (calc)
Non-HDL Cholesterol (Calc): 93 mg/dL (calc) (ref <130)
Total CHOL/HDL Ratio: 4.3 (calc) (ref <5.0)
Triglycerides: 176 mg/dL — ABNORMAL HIGH (ref <150)

## 2021-09-04 LAB — MAGNESIUM: Magnesium: 1.7 mg/dL (ref 1.5–2.5)

## 2021-09-04 LAB — URIC ACID: Uric Acid, Serum: 5.3 mg/dL (ref 4.0–8.0)

## 2021-09-04 LAB — TSH: TSH: 1.71 mIU/L (ref 0.40–4.50)

## 2021-09-04 LAB — INSULIN, RANDOM: Insulin: 14.2 u[IU]/mL

## 2021-09-04 NOTE — Progress Notes (Signed)
============================================================ °============================================================ ° °-    Total Chol = 121   & LDL Chol = 68 - Both  Excellent   - Very low risk for Heart Attack  / Stroke ============================================================ ============================================================  -  Triglycerides (   176   ) or fats in blood are too high  (goal is less than 150)    - Recommend avoid fried & greasy foods,  sweets / candy,   - Avoid white rice  (brown or wild rice or Quinoa is OK),   - Avoid white potatoes  (sweet potatoes are OK)   - Avoid anything made from white flour  - bagels, doughnuts, rolls, buns, biscuits, white and   wheat breads, pizza crust and traditional  pasta made of white flour & egg white  - (vegetarian pasta or spinach or wheat pasta is OK).    - Multi-grain bread is OK - like multi-grain flat bread or  sandwich thins.   - Avoid alcohol in excess.   - Exercise is also important.

## 2021-09-05 NOTE — Progress Notes (Signed)
Spoke to pt and gave him lab results and recommendations, he did not have any questions for the provider or nurse

## 2021-11-05 ENCOUNTER — Other Ambulatory Visit: Payer: Self-pay | Admitting: Adult Health

## 2021-11-05 DIAGNOSIS — I472 Ventricular tachycardia, unspecified: Secondary | ICD-10-CM

## 2021-12-23 ENCOUNTER — Encounter: Payer: BC Managed Care – PPO | Admitting: Internal Medicine

## 2022-01-16 ENCOUNTER — Other Ambulatory Visit: Payer: Self-pay | Admitting: Internal Medicine

## 2022-01-16 DIAGNOSIS — M1000 Idiopathic gout, unspecified site: Secondary | ICD-10-CM

## 2022-03-12 ENCOUNTER — Other Ambulatory Visit: Payer: Self-pay | Admitting: Adult Health

## 2022-03-19 ENCOUNTER — Other Ambulatory Visit: Payer: Self-pay | Admitting: Adult Health

## 2022-03-19 DIAGNOSIS — E782 Mixed hyperlipidemia: Secondary | ICD-10-CM

## 2022-05-05 NOTE — Progress Notes (Deleted)
MEDICARE ANNUAL WELLNESS VISIT AND FOLLOW UP Assessment:   Sean [name] was seen today for follow-up and medicare wellness.  Diagnoses and all orders for this visit:  Encounter for Medicare annual wellness exam Due annually  Health maintenance reviewed ? Colonoscopy status - GI referral was placed - sent message to referral coordinator to follow up on this status  Abdominal aortic atherosclerosis (Sean [name]) by Abd CT scan 2018  Control blood pressure, cholesterol, glucose, increase exercise.   Essential hypertension Continue medication Monitor blood pressure at home; call if consistently over 130/80 Continue DASH diet.   Reminder to go to the ER if any CP, SOB, nausea, dizziness, severe HA, changes vision/speech, left arm numbness and tingling and jaw pain. -     CBC with Differential/Platelet -     COMPLETE METABOLIC PANEL WITH GFR -     Magnesium  Poorly controlled diabetes mellitus (Sean [name]) Anticipate much improved A1C based on fasting Education: Reviewed 'ABCs' of diabetes management (respective goals in parentheses):  A1C (<7), blood pressure (<130/80), and cholesterol (LDL <70) Eye Exam yearly and Dental Exam every 6 months. Dietary recommendations Physical Activity recommendations -     Hemoglobin A1c  Type 2 diabetes mellitus with stage 2 chronic kidney disease, without long-term current use of insulin (Sean [name]) Continue medications: metformin, glipizide  Discussed GLP-1ra if remains significantly above goal  Continue diet and exercise.  Perform daily foot/skin check, notify office of any concerning changes.  Check A1C -     COMPLETE METABOLIC PANEL WITH GFR -     Hemoglobin A1c -     glucose blood test strip; Check blood sugar 1 time daily. DX-E11.22  Hyperlipidemia associated with type 2 diabetes mellitus (Sean [name]) Continue medications; LDL at goal <70 Continue low cholesterol diet and exercise.  -     Lipid panel -     TSH  CKD stage 2 due to type 2 diabetes mellitus  (Sean [name]) Increase fluids, avoid NSAIDS, monitor sugars, will monitor Functions have been stable, low microalbumin Monitor and add low dose ACEi/ARB if needed, declines at this time -     COMPLETE METABOLIC PANEL WITH GFR  Fatty liver disease, nonalcoholic Weight loss with low processed carbohydrate diet recommended Limit tylenol/alcohol Monitor LFT trend and follow up US if trending up -     COMPLETE METABOLIC PANEL WITH GFR  Idiopathic gout, unspecified chronicity, unspecified site Last uric acid very low, no flares in years Discussed possible risk of increased demential with very low uric acid Will try reduced dose allopurinol 150 mg and recheck  Continue lifestyle efforts  Vitamin D deficiency At goal; continue supplement  Overweight (BMI 25.0-29.9) Long discussion about weight loss, diet, and exercise Recommended diet heavy in fruits and veggies and low in animal meats, cheeses, and dairy products, appropriate calorie intake Discussed appropriate weight for height Follow up at next visit  Calculus of gallbladder Denies sx; avoid excess fatty foods, monitor  History of nephrolithiasis Push water intake   Over 30 minutes of exam, counseling, chart review, and critical decision making was performed  Future Appointments  Date Time Provider [place]  05/06/2022 11:00 AM Alycia Rossetti, NP [facility] None     Plan:   During the course of the visit the patient was educated and counseled about appropriate screening and preventive services including:   Pneumococcal vaccine  Influenza vaccine Prevnar 13 Td vaccine Screening electrocardiogram Colorectal cancer screening Diabetes screening Glaucoma screening Nutrition counseling    Subjective:  Sean [name] is  a 66 y.o. male who presents for Medicare Annual Wellness Visit and 3 month follow up for HTN, hyperlipidemia, T2DM with CKD, and vitamin D Def.   Recently in June 2022 he was treated for salivary  gland adenitis that has resolved without residual problems.   BMI is There is no height or weight on file to calculate BMI., he has been working on diet, not intentionally exercising but very active in yard, cutting down trees, etc. 6-7 hours/week.  Wt Readings from Last 3 Encounters:  09/03/21 222 lb 12.8 oz (101.1 kg)  05/06/21 219 lb (99.3 kg)  01/29/21 209 lb (94.8 kg)   His blood pressure has been controlled at home, today their BP is   He does workout. He denies chest pain, shortness of breath, dizziness.   He has aortic atherosclerosis per CT 2018.   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   He is on cholesterol medication (fenofibrate) and denies myalgias. His cholesterol is at goal. The cholesterol last visit was:   Lab Results  Component Value Date   CHOL 121 09/03/2021   HDL 28 (L) 09/03/2021   LDLCALC 68 09/03/2021   TRIG 176 (H) 09/03/2021   CHOLHDL 4.3 09/03/2021   He has been working on diet and exercise for T2DM with CKD II, on metformin 2000 mg daily and denies foot ulcerations, hyperglycemia, hypoglycemia , increased appetite, nausea, paresthesia of the feet, polydipsia, polyuria, and visual disturbances.   He has been working on diet (has cut out bread, had replaced sweets) and exercise for T2DM, has been on metformin 2000 mg daily, also glipizide 5 mg TID with meals. He has been checking fasting (90s-136).  Last A1C in the office was:  Lab Results  Component Value Date   HGBA1C 7.1 (H) 09/03/2021   Last GFR Lab Results  Component Value Date   GFRNONAA 73 12/19/2020   Patient is on Vitamin D supplement.   Lab Results  Component Value Date   VD25OH 49 09/03/2021     Patient is on allopurinol for gout and does report a recent flare.  Lab Results  Component Value Date   LABURIC 5.3 09/03/2021     Medication Review:  Current Outpatient Medications (Endocrine & Metabolic):    glipiZIDE (GLUCOTROL) 5 MG tablet, Take 1/2-1 tablet  3 x /day  with Meals for Diabetes / Patient knows to take by mouth   metFORMIN (GLUCOPHAGE-XR) 500 MG 24 hr tablet, TAKE 2 TABLETS TWICE A DAY WITH MEALS FOR DIABETES  Current Outpatient Medications (Cardiovascular):    atenolol (TENORMIN) 50 MG tablet, TAKE 1 TABLET DAILY FOR HEART RHYTHM   fenofibrate micronized (LOFIBRA) 134 MG capsule, TAKE 1 CAPSULE DAILY TO PREVENT GOUT  Current Outpatient Medications (Respiratory):    montelukast (SINGULAIR) 10 MG tablet, TAKE 1 TABLET DAILY FOR ALLERGIES  Current Outpatient Medications (Analgesics):    allopurinol (ZYLOPRIM) 300 MG tablet, TAKE 1 TABLET DAILY TO PREVENT GOUT   aspirin EC 81 MG tablet, Take 81 mg by mouth at bedtime.   Current Outpatient Medications (Other):    B Complex-C (SUPER B COMPLEX PO), Take 1 tablet by mouth daily.   glucose blood test strip, Check blood sugar 1 time daily. DX-E11.22   Multiple Vitamin (MULTIVITAMIN PO), Take by mouth daily.   Omega-3 Fatty Acids (FISH OIL) 1000 MG CAPS, Take 1,000 mg by mouth 2 (two) times daily.    OVER THE COUNTER MEDICATION, Takes OTC Advil PRN for headache   Vitamin D,  Cholecalciferol, 1000 UNITS TABS, Take 1,000 mg by mouth 2 (two) times daily.   Allergies: Allergies  Allergen Reactions   Levaquin [Levofloxacin]     GI Upset   Valtrex [Valacyclovir Hcl] Hives    Current Problems (verified) has Vitamin D deficiency; Type 2 diabetes mellitus with stage 2 chronic kidney disease, without long-term current use of insulin (Sean [name]); H/O cluster headache; Hyperlipidemia associated with type 2 diabetes mellitus (Sean [name]); Idiopathic gout; Essential hypertension; History of nephrolithiasis; Fatty liver disease, nonalcoholic; Calculus of gallbladder; CKD stage 2 due to type 2 diabetes mellitus (Sean [name]); Overweight (BMI 25.0-29.9); Abdominal aortic atherosclerosis (Sean [name]) by Abd CT scan 2018 ; and Poorly controlled diabetes mellitus (Sean [name]) on their problem list.  Screening Tests Immunization History   Administered Date(s) Administered   Influenza Inj Mdck Quad With Preservative 05/20/2017, 06/08/2018   Influenza Split 05/15/2013, 07/17/2014, 06/18/2015   Influenza, High Dose Seasonal PF 10/02/2019, 07/16/2020, 09/03/2021   Influenza, Seasonal, Injecte, Preservative Fre 09/08/2016   PFIZER(Purple Top)SARS-COV-2 Vaccination 11/29/2019, 12/27/2019   PPD Test 07/17/2014, 09/30/2015, 02/02/2017, 06/08/2018   Pneumococcal Conjugate-13 10/02/2019   Pneumococcal Polysaccharide-23 02/07/2016   Tdap 09/30/2015    Preventative care: Last colonoscopy: reports had WNL at age 63, can't recall where to get records, but was referred to Dr. Kinnie Scales by Dr. Oneta Rack, in 12/2020, never heard to schedule. Will send note to referral coordinator to follow up.   Prior vaccinations: TD or Tdap: 2017  Influenza: 07/2020 - get in Oct Pneumococcal: 2017 Prevnar13: 2021 Shingles/Zostavax: declines Covid 19: 2/2, pfizer   Names of Other Physician/Practitioners you currently use: 1. [facility] here for primary care 2. Dr. ? , eye doctor, last visit remote, states will schedule this month 3. Dr. Valma Cava, dentist, last visit 2022, goes q39m, has in 2 weeks  Patient Care Team: Lucky Cowboy, MD as PCP - General (Internal Medicine) Kathleene Hazel, MD as PCP - Cardiology (Cardiology) Ralene Cork, DO as Consulting Physician (Sports Medicine) Fletcher Anon, MD (Inactive) as Consulting Physician (Pediatrics)  Surgical: He  has a past surgical history that includes Toe Surgery; Varicocele excision; and Nasal sinus surgery. Family His family history includes Diabetes in his father; Heart attack (age of onset: 35) in his father; Hypertension in his father. Social history  He reports that he has never smoked. He has never used smokeless tobacco. He reports that he does not drink alcohol and does not use drugs.  MEDICARE WELLNESS OBJECTIVES: Physical  activity:   Cardiac risk factors:   Depression/mood screen:      05/06/2021   10:44 AM  Depression screen PHQ 2/9  Decreased Interest 0  Down, Depressed, Hopeless 0  PHQ - 2 Score 0    ADLs:     05/06/2021   10:42 AM  In your present state of health, do you have any difficulty performing the following activities:  Hearing? 0  Vision? 0  Difficulty concentrating or making decisions? 0  Walking or climbing stairs? 0  Dressing or bathing? 0  Doing errands, shopping? 0     Cognitive Testing  Alert? Yes  Normal Appearance?Yes  Oriented to person? Yes  Place? Yes   Time? Yes  Recall of three objects?  Yes  Can perform simple calculations? Yes  Displays appropriate judgment?Yes  Can read the correct time from a watch face?Yes  EOL planning:     Objective:   There were no vitals filed for this visit.  There is no height or weight  on file to calculate BMI.  General appearance: alert, no distress, WD/WN, male HEENT: normocephalic, sclerae anicteric, TMs pearly, nares patent, no discharge or erythema, pharynx normal Oral cavity: MMM, no lesions Neck: supple, no lymphadenopathy, no thyromegaly, no masses Heart: RRR, normal S1, S2, no murmurs Lungs: CTA bilaterally, no wheezes, rhonchi, or rales Abdomen: +bs, soft, non tender, non distended, no masses, no hepatomegaly, no splenomegaly Musculoskeletal: nontender, no swelling, no obvious deformity Extremities: no edema, no cyanosis, no clubbing Pulses: 2+ symmetric, upper and lower extremities, normal cap refill Neurological: alert, oriented x 3, CN2-12 intact, strength normal upper extremities and lower extremities, sensation normal throughout, DTRs 2+ throughout, no cerebellar signs, gait normal Psychiatric: normal affect, behavior normal, pleasant   Medicare Attestation I have personally reviewed: The patient's medical and social history Their use of alcohol, tobacco or illicit drugs Their current medications and  supplements The patient's functional ability including ADLs,fall risks, home safety risks, cognitive, and hearing and visual impairment Diet and physical activities Evidence for depression or mood disorders  The patient's weight, height, BMI, and visual acuity have been recorded in the chart.  I have made referrals, counseling, and provided education to the patient based on review of the above and I have provided the patient with a written personalized care plan for preventive services.     Alycia Rossetti, NP   05/05/2022

## 2022-05-06 ENCOUNTER — Ambulatory Visit: Payer: BC Managed Care – PPO | Admitting: Nurse Practitioner

## 2022-05-06 DIAGNOSIS — I700 Atherosclerosis of aorta: Secondary | ICD-10-CM

## 2022-05-06 DIAGNOSIS — K760 Fatty (change of) liver, not elsewhere classified: Secondary | ICD-10-CM

## 2022-05-06 DIAGNOSIS — E1169 Type 2 diabetes mellitus with other specified complication: Secondary | ICD-10-CM

## 2022-05-06 DIAGNOSIS — Z87442 Personal history of urinary calculi: Secondary | ICD-10-CM

## 2022-05-06 DIAGNOSIS — Z0000 Encounter for general adult medical examination without abnormal findings: Secondary | ICD-10-CM

## 2022-05-06 DIAGNOSIS — E1165 Type 2 diabetes mellitus with hyperglycemia: Secondary | ICD-10-CM

## 2022-05-06 DIAGNOSIS — E663 Overweight: Secondary | ICD-10-CM

## 2022-05-06 DIAGNOSIS — E1122 Type 2 diabetes mellitus with diabetic chronic kidney disease: Secondary | ICD-10-CM

## 2022-05-06 DIAGNOSIS — Z79899 Other long term (current) drug therapy: Secondary | ICD-10-CM

## 2022-05-06 DIAGNOSIS — E559 Vitamin D deficiency, unspecified: Secondary | ICD-10-CM

## 2022-05-06 DIAGNOSIS — K8020 Calculus of gallbladder without cholecystitis without obstruction: Secondary | ICD-10-CM

## 2022-05-06 DIAGNOSIS — N182 Chronic kidney disease, stage 2 (mild): Secondary | ICD-10-CM

## 2022-05-06 DIAGNOSIS — I10 Essential (primary) hypertension: Secondary | ICD-10-CM

## 2022-05-11 ENCOUNTER — Other Ambulatory Visit: Payer: Self-pay | Admitting: Internal Medicine

## 2022-05-11 DIAGNOSIS — E1122 Type 2 diabetes mellitus with diabetic chronic kidney disease: Secondary | ICD-10-CM

## 2022-07-15 ENCOUNTER — Other Ambulatory Visit: Payer: Self-pay | Admitting: Nurse Practitioner

## 2022-07-15 DIAGNOSIS — E1122 Type 2 diabetes mellitus with diabetic chronic kidney disease: Secondary | ICD-10-CM

## 2022-07-15 DIAGNOSIS — E1121 Type 2 diabetes mellitus with diabetic nephropathy: Secondary | ICD-10-CM

## 2022-12-08 NOTE — Progress Notes (Signed)
MEDICARE ANNUAL WELLNESS VISIT AND FOLLOW UP Assessment:   Sean [name] was seen today for follow-up and medicare wellness.  Diagnoses and all orders for this visit:  Encounter for Medicare annual wellness exam Due annually  Health maintenance reviewed ? Colonoscopy status - GI referral was placed - sent message to referral coordinator to follow up on this status  Abdominal aortic atherosclerosis (HCC) by Abd CT scan 2018  Control blood pressure, cholesterol, glucose, increase exercise.   Essential hypertension Continue medication Monitor blood pressure at home; call if consistently over 130/80 Continue DASH diet.   Reminder to go to the ER if any CP, SOB, nausea, dizziness, severe HA, changes vision/speech, left arm numbness and tingling and jaw pain. -     CBC with Differential/Platelet -     COMPLETE METABOLIC PANEL WITH GFR -     Magnesium  Poorly controlled diabetes mellitus (HCC) Anticipate much improved A1C based on fasting Education: Reviewed 'ABCs' of diabetes management (respective goals in parentheses):  A1C (<7), blood pressure (<130/80), and cholesterol (LDL <70) Eye Exam yearly and Dental Exam every 6 months. Dietary recommendations Physical Activity recommendations -     Hemoglobin A1c  Type 2 diabetes mellitus with stage 2 chronic kidney disease, without long-term current use of insulin (HCC) Continue medications: metformin, glipizide  Discussed GLP-1ra if remains significantly above goal  Continue diet and exercise.  Perform daily foot/skin check, notify office of any concerning changes.  Check A1C -     COMPLETE METABOLIC PANEL WITH GFR -     Hemoglobin A1c -     glucose blood test strip; Check blood sugar 1 time daily. DX-E11.22  Hyperlipidemia associated with type 2 diabetes mellitus (HCC) Continue medications; LDL at goal <70 Continue low cholesterol diet and exercise.  -     Lipid panel -     TSH  CKD stage 2 due to type 2 diabetes mellitus  (HCC) Increase fluids, avoid NSAIDS, monitor sugars, will monitor Functions have been stable, low microalbumin Monitor and add low dose ACEi/ARB if needed, declines at this time -     COMPLETE METABOLIC PANEL WITH GFR  Fatty liver disease, nonalcoholic Weight loss with low processed carbohydrate diet recommended Limit tylenol/alcohol Monitor LFT trend and follow up US if trending up -     COMPLETE METABOLIC PANEL WITH GFR  Idiopathic gout, unspecified chronicity, unspecified site Last uric acid very low, no flares in years Discussed possible risk of increased demential with very low uric acid Will try reduced dose allopurinol 150 mg and recheck  Continue lifestyle efforts  Vitamin D deficiency At goal; continue supplement  Overweight (BMI 25.0-29.9) Long discussion about weight loss, diet, and exercise Recommended diet heavy in fruits and veggies and low in animal meats, cheeses, and dairy products, appropriate calorie intake Discussed appropriate weight for height Follow up at next visit  Calculus of gallbladder Denies sx; avoid excess fatty foods, monitor  History of nephrolithiasis Push water intake   Over 30 minutes of exam, counseling, chart review, and critical decision making was performed  Future Appointments  Date Time Provider Department Center  12/09/2022  3:00 PM Raynelle DickWilkinson, Joselynne Killam E, NP [facility] None  12/09/2023  2:00 PM Raynelle DickWilkinson, Mckynleigh Mussell E, NP [facility] None     Plan:   During the course of the visit the patient was educated and counseled about appropriate screening and preventive services including:   Pneumococcal vaccine  Influenza vaccine Prevnar 13 Td vaccine Screening electrocardiogram Colorectal cancer screening Diabetes screening Glaucoma  screening Nutrition counseling    Subjective:  Sean [name] is a 67 y.o. male who presents for Medicare Annual Wellness Visit and 3 month follow up for HTN, hyperlipidemia, T2DM with CKD, and vitamin D  Def.   Recently in June 2022 he was treated for salivary gland adenitis that has resolved without residual problems.   BMI is There is no height or weight on file to calculate BMI., he has been working on diet, not intentionally exercising but very active in yard, cutting down trees, etc. 6-7 hours/week.  Wt Readings from Last 3 Encounters:  09/03/21 222 lb 12.8 oz (101.1 kg)  05/06/21 219 lb (99.3 kg)  01/29/21 209 lb (94.8 kg)   His blood pressure has been controlled at home, today their BP is   He does workout. He denies chest pain, shortness of breath, dizziness.   He has aortic atherosclerosis per CT 2018.   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   He is on cholesterol medication (fenofibrate) and denies myalgias. His cholesterol is at goal. The cholesterol last visit was:   Lab Results  Component Value Date   CHOL 121 09/03/2021   HDL 28 (L) 09/03/2021   LDLCALC 68 09/03/2021   TRIG 176 (H) 09/03/2021   CHOLHDL 4.3 09/03/2021   He has been working on diet and exercise for T2DM with CKD II, on metformin 2000 mg daily and denies foot ulcerations, hyperglycemia, hypoglycemia , increased appetite, nausea, paresthesia of the feet, polydipsia, polyuria, and visual disturbances.   He has been working on diet (has cut out bread, had replaced sweets) and exercise for T2DM, has been on metformin 2000 mg daily, also glipizide 5 mg TID with meals. He has been checking fasting (90s-136).  Last A1C in the office was:  Lab Results  Component Value Date   HGBA1C 7.1 (H) 09/03/2021   Last GFR Lab Results  Component Value Date   GFRNONAA 73 12/19/2020   Patient is on Vitamin D supplement.   Lab Results  Component Value Date   VD25OH 49 09/03/2021     Patient is on allopurinol for gout and does report a recent flare.  Lab Results  Component Value Date   LABURIC 5.3 09/03/2021     Medication Review:  Current Outpatient Medications (Endocrine & Metabolic):     glipiZIDE (GLUCOTROL) 5 MG tablet, TAKE ONE-HALF (1/2) TO 1 TABLET THREE TIMES A DAY WITH MEALS FOR DIABETES   metFORMIN (GLUCOPHAGE-XR) 500 MG 24 hr tablet, TAKE 2 TABLETS TWICE A DAY WITH MEALS FOR DIABETES  Current Outpatient Medications (Cardiovascular):    atenolol (TENORMIN) 50 MG tablet, TAKE 1 TABLET DAILY FOR HEART RHYTHM   fenofibrate micronized (LOFIBRA) 134 MG capsule, TAKE 1 CAPSULE DAILY TO PREVENT GOUT  Current Outpatient Medications (Respiratory):    montelukast (SINGULAIR) 10 MG tablet, TAKE 1 TABLET DAILY FOR ALLERGIES  Current Outpatient Medications (Analgesics):    allopurinol (ZYLOPRIM) 300 MG tablet, TAKE 1 TABLET DAILY TO PREVENT GOUT   aspirin EC 81 MG tablet, Take 81 mg by mouth at bedtime.   Current Outpatient Medications (Other):    B Complex-C (SUPER B COMPLEX PO), Take 1 tablet by mouth daily.   glucose blood test strip, Check blood sugar 1 time daily. DX-E11.22   Multiple Vitamin (MULTIVITAMIN PO), Take by mouth daily.   Omega-3 Fatty Acids (FISH OIL) 1000 MG CAPS, Take 1,000 mg by mouth 2 (two) times daily.    OVER THE COUNTER MEDICATION, Takes OTC  Advil PRN for headache   Vitamin D, Cholecalciferol, 1000 UNITS TABS, Take 1,000 mg by mouth 2 (two) times daily.   Allergies: Allergies  Allergen Reactions   Levaquin [Levofloxacin]     GI Upset   Valtrex [Valacyclovir Hcl] Hives    Current Problems (verified) has Vitamin D deficiency; Type 2 diabetes mellitus with stage 2 chronic kidney disease, without long-term current use of insulin; H/O cluster headache; Hyperlipidemia associated with type 2 diabetes mellitus; Idiopathic gout; Essential hypertension; History of nephrolithiasis; Fatty liver disease, nonalcoholic; Calculus of gallbladder; CKD stage 2 due to type 2 diabetes mellitus; Overweight (BMI 25.0-29.9); Abdominal aortic atherosclerosis (HCC) by Abd CT scan 2018 ; and Poorly controlled diabetes mellitus on their problem list.  Screening  Tests Immunization History  Administered Date(s) Administered   Influenza Inj Mdck Quad With Preservative 05/20/2017, 06/08/2018   Influenza Split 05/15/2013, 07/17/2014, 06/18/2015   Influenza, High Dose Seasonal PF 10/02/2019, 07/16/2020, 09/03/2021   Influenza, Seasonal, Injecte, Preservative Fre 09/08/2016   PFIZER(Purple Top)SARS-COV-2 Vaccination 11/29/2019, 12/27/2019   PPD Test 07/17/2014, 09/30/2015, 02/02/2017, 06/08/2018   Pneumococcal Conjugate-13 10/02/2019   Pneumococcal Polysaccharide-23 02/07/2016   Tdap 09/30/2015    Preventative care: Last colonoscopy: reports had WNL at age 71, can't recall where to get records, but was referred to Dr. Kinnie Scales by Dr. Oneta Rack, in 12/2020, never heard to schedule. Will send note to referral coordinator to follow up.   Prior vaccinations: TD or Tdap: 2017  Influenza: 07/2020 - get in Oct Pneumococcal: 2017 Prevnar13: 2021 Shingles/Zostavax: declines Covid 19: 2/2, pfizer   Names of Other Physician/Practitioners you currently use: 1. [facility] here for primary care 2. Dr. ? , eye doctor, last visit remote, states will schedule this month 3. Dr. Valma Cava, dentist, last visit 2022, goes q87m, has in 2 weeks  Patient Care Team: Lucky Cowboy, MD as PCP - General (Internal Medicine) Kathleene Hazel, MD as PCP - Cardiology (Cardiology) Ralene Cork, DO as Consulting Physician (Sports Medicine) Fletcher Anon, MD (Inactive) as Consulting Physician (Pediatrics)  Surgical: He  has a past surgical history that includes Toe Surgery; Varicocele excision; and Nasal sinus surgery. Family His family history includes Diabetes in his father; Heart attack (age of onset: 50) in his father; Hypertension in his father. Social history  He reports that he has never smoked. He has never used smokeless tobacco. He reports that he does not drink alcohol and does not use drugs.  MEDICARE WELLNESS  OBJECTIVES: Physical activity:   Cardiac risk factors:   Depression/mood screen:      05/06/2021   10:44 AM  Depression screen PHQ 2/9  Decreased Interest 0  Down, Depressed, Hopeless 0  PHQ - 2 Score 0    ADLs:      No data to display           Cognitive Testing  Alert? Yes  Normal Appearance?Yes  Oriented to person? Yes  Place? Yes   Time? Yes  Recall of three objects?  Yes  Can perform simple calculations? Yes  Displays appropriate judgment?Yes  Can read the correct time from a watch face?Yes  EOL planning:     Objective:   There were no vitals filed for this visit.  There is no height or weight on file to calculate BMI.  General appearance: alert, no distress, WD/WN, male HEENT: normocephalic, sclerae anicteric, TMs pearly, nares patent, no discharge or erythema, pharynx normal Oral cavity: MMM, no lesions Neck: supple, no  lymphadenopathy, no thyromegaly, no masses Heart: RRR, normal S1, S2, no murmurs Lungs: CTA bilaterally, no wheezes, rhonchi, or rales Abdomen: +bs, soft, non tender, non distended, no masses, no hepatomegaly, no splenomegaly Musculoskeletal: nontender, no swelling, no obvious deformity Extremities: no edema, no cyanosis, no clubbing Pulses: 2+ symmetric, upper and lower extremities, normal cap refill Neurological: alert, oriented x 3, CN2-12 intact, strength normal upper extremities and lower extremities, sensation normal throughout, DTRs 2+ throughout, no cerebellar signs, gait normal Psychiatric: normal affect, behavior normal, pleasant   Medicare Attestation I have personally reviewed: The patient's medical and social history Their use of alcohol, tobacco or illicit drugs Their current medications and supplements The patient's functional ability including ADLs,fall risks, home safety risks, cognitive, and hearing and visual impairment Diet and physical activities Evidence for depression or mood disorders  The patient's weight,  height, BMI, and visual acuity have been recorded in the chart.  I have made referrals, counseling, and provided education to the patient based on review of the above and I have provided the patient with a written personalized care plan for preventive services.     Raynelle Dick, NP   12/08/2022

## 2022-12-09 ENCOUNTER — Ambulatory Visit (INDEPENDENT_AMBULATORY_CARE_PROVIDER_SITE_OTHER): Payer: Medicare HMO | Admitting: Nurse Practitioner

## 2022-12-09 ENCOUNTER — Encounter: Payer: Self-pay | Admitting: Nurse Practitioner

## 2022-12-09 VITALS — BP 108/72 | HR 82 | Temp 97.7°F | Ht 75.5 in | Wt 210.0 lb

## 2022-12-09 DIAGNOSIS — E559 Vitamin D deficiency, unspecified: Secondary | ICD-10-CM

## 2022-12-09 DIAGNOSIS — E785 Hyperlipidemia, unspecified: Secondary | ICD-10-CM

## 2022-12-09 DIAGNOSIS — I10 Essential (primary) hypertension: Secondary | ICD-10-CM

## 2022-12-09 DIAGNOSIS — N183 Chronic kidney disease, stage 3 unspecified: Secondary | ICD-10-CM | POA: Diagnosis not present

## 2022-12-09 DIAGNOSIS — E663 Overweight: Secondary | ICD-10-CM

## 2022-12-09 DIAGNOSIS — N1831 Chronic kidney disease, stage 3a: Secondary | ICD-10-CM

## 2022-12-09 DIAGNOSIS — Z0000 Encounter for general adult medical examination without abnormal findings: Secondary | ICD-10-CM

## 2022-12-09 DIAGNOSIS — E1169 Type 2 diabetes mellitus with other specified complication: Secondary | ICD-10-CM | POA: Diagnosis not present

## 2022-12-09 DIAGNOSIS — Z79899 Other long term (current) drug therapy: Secondary | ICD-10-CM | POA: Diagnosis not present

## 2022-12-09 DIAGNOSIS — E1165 Type 2 diabetes mellitus with hyperglycemia: Secondary | ICD-10-CM

## 2022-12-09 DIAGNOSIS — M1000 Idiopathic gout, unspecified site: Secondary | ICD-10-CM

## 2022-12-09 DIAGNOSIS — K8020 Calculus of gallbladder without cholecystitis without obstruction: Secondary | ICD-10-CM

## 2022-12-09 DIAGNOSIS — K760 Fatty (change of) liver, not elsewhere classified: Secondary | ICD-10-CM | POA: Diagnosis not present

## 2022-12-09 DIAGNOSIS — E782 Mixed hyperlipidemia: Secondary | ICD-10-CM

## 2022-12-09 DIAGNOSIS — Z0001 Encounter for general adult medical examination with abnormal findings: Secondary | ICD-10-CM | POA: Diagnosis not present

## 2022-12-09 DIAGNOSIS — Z87442 Personal history of urinary calculi: Secondary | ICD-10-CM

## 2022-12-09 DIAGNOSIS — E1121 Type 2 diabetes mellitus with diabetic nephropathy: Secondary | ICD-10-CM

## 2022-12-09 DIAGNOSIS — R6889 Other general symptoms and signs: Secondary | ICD-10-CM | POA: Diagnosis not present

## 2022-12-09 DIAGNOSIS — M25511 Pain in right shoulder: Secondary | ICD-10-CM

## 2022-12-09 DIAGNOSIS — I472 Ventricular tachycardia, unspecified: Secondary | ICD-10-CM

## 2022-12-09 DIAGNOSIS — E1122 Type 2 diabetes mellitus with diabetic chronic kidney disease: Secondary | ICD-10-CM | POA: Diagnosis not present

## 2022-12-09 DIAGNOSIS — I700 Atherosclerosis of aorta: Secondary | ICD-10-CM | POA: Diagnosis not present

## 2022-12-09 DIAGNOSIS — N182 Chronic kidney disease, stage 2 (mild): Secondary | ICD-10-CM

## 2022-12-09 MED ORDER — FENOFIBRATE MICRONIZED 134 MG PO CAPS
ORAL_CAPSULE | ORAL | 3 refills | Status: DC
Start: 2022-12-09 — End: 2023-09-27

## 2022-12-09 MED ORDER — METFORMIN HCL ER 500 MG PO TB24
ORAL_TABLET | ORAL | 3 refills | Status: DC
Start: 2022-12-09 — End: 2023-09-27

## 2022-12-09 MED ORDER — ATENOLOL 50 MG PO TABS
ORAL_TABLET | ORAL | 3 refills | Status: DC
Start: 2022-12-09 — End: 2023-09-27

## 2022-12-09 MED ORDER — ALLOPURINOL 300 MG PO TABS: ORAL_TABLET | ORAL | 3 refills | Status: DC

## 2022-12-09 NOTE — Patient Instructions (Signed)
Rotator Cuff Tear  A rotator cuff tear is a partial or complete tear of the cord-like bands (tendons) that connect muscle to bone in the rotator cuff. The rotator cuff is a group of muscles and tendons that surround the shoulder joint and keep the upper arm bone (humerus) in the shoulder socket. The tear can occur suddenly (acute tear) or can develop over a long period of time (chronic tear). What are the causes? Acute tears may be caused by: A fall, especially on an outstretched arm. Lifting very heavy objects with a jerking motion. Chronic tears may be caused by overuse of the muscles. This may happen in sports, physical work, or activities in which your arm repeatedly moves over your head. What increases the risk? This condition is more likely to occur in: Athletes and workers who frequently use their shoulder or reach over their head. This may include activities such as: Tennis. Baseball and softball. Swimming and rowing. Weight lifting. Construction work. Painting. People who smoke. Older people who have arthritis or poor blood supply. These can make the muscles and tendons weaker. What are the signs or symptoms? Symptoms of this condition depend on the type and severity of the injury: An acute tear may include a sudden tearing feeling, followed by severe pain that goes from your upper shoulder, down your arm, and toward your elbow. A chronic tear includes a gradual weakness and decreased shoulder motion as the pain gets worse. The pain is usually worse at night. Both types may have symptoms such as: Pain that spreads (radiates) from the shoulder to the upper arm. Swelling and tenderness in front of the shoulder. Decreased range of motion. Pain when: Reaching, pulling, or lifting the arm above the head. Lowering the arm from above the head. Not being able to raise your arm out to the side. Difficulty placing the arm behind your back. How is this diagnosed? This condition is  diagnosed with a medical history and physical exam. Imaging tests may also be done, including: X-rays. MRI. Ultrasound. CT or MR arthrogram. During this test, a contrast material is injected into your shoulder, and then images are taken. How is this treated? Treatment for this condition depends on the type and severity of the condition. In less severe cases, treatment may include: Rest. This may be done with a sling that holds the shoulder still (immobilization). Your health care provider may also recommend avoiding activities that involve lifting your arm over your head. Icing the shoulder. Anti-inflammatory medicines, such as aspirin or ibuprofen. Strengthening and stretching exercises. Your health care provider may recommend specific exercises to improve your range of motion and strengthen your shoulder. In more severe cases, treatment may include: Physical therapy. Steroid injections. Surgery. Follow these instructions at home: Managing pain, stiffness, and swelling     If directed, put ice on the injured area. To do this: If you have a removable sling, remove it as told by your health care provider. Put ice in a plastic bag. Place a towel between your skin and the bag. Leave the ice on for 20 minutes, 2-3 times a day. Remove the ice if your skin turns bright red. This is very important. If you cannot feel pain, heat, or cold, you have a greater risk of damage to the area. Raise (elevate) the injured area above the level of your heart while you are lying down. Find a comfortable sleeping position or sleep on a recliner, if available. Move your fingers often to reduce stiffness and   swelling. Once the swelling has gone down, your health care provider may direct you to apply heat to relax the muscles. Use the heat source that your health care provider recommends, such as a moist heat pack or a heating pad. Place a towel between your skin and the heat source. Leave the heat on for  20-30 minutes. Remove the heat if your skin turns bright red. This is especially important if you are unable to feel pain, heat, or cold. You have a greater risk of getting burned. If you have a sling: Wear the sling as told by your health care provider. Remove it only as told by your health care provider. Loosen the sling if your fingers tingle, become numb, or turn cold and blue. Keep the sling clean. If the sling is not waterproof: Do not let it get wet. Cover it with a watertight covering when you take a bath or a shower. Activity Rest your shoulder as told by your health care provider. Return to your normal activities as told by your health care provider. Ask your health care provider what activities are safe for you. Ask your health care provider when it is safe to drive if you have a sling on your arm. Do any exercises or stretches as told by your health care provider. General instructions Take over-the-counter and prescription medicines only as told by your health care provider. Do not use any products that contain nicotine or tobacco, such as cigarettes, e-cigarettes, and chewing tobacco. If you need help quitting, ask your health care provider. Keep all follow-up visits. This is important. Contact a health care provider if: Your pain gets worse. You have new pain in your arm, hands, or fingers. Medicine does not help your pain. Get help right away if: Your arm, hand, or fingers are numb or tingling. Your arm, hand, or fingers are swollen or painful or they turn white or blue. Your hand or fingers on your injured arm are colder than your other hand. Summary A rotator cuff tear is a partial or complete tear of the cord-like bands (tendons) that connect muscle to bone in the rotator cuff. The tear can occur suddenly (acute tear) or can develop over a long period of time (chronic tear). Treatment generally includes rest, anti-inflammatory medicines, and icing. In some cases,  physical therapy and steroid injections may be needed. In severe cases, surgery may be needed. This information is not intended to replace advice given to you by your health care provider. Make sure you discuss any questions you have with your health care provider. Document Revised: 12/20/2019 Document Reviewed: 12/20/2019 [employer] Patient Education  2023 [employer]  

## 2022-12-10 LAB — CBC WITH DIFFERENTIAL/PLATELET
Absolute Monocytes: 525 cells/uL (ref 200–950)
Basophils Absolute: 34 cells/uL (ref 0–200)
Basophils Relative: 0.4 %
Eosinophils Absolute: 275 cells/uL (ref 15–500)
Eosinophils Relative: 3.2 %
HCT: 43.4 % (ref 38.5–50.0)
Hemoglobin: 14.2 g/dL (ref 13.2–17.1)
Lymphs Abs: 2382 cells/uL (ref 850–3900)
MCH: 28.7 pg (ref 27.0–33.0)
MCHC: 32.7 g/dL (ref 32.0–36.0)
MCV: 87.7 fL (ref 80.0–100.0)
MPV: 12.3 fL (ref 7.5–12.5)
Monocytes Relative: 6.1 %
Neutro Abs: 5384 cells/uL (ref 1500–7800)
Neutrophils Relative %: 62.6 %
Platelets: 236 10*3/uL (ref 140–400)
RBC: 4.95 10*6/uL (ref 4.20–5.80)
RDW: 13.1 % (ref 11.0–15.0)
Total Lymphocyte: 27.7 %
WBC: 8.6 10*3/uL (ref 3.8–10.8)

## 2022-12-10 LAB — URINALYSIS, ROUTINE W REFLEX MICROSCOPIC
Bilirubin Urine: NEGATIVE
Glucose, UA: NEGATIVE
Hgb urine dipstick: NEGATIVE
Ketones, ur: NEGATIVE
Leukocytes,Ua: NEGATIVE
Nitrite: NEGATIVE
Protein, ur: NEGATIVE
Specific Gravity, Urine: 1.02 (ref 1.001–1.035)
pH: 5.5 (ref 5.0–8.0)

## 2022-12-10 LAB — COMPLETE METABOLIC PANEL WITH GFR
AG Ratio: 1.7 (calc) (ref 1.0–2.5)
ALT: 12 U/L (ref 9–46)
AST: 12 U/L (ref 10–35)
Albumin: 4.4 g/dL (ref 3.6–5.1)
Alkaline phosphatase (APISO): 89 U/L (ref 35–144)
BUN: 24 mg/dL (ref 7–25)
CO2: 24 mmol/L (ref 20–32)
Calcium: 9.8 mg/dL (ref 8.6–10.3)
Chloride: 107 mmol/L (ref 98–110)
Creat: 0.9 mg/dL (ref 0.70–1.35)
Globulin: 2.6 g/dL (calc) (ref 1.9–3.7)
Glucose, Bld: 109 mg/dL — ABNORMAL HIGH (ref 65–99)
Potassium: 4.7 mmol/L (ref 3.5–5.3)
Sodium: 140 mmol/L (ref 135–146)
Total Bilirubin: 0.4 mg/dL (ref 0.2–1.2)
Total Protein: 7 g/dL (ref 6.1–8.1)
eGFR: 93 mL/min/{1.73_m2} (ref >=60)

## 2022-12-10 LAB — HEMOGLOBIN A1C
Hgb A1c MFr Bld: 10.4 % of total Hgb — ABNORMAL HIGH (ref <5.7)
Mean Plasma Glucose: 252 mg/dL
eAG (mmol/L): 13.9 mmol/L

## 2022-12-10 LAB — LIPID PANEL
Cholesterol: 139 mg/dL (ref <200)
HDL: 32 mg/dL — ABNORMAL LOW (ref >=40)
LDL Cholesterol (Calc): 77 mg/dL (calc)
Non-HDL Cholesterol (Calc): 107 mg/dL (calc) (ref <130)
Total CHOL/HDL Ratio: 4.3 (calc) (ref <5.0)
Triglycerides: 207 mg/dL — ABNORMAL HIGH (ref <150)

## 2022-12-10 LAB — MICROALBUMIN / CREATININE URINE RATIO
Creatinine, Urine: 101 mg/dL (ref 20–320)
Microalb Creat Ratio: 9 mg/g creat (ref <30)
Microalb, Ur: 0.9 mg/dL

## 2023-02-01 DIAGNOSIS — M25511 Pain in right shoulder: Secondary | ICD-10-CM | POA: Diagnosis not present

## 2023-02-02 DIAGNOSIS — M25511 Pain in right shoulder: Secondary | ICD-10-CM | POA: Diagnosis not present

## 2023-02-15 DIAGNOSIS — M7501 Adhesive capsulitis of right shoulder: Secondary | ICD-10-CM | POA: Diagnosis not present

## 2023-02-22 DIAGNOSIS — M7501 Adhesive capsulitis of right shoulder: Secondary | ICD-10-CM | POA: Diagnosis not present

## 2023-02-24 DIAGNOSIS — M7501 Adhesive capsulitis of right shoulder: Secondary | ICD-10-CM | POA: Diagnosis not present

## 2023-03-02 DIAGNOSIS — M7501 Adhesive capsulitis of right shoulder: Secondary | ICD-10-CM | POA: Diagnosis not present

## 2023-03-15 DIAGNOSIS — M7501 Adhesive capsulitis of right shoulder: Secondary | ICD-10-CM | POA: Diagnosis not present

## 2023-03-19 DIAGNOSIS — M7501 Adhesive capsulitis of right shoulder: Secondary | ICD-10-CM | POA: Diagnosis not present

## 2023-03-23 DIAGNOSIS — M7501 Adhesive capsulitis of right shoulder: Secondary | ICD-10-CM | POA: Diagnosis not present

## 2023-03-25 ENCOUNTER — Ambulatory Visit: Payer: Medicare HMO | Admitting: Nurse Practitioner

## 2023-03-31 DIAGNOSIS — M7501 Adhesive capsulitis of right shoulder: Secondary | ICD-10-CM | POA: Diagnosis not present

## 2023-04-19 NOTE — Progress Notes (Unsigned)
FOLLOW UP  Assessment and Plan:   Diabetes - poorly controlled Continue medication: metformin 500 mg 2 tabs BID, glipizide BID Reviewed diet - try 1/2 tab only with low carb/small meals, whole tab with larger meals Low processed carb/starch diet reviewed at length  Continue diet and exercise.  Perform daily foot/skin check, notify office of any concerning changes.  - A1c  Hypertension Well controlled with current medications- atenolol 50 mg every day,   Monitor blood pressure at home; patient to call if consistently greater than 130/80 Continue DASH diet.   Reminder to go to the ER if any CP, SOB, nausea, dizziness, severe HA, changes vision/speech, left arm numbness and tingling and jaw pain.  Overweight with comorbidities Long discussion about weight loss, diet, and exercise Recommended diet heavy in fruits and veggies and low in animal meats, cheeses, and dairy products, appropriate calorie intake Discussed ideal weight for height  Will follow up in 3 months  Type 2 diabetes mellitus with stage 3a chronic kidney disease, without long-term current use of insulin (HCC) Continue medication: metformin 500 mg 2 tabs BID, glipizide BID Reviewed diet - use glipizide only with meals Low processed carb/starch diet reviewed at length  Continue diet and exercise.  Perform daily foot/skin check, notify office of any concerning changes.  Push water throughout the day -     CBC with Differential/Platelet -     COMPLETE METABOLIC PANEL WITH GFR -     Hemoglobin A1C w/out eAG  Abdominal aortic atherosclerosis (HCC) by Abd CT scan 2018  Control blood pressure, weight , cholesterol and blood sugars  Hyperlipidemia associated with type 2 diabetes mellitus (HCC) Continue Fenofibrate 134 mg every day  Continue diet and exercise -     Lipid panel  CKD stage 3 due to type 2 diabetes mellitus (HCC) Increase fluids, avoid NSAIDS, monitor sugars, will monitor  -     CBC with  Differential/Platelet -     COMPLETE METABOLIC PANEL WITH GFR  Vitamin D deficiency Continue Vit D supplementation to maintain value in therapeutic level of 60-100   Medication management -     CBC with Differential/Platelet -     COMPLETE METABOLIC PANEL WITH GFR -     Lipid panel -     Hemoglobin A1C w/out eAG          Continue diet and meds as discussed. Further disposition pending results of labs. Discussed med's effects and SE's.   Over 15 minutes of exam, counseling, chart review, and critical decision making was performed.   Future Appointments  Date Time Provider Department Center  12/09/2023  2:00 PM Raynelle Dick, NP [facility] None    ----------------------------------------------------------------------------------------------------------------------  HPI 67 y.o. male presents for 3 month follow up on T2DM, hyperlipidemia, CKD stage 3, abdominal atherosclerosis, Vit D deficiency   BP well controlled with tenormin 50 mg every day BP Readings from Last 3 Encounters:  04/20/23 110/70  12/09/22 108/72  09/03/21 128/80  Denies headaches, chest pain, shortness of breath and dizziness    BMI is Body mass index is 26.37 kg/m., he has been working on diet and exercise. He is not exercising as much.  Wt Readings from Last 3 Encounters:  04/20/23 213 lb 12.8 oz (97 kg)  12/09/22 210 lb (95.3 kg)  09/03/21 222 lb 12.8 oz (101.1 kg)     He has been working on diet (has cut out bread, had replaced sweets) and exercise for T2DM, has been on metformin 2000  mg daily, glipizide was added and advised to check glucose BID and keep log. He reports has been taking 5 mg glipizide TID with meals in addition to metformin 2000 mg daily. He has been checking fasting (120-180), and has been checking at bedtime (130-140). Typically fasting higher than HS check.  He denies hyperglycemia, increased appetite, nausea, paresthesia of the feet, polydipsia, polyuria and visual  disturbances. Last A1C in the office was:  Lab Results  Component Value Date   HGBA1C 10.4 (H) 12/09/2022   He is drinking a lot of water Lab Results  Component Value Date   EGFR 93 12/09/2022    He is currently on fenofibrate 134 mg every day and working on diet and exercise. He has never needed to take a statin Lab Results  Component Value Date   CHOL 139 12/09/2022   HDL 32 (L) 12/09/2022   LDLCALC 77 12/09/2022   TRIG 207 (H) 12/09/2022   CHOLHDL 4.3 12/09/2022      BMI is Body mass index is 26.37 kg/m., he has been working on diet and exercise, doing boiled eggs for breakfast, salads, broccoli with protein for dinner.  Wt Readings from Last 3 Encounters:  04/20/23 213 lb 12.8 oz (97 kg)  12/09/22 210 lb (95.3 kg)  09/03/21 222 lb 12.8 oz (101.1 kg)     Current Medications:  Current Outpatient Medications on File Prior to Visit  Medication Sig   allopurinol (ZYLOPRIM) 300 MG tablet TAKE 1 TABLET DAILY TO PREVENT GOUT   aspirin EC 81 MG tablet Take 81 mg by mouth at bedtime.   atenolol (TENORMIN) 50 MG tablet TAKE 1 TABLET DAILY FOR HEART RHYTHM   B Complex-C (SUPER B COMPLEX PO) Take 1 tablet by mouth daily.   fenofibrate micronized (LOFIBRA) 134 MG capsule TAKE 1 CAPSULE DAILY TO PREVENT GOUT   glipiZIDE (GLUCOTROL) 5 MG tablet TAKE ONE-HALF (1/2) TO 1 TABLET THREE TIMES A DAY WITH MEALS FOR DIABETES   glucose blood test strip Check blood sugar 1 time daily. DX-E11.22   metFORMIN (GLUCOPHAGE-XR) 500 MG 24 hr tablet TAKE 2 TABLETS TWICE A DAY WITH MEALS FOR DIABETES   montelukast (SINGULAIR) 10 MG tablet TAKE 1 TABLET DAILY FOR ALLERGIES   Multiple Vitamin (MULTIVITAMIN PO) Take by mouth daily.   Omega-3 Fatty Acids (FISH OIL) 1000 MG CAPS Take 1,000 mg by mouth 2 (two) times daily.    OVER THE COUNTER MEDICATION Takes OTC Advil PRN for headache   Vitamin D, Cholecalciferol, 1000 UNITS TABS Take 1,000 mg by mouth 2 (two) times daily.    No current  facility-administered medications on file prior to visit.     Allergies:  Allergies  Allergen Reactions   Levaquin [Levofloxacin]     GI Upset   Valtrex [Valacyclovir Hcl] Hives     Medical History:  Past Medical History:  Diagnosis Date   Calculus of gallbladder with acute cholecystitis 11/08/2014   Via CT AB 2015    Diabetes mellitus without complication (HCC)    Fatty liver disease, nonalcoholic 11/08/2014   Gout 07/17/2014   H/O cluster headache    History of nephrolithiasis 11/08/2014   T2_NIDDM w/CKD2 (GFR 63 ml/min)    Vitamin D deficiency    Family history- Reviewed and unchanged Social history- Reviewed and unchanged   Review of Systems:  Review of Systems  Constitutional:  Negative for malaise/fatigue and weight loss.  HENT:  Negative for hearing loss and tinnitus.   Eyes:  Negative for blurred  vision and double vision.  Respiratory:  Negative for cough, shortness of breath and wheezing.   Cardiovascular:  Negative for chest pain, palpitations, orthopnea, claudication and leg swelling.  Gastrointestinal:  Negative for abdominal pain, blood in stool, constipation, diarrhea, heartburn, melena, nausea and vomiting.  Genitourinary: Negative.   Musculoskeletal:  Negative for joint pain and myalgias.  Skin:  Negative for rash.  Neurological:  Negative for dizziness, tingling, sensory change, weakness and headaches.  Endo/Heme/Allergies:  Negative for polydipsia.  Psychiatric/Behavioral: Negative.    All other systems reviewed and are negative.     Physical Exam: BP 110/70   Pulse 85   Temp (!) 97.3 F (36.3 C)   Ht 6' 3.5" (1.918 m)   Wt 213 lb 12.8 oz (97 kg)   SpO2 96%   BMI 26.37 kg/m  Wt Readings from Last 3 Encounters:  04/20/23 213 lb 12.8 oz (97 kg)  12/09/22 210 lb (95.3 kg)  09/03/21 222 lb 12.8 oz (101.1 kg)   General Appearance: Well nourished, in no apparent distress. Eyes: PERRLA, EOMs, conjunctiva no swelling or erythema Sinuses: No  Frontal/maxillary tenderness ENT/Mouth: Ext aud canals clear, TMs without erythema, bulging. No erythema, swelling, or exudate on post pharynx.  Tonsils not swollen or erythematous. Hearing normal.  Neck: Supple, thyroid normal.  Respiratory: Respiratory effort normal, BS equal bilaterally without rales, rhonchi, wheezing or stridor.  Cardio: RRR with no MRGs. Brisk peripheral pulses without edema.  Abdomen: Soft, + BS.  Non tender, no guarding, rebound, hernias, masses. Lymphatics: Non tender without lymphadenopathy.  Musculoskeletal: Full ROM, 5/5 strength, Normal gait Skin: Warm, dry without rashes, lesions, ecchymosis.  Neuro: Cranial nerves intact. No cerebellar symptoms.  Psych: Awake and oriented X 3, normal affect, Insight and Judgment appropriate.    Raynelle Dick, NP 11:36 AM [facility]

## 2023-04-20 ENCOUNTER — Ambulatory Visit (INDEPENDENT_AMBULATORY_CARE_PROVIDER_SITE_OTHER): Payer: Medicare HMO | Admitting: Nurse Practitioner

## 2023-04-20 ENCOUNTER — Encounter: Payer: Self-pay | Admitting: Nurse Practitioner

## 2023-04-20 VITALS — BP 110/70 | HR 85 | Temp 97.3°F | Ht 75.5 in | Wt 213.8 lb

## 2023-04-20 DIAGNOSIS — N182 Chronic kidney disease, stage 2 (mild): Secondary | ICD-10-CM

## 2023-04-20 DIAGNOSIS — E785 Hyperlipidemia, unspecified: Secondary | ICD-10-CM

## 2023-04-20 DIAGNOSIS — E1122 Type 2 diabetes mellitus with diabetic chronic kidney disease: Secondary | ICD-10-CM | POA: Diagnosis not present

## 2023-04-20 DIAGNOSIS — N183 Chronic kidney disease, stage 3 unspecified: Secondary | ICD-10-CM | POA: Diagnosis not present

## 2023-04-20 DIAGNOSIS — N1831 Chronic kidney disease, stage 3a: Secondary | ICD-10-CM | POA: Diagnosis not present

## 2023-04-20 DIAGNOSIS — E559 Vitamin D deficiency, unspecified: Secondary | ICD-10-CM

## 2023-04-20 DIAGNOSIS — Z79899 Other long term (current) drug therapy: Secondary | ICD-10-CM | POA: Diagnosis not present

## 2023-04-20 DIAGNOSIS — I10 Essential (primary) hypertension: Secondary | ICD-10-CM

## 2023-04-20 DIAGNOSIS — E1169 Type 2 diabetes mellitus with other specified complication: Secondary | ICD-10-CM

## 2023-04-20 DIAGNOSIS — E1165 Type 2 diabetes mellitus with hyperglycemia: Secondary | ICD-10-CM

## 2023-04-20 DIAGNOSIS — E663 Overweight: Secondary | ICD-10-CM | POA: Diagnosis not present

## 2023-04-20 DIAGNOSIS — I700 Atherosclerosis of aorta: Secondary | ICD-10-CM

## 2023-04-20 MED ORDER — GLIPIZIDE 5 MG PO TABS
ORAL_TABLET | ORAL | 0 refills | Status: DC
Start: 2023-04-20 — End: 2023-05-04

## 2023-04-20 MED ORDER — GLUCOSE BLOOD VI STRP
ORAL_STRIP | 3 refills | Status: AC
Start: 2023-04-20 — End: ?

## 2023-04-20 NOTE — Patient Instructions (Signed)

## 2023-04-21 LAB — CBC WITH DIFFERENTIAL/PLATELET
Absolute Monocytes: 468 {cells}/uL (ref 200–950)
Basophils Absolute: 29 {cells}/uL (ref 0–200)
Basophils Relative: 0.4 %
Eosinophils Absolute: 288 {cells}/uL (ref 15–500)
Eosinophils Relative: 4 %
HCT: 44 % (ref 38.5–50.0)
Hemoglobin: 14.5 g/dL (ref 13.2–17.1)
Lymphs Abs: 1994 cells/uL (ref 850–3900)
MCH: 29.4 pg (ref 27.0–33.0)
MCHC: 33 g/dL (ref 32.0–36.0)
MCV: 89.2 fL (ref 80.0–100.0)
MPV: 12.2 fL (ref 7.5–12.5)
Monocytes Relative: 6.5 %
Neutro Abs: 4421 {cells}/uL (ref 1500–7800)
Neutrophils Relative %: 61.4 %
Platelets: 273 10*3/uL (ref 140–400)
RBC: 4.93 10*6/uL (ref 4.20–5.80)
RDW: 13.4 % (ref 11.0–15.0)
Total Lymphocyte: 27.7 %
WBC: 7.2 10*3/uL (ref 3.8–10.8)

## 2023-04-21 LAB — COMPLETE METABOLIC PANEL WITH GFR
AG Ratio: 2 (calc) (ref 1.0–2.5)
ALT: 14 U/L (ref 9–46)
AST: 10 U/L (ref 10–35)
Albumin: 4.7 g/dL (ref 3.6–5.1)
Alkaline phosphatase (APISO): 74 U/L (ref 35–144)
BUN: 23 mg/dL (ref 7–25)
CO2: 24 mmol/L (ref 20–32)
Calcium: 10.1 mg/dL (ref 8.6–10.3)
Chloride: 105 mmol/L (ref 98–110)
Creat: 1.14 mg/dL (ref 0.70–1.35)
Globulin: 2.4 g/dL (ref 1.9–3.7)
Glucose, Bld: 229 mg/dL — ABNORMAL HIGH (ref 65–99)
Potassium: 5.1 mmol/L (ref 3.5–5.3)
Sodium: 137 mmol/L (ref 135–146)
Total Bilirubin: 0.4 mg/dL (ref 0.2–1.2)
Total Protein: 7.1 g/dL (ref 6.1–8.1)
eGFR: 70 mL/min/{1.73_m2} (ref >=60)

## 2023-04-21 LAB — LIPID PANEL
Cholesterol: 120 mg/dL (ref <200)
HDL: 33 mg/dL — ABNORMAL LOW (ref >=40)
LDL Cholesterol (Calc): 63 mg/dL
Non-HDL Cholesterol (Calc): 87 mg/dL (ref <130)
Total CHOL/HDL Ratio: 3.6 (calc) (ref <5.0)
Triglycerides: 163 mg/dL — ABNORMAL HIGH (ref <150)

## 2023-04-21 LAB — HEMOGLOBIN A1C W/OUT EAG: Hgb A1c MFr Bld: 8.9 %{Hb} — ABNORMAL HIGH (ref <5.7)

## 2023-04-28 NOTE — Progress Notes (Signed)
Patient is aware of lab results and instructions. -e welch

## 2023-05-03 ENCOUNTER — Other Ambulatory Visit: Payer: Self-pay | Admitting: Nurse Practitioner

## 2023-05-03 DIAGNOSIS — N182 Chronic kidney disease, stage 2 (mild): Secondary | ICD-10-CM

## 2023-05-03 DIAGNOSIS — E1122 Type 2 diabetes mellitus with diabetic chronic kidney disease: Secondary | ICD-10-CM

## 2023-06-30 ENCOUNTER — Ambulatory Visit (INDEPENDENT_AMBULATORY_CARE_PROVIDER_SITE_OTHER): Payer: Medicare HMO | Admitting: Nurse Practitioner

## 2023-06-30 ENCOUNTER — Encounter: Payer: Self-pay | Admitting: Nurse Practitioner

## 2023-06-30 ENCOUNTER — Other Ambulatory Visit: Payer: Self-pay

## 2023-06-30 VITALS — BP 118/70 | HR 73 | Temp 97.7°F | Ht 75.5 in | Wt 210.0 lb

## 2023-06-30 DIAGNOSIS — J0110 Acute frontal sinusitis, unspecified: Secondary | ICD-10-CM

## 2023-06-30 DIAGNOSIS — I10 Essential (primary) hypertension: Secondary | ICD-10-CM

## 2023-06-30 MED ORDER — AZITHROMYCIN 250 MG PO TABS: ORAL_TABLET | ORAL | 1 refills | Status: DC

## 2023-06-30 NOTE — Progress Notes (Signed)
Assessment and Plan:  Keair was seen today for acute visit.  Diagnoses and all orders for this visit:  Essential hypertension - continue medications, DASH diet, exercise and monitor at home. Call if greater than 130/80.   Acute frontal sinusitis, recurrence not specified Push fluids, Use Mucinex twice a day, Tylenol as needed for pain Zithromax as directed If no improvement in next week notify the office -     azithromycin (ZITHROMAX) 250 MG tablet; Take 2 tablets (500 mg) on  Day 1,  followed by 1 tablet (250 mg) once daily on Days 2 through 5.   .    Further disposition pending results of labs. Discussed med's effects and SE's.   Over 30 minutes of exam, counseling, chart review, and critical decision making was performed.   Future Appointments  Date Time Provider Department Center  07/27/2023  2:00 PM Lucky Cowboy, MD [facility] None  12/09/2023  2:00 PM Raynelle Dick, NP [facility] None    ------------------------------------------------------------------------------------------------------------------   HPI BP 118/70   Pulse 73   Temp 97.7 F (36.5 C)   Ht 6' 3.5" (1.918 m)   Wt 210 lb (95.3 kg)   SpO2 95%   BMI 25.90 kg/m  67 y.o.male presents for headache, sinus pressure, sinus drainage and watery eyes x 2 1/2 weeks.  Has tried [facility] sinus medication with no relief. Denies cough, body aches, nausea, vomiting, diarrhea and fever. He does take singulair daily, was taking saline but seemed to make congestion worse.  BP well controlled with atenolol 50 mg every day  BP Readings from Last 3 Encounters:  06/30/23 118/70  04/20/23 110/70  12/09/22 108/72  Denies headaches, chest pain, shortness of breath and dizziness   BMI is Body mass index is 25.9 kg/m., he has been working on diet and exercise. 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   atenolol (TENORMIN) 50 MG tablet TAKE 1 TABLET DAILY FOR HEART RHYTHM   B Complex-C (SUPER B COMPLEX PO) Take 1 tablet by mouth daily.   fenofibrate micronized (LOFIBRA) 134 MG capsule TAKE 1 CAPSULE DAILY TO PREVENT GOUT   glipiZIDE (GLUCOTROL) 5 MG tablet TAKE ONE-HALF (1/2) TO 1 TABLET THREE TIMES A DAY WITH MEALS FOR DIABETES   glucose blood test strip Check blood sugar 1 time daily. DX-E11.22   metFORMIN (GLUCOPHAGE-XR) 500 MG 24 hr tablet TAKE 2 TABLETS TWICE A DAY WITH MEALS FOR DIABETES   Multiple Vitamin (MULTIVITAMIN PO) Take by mouth daily.   Omega-3 Fatty Acids (FISH OIL) 1000 MG CAPS Take 1,000 mg by mouth 2 (two) times  daily.    OVER THE COUNTER MEDICATION Takes OTC Advil PRN for headache   Vitamin D, Cholecalciferol, 1000 UNITS TABS Take 1,000 mg by mouth 2 (two) times daily.    montelukast (SINGULAIR) 10 MG tablet TAKE 1 TABLET DAILY FOR ALLERGIES (Patient not taking: Reported on 06/30/2023)   No current facility-administered medications on file prior to visit.    ROS: all negative except above.   Physical Exam:  BP 118/70   Pulse 73   Temp 97.7 F (36.5 C)   Ht 6' 3.5" (1.918 m)   Wt 210 lb (95.3 kg)   SpO2 95%   BMI 25.90 kg/m   General Appearance: Well nourished, in no apparent distress. Eyes: PERRLA, EOMs,  conjunctiva no swelling or erythema Sinuses: Positive left frontal tenderness ENT/Mouth: Ext aud canals clear, TMs without erythema, bulging. No erythema, swelling, or exudate on post pharynx.  Tonsils not swollen or erythematous. Hearing normal.  Neck: Supple, thyroid normal.  Respiratory: Respiratory effort normal, BS equal bilaterally without rales, rhonchi, wheezing or stridor.  Cardio: RRR with no MRGs. Brisk peripheral pulses without edema.  Abdomen: Soft, + BS.  Non tender, no guarding, rebound, hernias, masses. Lymphatics: positive left submandibular adenopathy.  Musculoskeletal: Full ROM, 5/5 strength, normal gait.  Skin: Warm, dry without rashes, lesions, ecchymosis.  Neuro: Cranial nerves intact. Normal muscle tone, no cerebellar symptoms. Sensation intact.  Psych: Awake and oriented X 3, normal affect, Insight and Judgment appropriate.     Raynelle Dick, NP 9:56 AM [facility]

## 2023-06-30 NOTE — Patient Instructions (Signed)
Use Mucinex twice a day to thin secretions Zithromax as directed  Sinus Infection, Adult A sinus infection, also called sinusitis, is inflammation of your sinuses. Sinuses are hollow spaces in the bones around your face. Your sinuses are located: Around your eyes. In the middle of your forehead. Behind your nose. In your cheekbones. Mucus normally drains out of your sinuses. When your nasal tissues become inflamed or swollen, mucus can become trapped or blocked. This allows bacteria, viruses, and fungi to grow, which leads to infection. Most infections of the sinuses are caused by a virus. A sinus infection can develop quickly. It can last for up to 4 weeks (acute) or for more than 12 weeks (chronic). A sinus infection often develops after a cold. What are the causes? This condition is caused by anything that creates swelling in the sinuses or stops mucus from draining. This includes: Allergies. Asthma. Infection from bacteria or viruses. Deformities or blockages in your nose or sinuses. Abnormal growths in the nose (nasal polyps). Pollutants, such as chemicals or irritants in the air. Infection from fungi. This is rare. What increases the risk? You are more likely to develop this condition if you: Have a weak body defense system (immune system). Do a lot of swimming or diving. Overuse nasal sprays. Smoke. What are the signs or symptoms? The main symptoms of this condition are pain and a feeling of pressure around the affected sinuses. Other symptoms include: Stuffy nose or congestion that makes it difficult to breathe through your nose. Thick yellow or greenish drainage from your nose. Tenderness, swelling, and warmth over the affected sinuses. A cough that may get worse at night. Decreased sense of smell and taste. Extra mucus that collects in the throat or the back of the nose (postnasal drip) causing a sore throat or bad breath. Tiredness (fatigue). Fever. How is this  diagnosed? This condition is diagnosed based on: Your symptoms. Your medical history. A physical exam. Tests to find out if your condition is acute or chronic. This may include: Checking your nose for nasal polyps. Viewing your sinuses using a device that has a light (endoscope). Testing for allergies or bacteria. Imaging tests, such as an MRI or CT scan. In rare cases, a bone biopsy may be done to rule out more serious types of fungal sinus disease. How is this treated? Treatment for a sinus infection depends on the cause and whether your condition is chronic or acute. If caused by a virus, your symptoms should go away on their own within 10 days. You may be given medicines to relieve symptoms. They include: Medicines that shrink swollen nasal passages (decongestants). A spray that eases inflammation of the nostrils (topical intranasal corticosteroids). Rinses that help get rid of thick mucus in your nose (nasal saline washes). Medicines that treat allergies (antihistamines). Over-the-counter pain relievers. If caused by bacteria, your health care provider may recommend waiting to see if your symptoms improve. Most bacterial infections will get better without antibiotic medicine. You may be given antibiotics if you have: A severe infection. A weak immune system. If caused by narrow nasal passages or nasal polyps, surgery may be needed. Follow these instructions at home: Medicines Take, use, or apply over-the-counter and prescription medicines only as told by your health care provider. These may include nasal sprays. If you were prescribed an antibiotic medicine, take it as told by your health care provider. Do not stop taking the antibiotic even if you start to feel better. Hydrate and humidify  Drink  enough fluid to keep your urine pale yellow. Staying hydrated will help to thin your mucus. Use a cool mist humidifier to keep the humidity level in your home above 50%. Inhale steam for  10-15 minutes, 3-4 times a day, or as told by your health care provider. You can do this in the bathroom while a hot shower is running. Limit your exposure to cool or dry air. Rest Rest as much as possible. Sleep with your head raised (elevated). Make sure you get enough sleep each night. General instructions  Apply a warm, moist washcloth to your face 3-4 times a day or as told by your health care provider. This will help with discomfort. Use nasal saline washes as often as told by your health care provider. Wash your hands often with soap and water to reduce your exposure to germs. If soap and water are not available, use hand sanitizer. Do not smoke. Avoid being around people who are smoking (secondhand smoke). Keep all follow-up visits. This is important. Contact a health care provider if: You have a fever. Your symptoms get worse. Your symptoms do not improve within 10 days. Get help right away if: You have a severe headache. You have persistent vomiting. You have severe pain or swelling around your face or eyes. You have vision problems. You develop confusion. Your neck is stiff. You have trouble breathing. These symptoms may be an emergency. Get help right away. Call 911. Do not wait to see if the symptoms will go away. Do not drive yourself to the hospital. Summary A sinus infection is soreness and inflammation of your sinuses. Sinuses are hollow spaces in the bones around your face. This condition is caused by nasal tissues that become inflamed or swollen. The swelling traps or blocks the flow of mucus. This allows bacteria, viruses, and fungi to grow, which leads to infection. If you were prescribed an antibiotic medicine, take it as told by your health care provider. Do not stop taking the antibiotic even if you start to feel better. Keep all follow-up visits. This is important. This information is not intended to replace advice given to you by your health care provider.  Make sure you discuss any questions you have with your health care provider. Document Revised: 07/22/2021 Document Reviewed: 07/22/2021 Elsevier Patient Education  2024 [employer].

## 2023-07-01 ENCOUNTER — Other Ambulatory Visit: Payer: Self-pay | Admitting: Nurse Practitioner

## 2023-07-01 ENCOUNTER — Telehealth: Payer: Self-pay | Admitting: Nurse Practitioner

## 2023-07-01 DIAGNOSIS — Z9109 Other allergy status, other than to drugs and biological substances: Secondary | ICD-10-CM

## 2023-07-01 MED ORDER — MONTELUKAST SODIUM 10 MG PO TABS
ORAL_TABLET | ORAL | 3 refills | Status: AC
Start: 2023-07-01 — End: ?

## 2023-07-01 NOTE — Telephone Encounter (Signed)
Refill request on [facility]. Pls send to [facility] - [place], [place] - [address]

## 2023-07-27 ENCOUNTER — Encounter: Payer: Self-pay | Admitting: Internal Medicine

## 2023-07-27 ENCOUNTER — Ambulatory Visit (INDEPENDENT_AMBULATORY_CARE_PROVIDER_SITE_OTHER): Payer: Medicare HMO | Admitting: Internal Medicine

## 2023-07-27 VITALS — BP 110/68 | HR 72 | Temp 97.8°F | Ht 75.0 in | Wt 211.2 lb

## 2023-07-27 DIAGNOSIS — Z125 Encounter for screening for malignant neoplasm of prostate: Secondary | ICD-10-CM | POA: Diagnosis not present

## 2023-07-27 DIAGNOSIS — N182 Chronic kidney disease, stage 2 (mild): Secondary | ICD-10-CM | POA: Diagnosis not present

## 2023-07-27 DIAGNOSIS — Z136 Encounter for screening for cardiovascular disorders: Secondary | ICD-10-CM

## 2023-07-27 DIAGNOSIS — I700 Atherosclerosis of aorta: Secondary | ICD-10-CM | POA: Diagnosis not present

## 2023-07-27 DIAGNOSIS — N138 Other obstructive and reflux uropathy: Secondary | ICD-10-CM

## 2023-07-27 DIAGNOSIS — Z1211 Encounter for screening for malignant neoplasm of colon: Secondary | ICD-10-CM

## 2023-07-27 DIAGNOSIS — E785 Hyperlipidemia, unspecified: Secondary | ICD-10-CM | POA: Diagnosis not present

## 2023-07-27 DIAGNOSIS — I10 Essential (primary) hypertension: Secondary | ICD-10-CM | POA: Diagnosis not present

## 2023-07-27 DIAGNOSIS — Z0000 Encounter for general adult medical examination without abnormal findings: Secondary | ICD-10-CM | POA: Diagnosis not present

## 2023-07-27 DIAGNOSIS — Z23 Encounter for immunization: Secondary | ICD-10-CM | POA: Diagnosis not present

## 2023-07-27 DIAGNOSIS — E559 Vitamin D deficiency, unspecified: Secondary | ICD-10-CM | POA: Diagnosis not present

## 2023-07-27 DIAGNOSIS — Z79899 Other long term (current) drug therapy: Secondary | ICD-10-CM

## 2023-07-27 DIAGNOSIS — E1169 Type 2 diabetes mellitus with other specified complication: Secondary | ICD-10-CM | POA: Diagnosis not present

## 2023-07-27 DIAGNOSIS — E1122 Type 2 diabetes mellitus with diabetic chronic kidney disease: Secondary | ICD-10-CM

## 2023-07-27 DIAGNOSIS — Z0001 Encounter for general adult medical examination with abnormal findings: Secondary | ICD-10-CM

## 2023-07-27 DIAGNOSIS — Z8249 Family history of ischemic heart disease and other diseases of the circulatory system: Secondary | ICD-10-CM

## 2023-07-27 DIAGNOSIS — M1000 Idiopathic gout, unspecified site: Secondary | ICD-10-CM

## 2023-07-27 MED ORDER — ZINC 50 MG PO TABS: ORAL_TABLET | ORAL | Status: AC

## 2023-07-27 NOTE — Patient Instructions (Signed)
Due to recent changes in healthcare laws, you may see the results of your imaging and laboratory studies on MyChart before your provider has had a chance to review them.  We understand that in some cases there may be results that are confusing or concerning to you. Not all laboratory results come back in the same time frame and the provider may be waiting for multiple results in order to interpret others.  Please give [place] 48 hours in order for your provider to thoroughly review all the results before contacting the office for clarification of your results.   +++++++++++++++++++++++++++++++  Vit D  & Vit C 1,000 mg   are recommended to help protect  against the Covid-19 and other Corona viruses.    Also it's recommended  to take  Zinc 50 mg  to help  protect against the Covid-19   and best place to get  is also on [email or web address]  and don't pay more than 6-8 cents /pill !  ================================ Coronavirus (COVID-19) Are you at risk?  Are you at risk for the Coronavirus (COVID-19)?  To be considered HIGH RISK for Coronavirus (COVID-19), you have to meet the following criteria:  Traveled to [place], [place], [place], [place] or [place]; or in the [place] to [place], [place], [place]  or [place]; and have fever, cough, and shortness of breath within the last 2 weeks of travel OR Been in close contact with a person diagnosed with COVID-19 within the last 2 weeks and have  fever, cough,and shortness of breath  IF YOU DO NOT MEET THESE CRITERIA, YOU ARE CONSIDERED LOW RISK FOR COVID-19.  What to do if you are HIGH RISK for COVID-19?  If you are having a medical emergency, call 911. Seek medical care right away. Before you go to a doctor's office, urgent care or emergency department,  call ahead and tell them about your recent travel, contact with someone diagnosed with COVID-19   and your symptoms.  You should receive instructions from your physician's office regarding  next steps of care.  When you arrive at healthcare provider, tell the healthcare staff immediately you have returned from  visiting [place], [place], [place], [place] or [place]; or traveled in the [place] to [place], [place],  [place] or [place] in the last two weeks or you have been in close contact with a person diagnosed with  COVID-19 in the last 2 weeks.   Tell the health care staff about your symptoms: fever, cough and shortness of breath. After you have been seen by a medical provider, you will be either: Tested for (COVID-19) and discharged home on quarantine except to seek medical care if  symptoms worsen, and asked to  Stay home and avoid contact with others until you get your results (4-5 days)  Avoid travel on public transportation if possible (such as bus, train, or airplane) or Sent to the Emergency Department by EMS for evaluation, COVID-19 testing  and  possible admission depending on your condition and test results.  What to do if you are LOW RISK for COVID-19?  Reduce your risk of any infection by using the same precautions used for avoiding the common cold or flu:  Wash your hands often with soap and warm water for at least 20 seconds.  If soap and water are not readily available,  use an alcohol-based hand sanitizer with at least 60% alcohol.  If coughing or sneezing, cover your mouth and nose by coughing  or sneezing into the elbow areas of your shirt or coat,  into a tissue or into your sleeve (not your hands). Avoid shaking hands with others and consider head nods or verbal greetings only. Avoid touching your eyes, nose, or mouth with unwashed hands.  Avoid close contact with people who are sick. Avoid places or events with large numbers of people in one location, like concerts or sporting events. Carefully consider travel plans you have or are making. If you are planning any travel outside or inside the [place], visit the CDC's Travelers' Health webpage for  the latest health notices. If you have some symptoms but not all symptoms, continue to monitor at home and seek medical attention  if your symptoms worsen. If you are having a medical emergency, call 911. >>>>>>>>>>>>>>>>>>>>>>>>>>>>>>>>>>>>>>>>>>>>>>>>>>> We Do NOT Approve of [employer] > > > > > > > > > > > > > > > > > > > > > > > > > > > > > > > > > > >  > >    Preventive Care for Adults  A healthy lifestyle and preventive care can promote health and wellness. Preventive health guidelines for men include the following key practices: A routine yearly physical is a good way to check with your health care provider about your health and preventative screening. It is a chance to share any concerns and updates on your health and to receive a thorough exam. Visit your dentist for a routine exam and preventative care every 6 months. Brush your teeth twice a day and floss once a day. Good oral hygiene prevents tooth decay and gum disease. The frequency of eye exams is based on your age, health, family medical history, use of contact lenses, and other factors. Follow your health care provider's recommendations for frequency of eye exams. Eat a healthy diet. Foods such as vegetables, fruits, whole grains, low-fat dairy products, and lean protein foods contain the nutrients you need without too many calories. Decrease your intake of foods high in solid fats, added sugars, and salt. Eat the right amount of calories for you. Get information about a proper diet from your health care provider, if necessary. Regular physical exercise is one of the most important things you can do for your health. Most adults should get at least 150 minutes of moderate-intensity exercise (any activity that increases your heart rate and causes you to sweat) each week. In addition, most adults need muscle-strengthening exercises on 2 or more days a week. Maintain a healthy weight. The body mass index (BMI) is a screening  tool to identify possible weight problems. It provides an estimate of body fat based on height and weight. Your health care provider can find your BMI and can help you achieve or maintain a healthy weight. For adults 20 years and older: A BMI below 18.5 is considered underweight. A BMI of 18.5 to 24.9 is normal. A BMI of 25 to 29.9 is considered overweight. A BMI of 30 and above is considered obese. Maintain normal blood lipids and cholesterol levels by exercising and minimizing your intake of saturated fat. Eat a balanced diet with plenty of fruit and vegetables. Blood tests for lipids and cholesterol should begin at age 73 and be repeated every 5 years. If your lipid or cholesterol levels are high, you are over 50, or you are at high risk for heart disease, you may need your cholesterol levels checked more frequently. Ongoing high lipid and cholesterol levels should be  treated with medicines if diet and exercise are not working. If you smoke, find out from your health care provider how to quit. If you do not use tobacco, do not start. Lung cancer screening is recommended for adults aged 55-80 years who are at high risk for developing lung cancer because of a history of smoking. A yearly low-dose CT scan of the lungs is recommended for people who have at least a 30-pack-year history of smoking and are a current smoker or have quit within the past 15 years. A pack year of smoking is smoking an average of 1 pack of cigarettes a day for 1 year (for example: 1 pack a day for 30 years or 2 packs a day for 15 years). Yearly screening should continue until the smoker has stopped smoking for at least 15 years. Yearly screening should be stopped for people who develop a health problem that would prevent them from having lung cancer treatment. If you choose to drink alcohol, do not have more than 2 drinks per day. One drink is considered to be 12 ounces (355 mL) of beer, 5 ounces (148 mL) of wine, or 1.5 ounces (44  mL) of liquor. Avoid use of street drugs. Do not share needles with anyone. Ask for help if you need support or instructions about stopping the use of drugs. High blood pressure causes heart disease and increases the risk of stroke. Your blood pressure should be checked at least every 1-2 years. Ongoing high blood pressure should be treated with medicines, if weight loss and exercise are not effective. If you are 33-60 years old, ask your health care provider if you should take aspirin to prevent heart disease. Diabetes screening involves taking a blood sample to check your fasting blood sugar level. Testing should be considered at a younger age or be carried out more frequently if you are overweight and have at least 1 risk factor for diabetes. Colorectal cancer can be detected and often prevented. Most routine colorectal cancer screening begins at the age of 8 and continues through age 64. However, your health care provider may recommend screening at an earlier age if you have risk factors for colon cancer. On a yearly basis, your health care provider may provide home test kits to check for hidden blood in the stool. Use of a small camera at the end of a tube to directly examine the colon (sigmoidoscopy or colonoscopy) can detect the earliest forms of colorectal cancer. Talk to your health care provider about this at age 25, when routine screening begins. Direct exam of the colon should be repeated every 5-10 years through age 47, unless early forms of precancerous polyps or small growths are found. Hepatitis C blood testing is recommended for all people born from [birth year] through 1967 and any individual with known risks for hepatitis C. Screening for abdominal aortic aneurysm (AAA)  by ultrasound is recommended for people who have history of high blood pressure or who are current or former smokers. Healthy men should  receive prostate-specific antigen (PSA) blood tests as part of routine cancer screening.  Talk with your health care provider about prostate cancer screening. Testicular cancer screening is  recommended for adult males. Screening includes self-exam, a health care provider exam, and other screening tests. Consult with your health care provider about any symptoms you have or any concerns you have about testicular cancer. Use sunscreen. Apply sunscreen liberally and repeatedly throughout the day. You should seek shade when your shadow is shorter than  you. Protect yourself by wearing long sleeves, pants, a wide-brimmed hat, and sunglasses year round, whenever you are outdoors. Once a month, do a whole-body skin exam, using a mirror to look at the skin on your back. Tell your health care provider about new moles, moles that have irregular borders, moles that are larger than a pencil eraser, or moles that have changed in shape or color. Stay current with required vaccines (immunizations). Influenza vaccine. All adults should be immunized every year. Tetanus, diphtheria, and acellular pertussis (Td, Tdap) vaccine. An adult who has not previously received Tdap or who does not know his vaccine status should receive 1 dose of Tdap. This initial dose should be followed by tetanus and diphtheria toxoids (Td) booster doses every 10 years. Adults with an unknown or incomplete history of completing a 3-dose immunization series with Td-containing vaccines should begin or complete a primary immunization series including a Tdap dose. Adults should receive a Td booster every 10 years. Zoster vaccine. One dose is recommended for adults aged 13 years or older unless certain conditions are present.  PREVNAR - Pneumococcal 13-valent conjugate (PCV13) vaccine. When indicated, a person who is uncertain of his immunization history and has no record of immunization should receive the PCV13 vaccine. An adult aged 84 years or older who has certain medical conditions and has not been previously immunized should receive 1  dose of PCV13 vaccine. This PCV13 should be followed with a dose of pneumococcal polysaccharide (PPSV23) vaccine. The PPSV23 vaccine dose should be obtained 1 or more year(s)after the dose of PCV13 vaccine. An adult aged 34 years or older who has certain medical conditions and previously received 1 or more doses of PPSV23 vaccine should receive 1 dose of PCV13. The PCV13 vaccine dose should be obtained 1 or more years after the last PPSV23 vaccine dose.  PNEUMOVAX - Pneumococcal polysaccharide (PPSV23) vaccine. When PCV13 is also indicated, PCV13 should be obtained first. All adults aged 23 years and older should be immunized. An adult younger than age 23 years who has certain medical conditions should be immunized. Any person who resides in a nursing home or long-term care facility should be immunized. An adult smoker should be immunized. People with an immunocompromised condition and certain other conditions should receive both PCV13 and PPSV23 vaccines. People with human immunodeficiency virus (HIV) infection should be immunized as soon as possible after diagnosis. Immunization during chemotherapy or radiation therapy should be avoided. Routine use of PPSV23 vaccine is not recommended for American Indians, [address], or people younger than 65 years unless there are medical conditions that require PPSV23 vaccine. When indicated, people who have unknown immunization and have no record of immunization should receive PPSV23 vaccine. One-time revaccination 5 years after the first dose of PPSV23 is recommended for people aged 19-64 years who have chronic kidney failure, nephrotic syndrome, asplenia, or immunocompromised conditions. People who received 1-2 doses of PPSV23 before age 27 years should receive another dose of PPSV23 vaccine at age 87 years or later if at least 5 years have passed since the previous dose. Doses of PPSV23 are not needed for people immunized with PPSV23 at or after age 50  years.  Hepatitis A vaccine. Adults who wish to be protected from this disease, have certain high-risk conditions, work with hepatitis A-infected animals, work in hepatitis A research labs, or travel to or work in countries with a high rate of hepatitis A should be immunized. Adults who were previously unvaccinated and who anticipate close contact  with an international adoptee during the first 60 days after arrival in the [place] from a country with a high rate of hepatitis A should be immunized.  Hepatitis B vaccine. Adults should be immunized if they wish to be protected from this disease, have certain high-risk conditions, may be exposed to blood or other infectious body fluids, are household contacts or sex partners of hepatitis B positive people, are clients or workers in certain care facilities, or travel to or work in countries with a high rate of hepatitis B.  Preventive Service / Frequency  Ages 19 and over Blood pressure check. Lipid and cholesterol check. Lung cancer screening. / Every year if you are aged 55-80 years and have a 30-pack-year history of smoking and currently smoke or have quit within the past 15 years. Yearly screening is stopped once you have quit smoking for at least 15 years or develop a health problem that would prevent you from having lung cancer treatment. Fecal occult blood test (FOBT) of stool. You may not have to do this test if you get a colonoscopy every 10 years. Flexible sigmoidoscopy** or colonoscopy.** / Every 5 years for a flexible sigmoidoscopy or every 10 years for a colonoscopy beginning at age 17 and continuing until age 10. Hepatitis C blood test.** / For all people born from [birth year] through 1967 and any individual with known risks for hepatitis C. Abdominal aortic aneurysm (AAA) screening./ Screening current or former smokers or have Hypertension. Skin self-exam. / Monthly. Influenza vaccine. / Every year. Tetanus, diphtheria, and acellular  pertussis (Tdap/Td) vaccine.** / 1 dose of Td every 10 years.  Zoster vaccine.** / 1 dose for adults aged 51 years or older.         Pneumococcal 13-valent conjugate (PCV13) vaccine.   Pneumococcal polysaccharide (PPSV23) vaccine.   Hepatitis A vaccine.** / Consult your health care provider. Hepatitis B vaccine.** / Consult your health care provider. Screening for abdominal aortic aneurysm (AAA)  by ultrasound is recommended for people who have history of high blood pressure or who are current or former smokers. ++++++++++ Recommend Adult Low Dose Aspirin or  coated  Aspirin 81 mg daily  To reduce risk of Colon Cancer 40 %,  Skin Cancer 26 % ,  Malignant Melanoma 46%  and  Pancreatic cancer 60% ++++++++++++++++++++++ Vitamin D goal  is between 70-100.  Please make sure that you are taking your Vitamin D as directed.  It is very important as a natural anti-inflammatory  helping hair, skin, and nails, as well as reducing stroke and heart attack risk.  It helps your bones and helps with mood. It also decreases numerous cancer risks so please take it as directed.  Low Vit D is associated with a 200-300% higher risk for CANCER  and 200-300% higher risk for HEART   ATTACK  &  STROKE.   .....................................Marland Kitchen It is also associated with higher death rate at younger ages,  autoimmune diseases like Rheumatoid arthritis, Lupus, Multiple Sclerosis.    Also many other serious conditions, like depression, Alzheimer's Dementia, infertility, muscle aches, fatigue, fibromyalgia - just to name a few. ++++++++++++++++++++++ Recommend the book "The END of DIETING" by Dr Monico Hoar  & the book "The END of DIABETES " by Dr Monico Hoar At [email or web address] - get book & Audio CD's    Being diabetic has a  300% increased risk for heart attack, stroke, cancer, and alzheimer- type vascular dementia. It is very important that you work harder with diet by  avoiding all foods that are white. Avoid  white rice (brown & wild rice is OK), white potatoes (sweetpotatoes in moderation is OK), White bread or wheat bread or anything made out of white flour like bagels, donuts, rolls, buns, biscuits, cakes, pastries, cookies, pizza crust, and pasta (made from white flour & egg whites) - vegetarian pasta or spinach or wheat pasta is OK. Multigrain breads like Arnold's or [employer], or multigrain sandwich thins or flatbreads.  Diet, exercise and weight loss can reverse and cure diabetes in the early stages.  Diet, exercise and weight loss is very important in the control and prevention of complications of diabetes which affects every system in your body, ie. Brain - dementia/stroke, eyes - glaucoma/blindness, heart - heart attack/heart failure, kidneys - dialysis, stomach - gastric paralysis, intestines - malabsorption, nerves - severe painful neuritis, circulation - gangrene & loss of a leg(s), and finally cancer and Alzheimers.    I recommend avoid fried & greasy foods,  sweets/candy, white rice (brown or wild rice or Quinoa is OK), white potatoes (sweet potatoes are OK) - anything made from white flour - bagels, doughnuts, rolls, buns, biscuits,white and wheat breads, pizza crust and traditional pasta made of white flour & egg white(vegetarian pasta or spinach or wheat pasta is OK).  Multi-grain bread is OK - like multi-grain flat bread or sandwich thins. Avoid alcohol in excess. Exercise is also important.    Eat all the vegetables you want - avoid meat, especially red meat and dairy - especially cheese.  Cheese is the most concentrated form of trans-fats which is the worst thing to clog up our arteries. Veggie cheese is OK which can be found in the fresh produce section at [employer] or [employer] or [employer]  ++++++++++++++++++++++ DASH Eating Plan  DASH stands for "Dietary Approaches to Stop Hypertension."   The DASH eating plan is a healthy eating plan that has been shown to reduce high  blood pressure (hypertension). Additional health benefits may include reducing the risk of type 2 diabetes mellitus, heart disease, and stroke. The DASH eating plan may also help with weight loss. WHAT DO I NEED TO KNOW ABOUT THE DASH EATING PLAN? For the DASH eating plan, you will follow these general guidelines: Choose foods with a percent daily value for sodium of less than 5% (as listed on the food label). Use salt-free seasonings or herbs instead of table salt or sea salt. Check with your health care provider or pharmacist before using salt substitutes. Eat lower-sodium products, often labeled as "lower sodium" or "no salt added." Eat fresh foods. Eat more vegetables, fruits, and low-fat dairy products. Choose whole grains. Look for the word "whole" as the first word in the ingredient list. Choose fish  Limit sweets, desserts, sugars, and sugary drinks. Choose heart-healthy fats. Eat veggie cheese  Eat more home-cooked food and less restaurant, buffet, and fast food. Limit fried foods. Cook foods using methods other than frying. Limit canned vegetables. If you do use them, rinse them well to decrease the sodium. When eating at a restaurant, ask that your food be prepared with less salt, or no salt if possible.                      WHAT FOODS CAN I EAT? Read Dr Francis Dowse Fuhrman's books on The End of Dieting & The End of Diabetes  Grains Whole grain or whole wheat bread. Brown rice. Whole grain or whole wheat pasta. Quinoa, bulgur, and  whole grain cereals. Low-sodium cereals. Corn or whole wheat flour tortillas. Whole grain cornbread. Whole grain crackers. Low-sodium crackers.  Vegetables Fresh or frozen vegetables (raw, steamed, roasted, or grilled). Low-sodium or reduced-sodium tomato and vegetable juices. Low-sodium or reduced-sodium tomato sauce and paste. Low-sodium or reduced-sodium canned vegetables.   Fruits All fresh, canned (in natural juice), or frozen fruits.  Protein  Products  All fish and seafood.  Dried beans, peas, or lentils. Unsalted nuts and seeds. Unsalted canned beans.  Dairy Low-fat dairy products, such as skim or 1% milk, 2% or reduced-fat cheeses, low-fat ricotta or cottage cheese, or plain low-fat yogurt. Low-sodium or reduced-sodium cheeses.  Fats and Oils Tub margarines without trans fats. Light or reduced-fat mayonnaise and salad dressings (reduced sodium). Avocado. Safflower, olive, or canola oils. Natural peanut or almond butter.  Other Unsalted popcorn and pretzels. The items listed above may not be a complete list of recommended foods or beverages. Contact your dietitian for more options.  ++++++++++++++++++++  WHAT FOODS ARE NOT RECOMMENDED? Grains/ White flour or wheat flour White bread. White pasta. White rice. Refined cornbread. Bagels and croissants. Crackers that contain trans fat.  Vegetables  Creamed or fried vegetables. Vegetables in a . Regular canned vegetables. Regular canned tomato sauce and paste. Regular tomato and vegetable juices.  Fruits Dried fruits. Canned fruit in light or heavy syrup. Fruit juice.  Meat and Other Protein Products Meat in general - RED meat & White meat.  Fatty cuts of meat. Ribs, chicken wings, all processed meats as bacon, sausage, bologna, salami, fatback, hot dogs, bratwurst and packaged luncheon meats.  Dairy Whole or 2% milk, cream, half-and-half, and cream cheese. Whole-fat or sweetened yogurt. Full-fat cheeses or blue cheese. Non-dairy creamers and whipped toppings. Processed cheese, cheese spreads, or cheese curds.  Condiments Onion and garlic salt, seasoned salt, table salt, and sea salt. Canned and packaged gravies. Worcestershire sauce. Tartar sauce. Barbecue sauce. Teriyaki sauce. Soy sauce, including reduced sodium. Steak sauce. Fish sauce. Oyster sauce. Cocktail sauce. Horseradish. Ketchup and mustard. Meat flavorings and tenderizers. Bouillon cubes. Hot sauce. Tabasco sauce.  Marinades. Taco seasonings. Relishes.  Fats and Oils Butter, stick margarine, lard, shortening and bacon fat. Coconut, palm kernel, or palm oils. Regular salad dressings.  Pickles and olives. Salted popcorn and pretzels.  The items listed above may not be a complete list of foods and beverages to avoid.

## 2023-07-27 NOTE — Progress Notes (Unsigned)
Sean [name]     ADULT & ADOLESCENT     INTERNAL MEDICINE  Lucky Cowboy, M.D.          Rance Muir, A.NP        Adela Glimpse, F.NP  [facility] [address]  [place], [place]. [address] Telephone [phone] Telefax [phone]  Annual  Screening/Preventative Visit  & Comprehensive Evaluation & Examination   Future Appointments  Date Time Provider Department  07/27/2023                         cpe  2:00 PM Lucky Cowboy, MD [facility]  12/09/2023  2:00 PM Raynelle Dick, NP [facility]  08/10/2024                         cpe  2:00 PM Lucky Cowboy, MD [facility]                                           This very nice 67 y.o.  MWM with HTN, HLD, T2_NIDDM  and Vitamin D Deficiencypresents for a Screening /Preventative Visit & comprehensive evaluation and management of multiple medical co-morbidities.  Patient has Gout controlled on Allopurinol. Abdominal CT scan in 2018 showed Aortic Atherosclerosis.       Patient has hx/o both migraines & cluster HA's in the past for which he had seen Neurologist Dr Anne Hahn (ret) . He reports more recently  having unilateral peri-orbital migraine type HA's 3 to 4 x /week.        HTN predates circa 2010. Patient's BP has been controlled at home.  Today's BP is at goal -  110/68 .   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  Patient denies any cardiac symptoms as chest pain, palpitations, shortness of breath, dizziness or ankle swelling.       Patient's hyperlipidemia is controlled with diet and Fenofibrate. Patient denies myalgias or other medication SE's. 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  Patient denies reactive  hypoglycemic symptoms, visual blurring, diabetic polys or paresthesias. Last A1c was in poor control:  Lab Results  Component Value Date   HGBA1C 8.9 (H) 04/20/2023   Wt Readings from Last 3 Encounters:  07/27/23 211 lb 3.2 oz (95.8 kg)  06/30/23 210 lb (95.3 kg)  04/20/23 213 lb 12.8 oz (97 kg)         Finally, patient has history of Vitamin D Deficiency ("34" /2012) and last vitamin D was sl low (goal 70-100):  Lab Results  Component Value Date   VD25OH 49 07/16/2020     Current Outpatient Medications  Medication Instructions   allopurinol 300 MG tablet TAKE 1 TABLET DAILY    aspirin EC   81 mg Daily at bedtime   atenolol  50 MG tablet TAKE 1 TABLET DAILY    SUPER B COMPLEX  1 tablet   Daily   fenofibrate 134 MG capsule TAKE 1 CAPSULE DAILY  Fish Oil  1,000 mg 2 times daily   glipiZIDE  5 MG tablet TAKE 1/2-1 TABLET 3 x/day   metFORMIN -XR 500 MG  TAKE 2 TABLETS TWICE A DAY   montelukast  10 MG tablet Take 1 tablet daily   Multiple Vitamin  Daily   OTC Advil Takes  PRN for headache    Vitamin D 1,000 mg 2 times daily    Allergies  Allergen Reactions   Levaquin [Levofloxacin]     GI Upset   Valtrex [Valacyclovir Hcl] Hives     Past Medical History:  Diagnosis Date   Calculus of gallbladder with acute cholecystitis 11/08/2014   Via CT AB 2015    Diabetes mellitus without complication (HCC)    Fatty liver disease, nonalcoholic 11/08/2014   Gout 07/17/2014   H/O cluster headache    History of nephrolithiasis 11/08/2014   T2_NIDDM w/CKD2 (GFR 63 ml/min)    Vitamin D deficiency      Health Maintenance  Topic Date Due   COVID-19 Vaccine (1) Never done   OPHTHALMOLOGY EXAM  Never done   COLONOSCOPY (Pts 45-46yrs Insurance coverage will need to be confirmed)  Never done   FOOT EXAM  09/30/2020   URINE MICROALBUMIN  10/01/2020   HEMOGLOBIN A1C  01/13/2021   PNA vac Low Risk Adult (2 of 2 - PPSV23) 02/06/2021   INFLUENZA VACCINE  03/31/2021   TETANUS/TDAP   09/29/2025   Hepatitis C Screening  Completed   HPV VACCINES  Aged Out     Immunization History  Administered Date(s) Administered   Influenza Inj Mdck Quad  05/20/2017, 06/08/2018   Influenza Split 05/15/2013, 07/17/2014, 06/18/2015   Influenza, High Dose 10/02/2019, 07/16/2020   Influenza, 09/08/2016   PPD Test 02/02/2017, 06/08/2018   Pneumococcal -13 10/02/2019   Pneumococcal -23 02/07/2016   Tdap 09/30/2015    Last Colon - 2005  (at age 71 yo) per Dr Kinnie Scales and overdue 10 year f/u in 2015   Past Surgical History:  Procedure Laterality Date   NASAL SINUS SURGERY     TOE SURGERY     VARICOCELE EXCISION       Family History  Problem Relation Age of Onset   Hypertension Father    Diabetes Father    Heart attack Father 26     Social History   Socioeconomic History   Marital status: Married    Spouse name: Not on file   Number of children: 1   Years of education: Not on file   Highest education level: Not on file  Occupational History   Occupation: Biochemist, clinical  Tobacco Use   Smoking status: Never Smoker   Smokeless tobacco: Never Used  Building services engineer Use: Never used  Substance and Sexual Activity   Alcohol use: No   Drug use: No   Sexual activity: Not on file     ROS Constitutional: Denies fever, chills, weight loss/gain, headaches, insomnia,  night sweats or change in appetite. Does c/o fatigue. Eyes: Denies redness, blurred vision, diplopia, discharge, itchy or watery eyes.  ENT: Denies discharge, congestion, post nasal drip, epistaxis, sore throat, earache, hearing loss, dental pain, Tinnitus, Vertigo, Sinus pain or snoring.  Cardio: Denies chest pain, palpitations, irregular heartbeat, syncope, dyspnea, diaphoresis, orthopnea, PND, claudication or edema Respiratory: denies cough, dyspnea, DOE, pleurisy, hoarseness, laryngitis or wheezing.  Gastrointestinal: Denies dysphagia, heartburn, reflux, water brash, pain, cramps, nausea, vomiting,  bloating, diarrhea, constipation, hematemesis, melena, hematochezia, jaundice or hemorrhoids Genitourinary: Denies dysuria,  frequency, urgency, nocturia, hesitancy, discharge, hematuria or flank pain Musculoskeletal: Denies arthralgia, myalgia, stiffness, Jt. Swelling, pain, limp or strain/sprain. Denies Falls. Skin: Denies puritis, rash, hives, warts, acne, eczema or change in skin lesion Neuro: No weakness, tremor, incoordination, spasms, paresthesia or pain Psychiatric: Denies confusion, memory loss or sensory loss. Denies Depression. Endocrine: Denies change in weight, skin, hair change, nocturia, and paresthesia, diabetic polys, visual blurring or hyper / hypo glycemic episodes.  Heme/Lymph: No excessive bleeding, bruising or enlarged lymph nodes.  Physical Exam  BP 110/68   Pulse 72   Temp 97.8 F (36.6 C)   Ht 6\' 3"  (1.905 m)   Wt 211 lb 3.2 oz (95.8 kg)   SpO2 98%   BMI 26.40 kg/m   General Appearance: Well nourished and well groomed and in no apparent distress.  Eyes: PERRLA, EOMs, conjunctiva no swelling or erythema, normal fundi and vessels. Sinuses: No frontal/maxillary tenderness ENT/Mouth: EACs patent / TMs  nl. Nares clear without erythema, swelling, mucoid exudates. Oral hygiene is good. No erythema, swelling, or exudate. Tongue normal, non-obstructing. Tonsils not swollen or erythematous. Hearing normal.  Neck: Supple, thyroid not palpable. No bruits, nodes or JVD. Respiratory: Respiratory effort normal.  BS equal and clear bilateral without rales, rhonci, wheezing or stridor. Cardio: Heart sounds are normal with regular rate and rhythm and no murmurs, rubs or gallops. Peripheral pulses are normal and equal bilaterally without edema. No aortic or femoral bruits. Chest: symmetric with normal excursions and percussion.  Abdomen: Soft, with Nl bowel sounds. Nontender, no guarding, rebound, hernias, masses, or organomegaly.  Lymphatics: Non tender without lymphadenopathy.   Musculoskeletal: Full ROM all peripheral extremities, joint stability, 5/5 strength, and normal gait. Skin: Warm and dry without rashes, lesions, cyanosis, clubbing or  ecchymosis.  Neuro: Cranial nerves intact, reflexes equal bilaterally. Normal muscle tone, no cerebellar symptoms. Sensation intact.  Pysch: Alert and oriented x 3 with normal affect, insight and judgment appropriate.   Assessment and Plan   1. Annual Preventative/Screening Exam    2. Essential hypertension  - EKG 12-Lead - [place], RETROPERITNL ABD,  LTD - Urinalysis, Routine w reflex microscopic - Microalbumin / creatinine urine ratio - CBC with Differential/Platelet - COMPLETE METABOLIC PANEL WITH GFR - Magnesium - TSH   3. Hyperlipidemia associated with type 2 diabetes mellitus (HCC)  - EKG 12-Lead - [place], RETROPERITNL ABD,  LTD - Lipid panel - TSH   4. Type 2 diabetes mellitus with stage 2 chronic kidney                             disease, without long-term current use of insulin (HCC)  - EKG 12-Lead - [place], RETROPERITNL ABD,  LTD - HM DIABETES FOOT EXAM - PR LOW EXTEMITY NEUR EXAM DOCUM - Hemoglobin A1c - Insulin, random   5. Vitamin D deficiency  - VITAMIN D 25 Hydroxy     6. Abdominal aortic atherosclerosis (HCC) by Abd CT scan 2018   - EKG 12-Lead - [place], RETROPERITNL ABD,  LTD - Lipid panel  7. Idiopathic gout  - Uric acid   8. BPH with obstruction/lower urinary tract symptoms  - PSA   9. Prostate cancer screening  - PSA   10. Screening for heart disease  - EKG 12-Lead   11. FHx: heart disease  - EKG 12-Lead - [place], RETROPERITNL ABD,  LTD   12. Screening for AAA (aortic abdominal aneurysm)  - [place], RETROPERITNL ABD,  LTD  13. Medication management  - Urinalysis, Routine w reflex microscopic - Microalbumin / creatinine urine ratio - Uric acid - CBC with Differential/Platelet - COMPLETE METABOLIC PANEL WITH GFR - Magnesium - Lipid panel - TSH - Hemoglobin  A1c - Insulin, random - VITAMIN D 25 Hydroxy   14. Screening for colorectal cancer  - Cologuard          Patient was counseled in prudent diet, weight control to achieve/maintain BMI less than 25, BP monitoring, regular exercise and medications as discussed.  Discussed med effects and SE's. Routine screening labs and tests as requested with regular follow-up as recommended. Over 40 minutes of exam, counseling, chart review and high complex critical decision making was performed   Sean Maw, MD

## 2023-07-28 LAB — URINALYSIS, ROUTINE W REFLEX MICROSCOPIC
Bilirubin Urine: NEGATIVE
Glucose, UA: NEGATIVE
Hgb urine dipstick: NEGATIVE
Ketones, ur: NEGATIVE
Leukocytes,Ua: NEGATIVE
Nitrite: NEGATIVE
Protein, ur: NEGATIVE
Specific Gravity, Urine: 1.015 (ref 1.001–1.035)
pH: 7.5 (ref 5.0–8.0)

## 2023-07-28 LAB — COMPLETE METABOLIC PANEL WITH GFR
AG Ratio: 1.8 (calc) (ref 1.0–2.5)
ALT: 16 U/L (ref 9–46)
AST: 16 U/L (ref 10–35)
Albumin: 4.8 g/dL (ref 3.6–5.1)
Alkaline phosphatase (APISO): 60 U/L (ref 35–144)
BUN: 18 mg/dL (ref 7–25)
CO2: 23 mmol/L (ref 20–32)
Calcium: 10.3 mg/dL (ref 8.6–10.3)
Chloride: 108 mmol/L (ref 98–110)
Creat: 1.13 mg/dL (ref 0.70–1.35)
Globulin: 2.6 g/dL (ref 1.9–3.7)
Glucose, Bld: 85 mg/dL (ref 65–99)
Potassium: 5.1 mmol/L (ref 3.5–5.3)
Sodium: 140 mmol/L (ref 135–146)
Total Bilirubin: 0.5 mg/dL (ref 0.2–1.2)
Total Protein: 7.4 g/dL (ref 6.1–8.1)
eGFR: 70 mL/min/{1.73_m2} (ref >=60)

## 2023-07-28 LAB — INSULIN, RANDOM: Insulin: 3.5 u[IU]/mL

## 2023-07-28 LAB — CBC WITH DIFFERENTIAL/PLATELET
Absolute Lymphocytes: 2296 {cells}/uL (ref 850–3900)
Absolute Monocytes: 532 {cells}/uL (ref 200–950)
Basophils Absolute: 28 {cells}/uL (ref 0–200)
Basophils Relative: 0.4 %
Eosinophils Absolute: 224 {cells}/uL (ref 15–500)
Eosinophils Relative: 3.2 %
HCT: 40.5 % (ref 38.5–50.0)
Hemoglobin: 13.3 g/dL (ref 13.2–17.1)
MCH: 29.4 pg (ref 27.0–33.0)
MCHC: 32.8 g/dL (ref 32.0–36.0)
MCV: 89.4 fL (ref 80.0–100.0)
MPV: 12.2 fL (ref 7.5–12.5)
Monocytes Relative: 7.6 %
Neutro Abs: 3920 {cells}/uL (ref 1500–7800)
Neutrophils Relative %: 56 %
Platelets: 280 10*3/uL (ref 140–400)
RBC: 4.53 10*6/uL (ref 4.20–5.80)
RDW: 13.2 % (ref 11.0–15.0)
Total Lymphocyte: 32.8 %
WBC: 7 10*3/uL (ref 3.8–10.8)

## 2023-07-28 LAB — LIPID PANEL
Cholesterol: 106 mg/dL (ref <200)
HDL: 38 mg/dL — ABNORMAL LOW (ref >=40)
LDL Cholesterol (Calc): 51 mg/dL
Non-HDL Cholesterol (Calc): 68 mg/dL (ref <130)
Total CHOL/HDL Ratio: 2.8 (calc) (ref <5.0)
Triglycerides: 89 mg/dL (ref <150)

## 2023-07-28 LAB — TSH: TSH: 1.35 m[IU]/L (ref 0.40–4.50)

## 2023-07-28 LAB — MICROALBUMIN / CREATININE URINE RATIO
Creatinine, Urine: 79 mg/dL (ref 20–320)
Microalb Creat Ratio: 4 mg/g{creat} (ref <30)
Microalb, Ur: 0.3 mg/dL

## 2023-07-28 LAB — MAGNESIUM: Magnesium: 1.6 mg/dL (ref 1.5–2.5)

## 2023-07-28 LAB — HEMOGLOBIN A1C
Hgb A1c MFr Bld: 7.9 %{Hb} — ABNORMAL HIGH (ref <5.7)
Mean Plasma Glucose: 180 mg/dL
eAG (mmol/L): 10 mmol/L

## 2023-07-28 LAB — PSA: PSA: 0.31 ng/mL (ref <=4.00)

## 2023-07-28 LAB — URIC ACID: Uric Acid, Serum: 4.2 mg/dL (ref 4.0–8.0)

## 2023-07-28 LAB — VITAMIN D 25 HYDROXY (VIT D DEFICIENCY, FRACTURES): Vit D, 25-Hydroxy: 50 ng/mL (ref 30–100)

## 2023-07-29 ENCOUNTER — Encounter: Payer: Self-pay | Admitting: Internal Medicine

## 2023-07-29 ENCOUNTER — Other Ambulatory Visit: Payer: Self-pay | Admitting: Internal Medicine

## 2023-07-29 DIAGNOSIS — E1122 Type 2 diabetes mellitus with diabetic chronic kidney disease: Secondary | ICD-10-CM

## 2023-07-29 MED ORDER — TIRZEPATIDE 2.5 MG/0.5ML ~~LOC~~ SOAJ: SUBCUTANEOUS | 0 refills | Status: AC

## 2023-07-29 NOTE — Progress Notes (Signed)
[] [] [] [] [] [] [] [] [] [] [] [] [] [] [] [] [] [] [] [] [] [] [] [] [] [] [] [] [] [] [] [] [] [] [] [] [] [] [] [] [] ][] [] [] [] [] [] [] [] [] [] [] [] [] [] [] [] [] [] [] [] [] [] [[] [] [] [] []  [] [] [] [] [] [] [] [] [] [] [] [] [] [] [] [] [] [] [] [] [] [] [] [] [] [] [] [] [] [] [] [] [] [] [] [] [] [] [] [] [] ][] [] [] [] [] [] [] [] [] [] [] [] [] [] [] [] [] [] [] [] [] [] [[] [] [] [] []  -Test results slightly outside the reference range are not unusual. If there is anything important, I will review this with you,  otherwise it is considered normal test values.  If you have further questions,  please do not hesitate to contact me at the office or via My Chart.  [] [] [] [] [] [] [] [] [] [] [] [] [] [] [] [] [] [] [] [] [] [] [] [] [] [] [] [] [] [] [] [] [] [] [] [] [] [] [] [] [] ][] [] [] [] [] [] [] [] [] [] [] [] [] [] [] [] [] [] [] [] [] [] [[] [] [] [] []  [] [] [] [] [] [] [] [] [] [] [] [] [] [] [] [] [] [] [] [] [] [] [] [] [] [] [] [] [] [] [] [] [] [] [] [] [] [] [] [] [] ][] [] [] [] [] [] [] [] [] [] [] [] [] [] [] [] [] [] [] [] [] [] [[] [] [] [] []   -  A1c is better, but still too high at  7.9 gm%                                            (( Ideal or Goal is less than 5.7% ))  - So adding a new Rx for [employer]  - a Shot each week ( every 7 days ) and   after 1 month or 4 weeks, when you send in for refills,   will increase the dose every 4 weeks until your A1c gets in control   [] [] [] [] [] [] [] [] [] [] [] [] [] [] [] [] [] [] [] [] [] [] [] [] [] [] [] [] [] [] [] [] [] [] [] [] [] [] [] [] [] ][] [] [] [] [] [] [] [] [] [] [] [] [] [] [] [] [] [] [] [] [] [] [[] [] [] [] []   - Chol = 106  -  Excellent   - Very low risk for Heart Attack  / Stroke  [] [] [] [] [] [] [] [] [] [] [] [] [] [] [] [] [] [] [] [] [] [] [] [] [] [] [] [] [] [] [] [] [] [] [] [] [] [] [] [] [] ][] [] [] [] [] [] [] [] [] [] [] [] [] [] [] [] [] [] [] [] [] [] [[] [] [] [] []   - PSA very low  - No Prostate cancer - Great  [] [] [] [] [] [] [] [] [] [] [] [] [] [] [] [] [] [] [] [] [] [] [] [] [] [] [] [] [] [] [] [] [] [] [] [] [] [] [] [] [] ][] [] [] [] [] [] [] [] [] [] [] [] [] [] [] [] [] [] [] [] [] [] [[] [] [] [] []   - Uric Acid / Gout test is Normal & OK   [] [] [] [] [] [] [] [] [] [] [] [] [] [] [] [] [] [] [] [] [] [] [] [] [] [] [] [] [] [] [] [] [] [] [] [] [] [] [] [] [] ][] [] [] [] [] [] [] [] [] [] [] [] [] [] [] [] [] [] [] [] [] [] [[] [] [] [] []   -  Magnesium = 1.6  is   very  low - goal is betw 2.0 - 2.5,   - So..............Marland Kitchen  Recommend that you take  Magnesium 500 mg tablet 2 x / day with Meals  - also important to eat lots of  leafy green vegetables - spinach   - Kale - collards- greens - okra - asparagus - broccoli - quinoa -   - almonds - black, red, white beans-  peas - green beans  [] [] [] [] [] [] [] [] [] [] [] [] [] [] [] [] [] [] [] [] [] [] [] [] [] [] [] [] [] [] [] [] [] [] [] [] [] [] [] [] [] ][] [] [] [] [] [] [] [] [] [] [] [] [] [] [] [] [] [] [] [] [] [] [[] [] [] [] []   - Vitamin D = 50 is a little LOW  - Vitamin D goal is between 70-100.   - Please make sure that you are taking your Vitamin D as directed.   - It is very important as a natural anti-inflammatory and helping the                            immune system protect against viral infections, like                                                                                   Flu  & the Covid    - Also helps hair, skin, and nails, as well as reducing stroke and heart attack risk.   - It helps your bones  &  and helps with mood.  - It also decreases numerous cancer risks, so please                                                                            take it as  directed.   - Low Vit D is associated with a 200-300% higher risk for CANCER   and 200-300% higher risk for HEART   ATTACK  &  STROKE.    - It is also associated with higher death rate at younger ages,   autoimmune diseases like Rheumatoid arthritis, Lupus, Multiple Sclerosis.     - Also many other serious conditions, like depression, Alzheimer's Dementia                                                                             muscle aches, fatigue, fibromyalgia   [] [] [] [] [] [] [] [] [] [] [] [] [] [] [] [] [] [] [] [] [] [] [] [] [] [] [] [] [] [] [] [] [] [] [] [] [] [] [] [] [] ][] [] [] [] [] [] [] [] [] [] [] [] [] [] [] [] [] [] [] [] [] [] [[] [] [] [] []   - All Else - CBC - Kidneys - Electrolytes - Liver - Magnesium & Thyroid    - all  Normal /  OK  [] [] [] [] [] [] [] [] [] [] [] [] [] [] [] [] [] [] [] [] [] [] [] [] [] [] [] [] [] [] [] [] [] [] [] [] [] [] [] [] [] ][] [] [] [] [] [] [] [] [] [] [] [] [] [] [] [] [] [] [] [] [] [] [[] [] [] [] []

## 2023-09-27 ENCOUNTER — Other Ambulatory Visit: Payer: Self-pay | Admitting: Nurse Practitioner

## 2023-09-27 DIAGNOSIS — E1121 Type 2 diabetes mellitus with diabetic nephropathy: Secondary | ICD-10-CM

## 2023-09-27 DIAGNOSIS — I472 Ventricular tachycardia, unspecified: Secondary | ICD-10-CM

## 2023-09-27 DIAGNOSIS — M1000 Idiopathic gout, unspecified site: Secondary | ICD-10-CM

## 2023-09-27 DIAGNOSIS — E782 Mixed hyperlipidemia: Secondary | ICD-10-CM

## 2023-09-27 DIAGNOSIS — E1122 Type 2 diabetes mellitus with diabetic chronic kidney disease: Secondary | ICD-10-CM

## 2023-11-04 ENCOUNTER — Encounter: Payer: Self-pay | Admitting: *Deleted

## 2023-12-09 ENCOUNTER — Ambulatory Visit: Payer: Medicare HMO | Admitting: Nurse Practitioner

## 2024-08-10 ENCOUNTER — Encounter: Payer: Medicare HMO | Admitting: Internal Medicine

## 2024-09-09 ENCOUNTER — Other Ambulatory Visit: Payer: Self-pay | Admitting: Nurse Practitioner

## 2024-09-09 DIAGNOSIS — Z9109 Other allergy status, other than to drugs and biological substances: Secondary | ICD-10-CM
